# Patient Record
Sex: Female | Born: 1950
Health system: Southern US, Community
[De-identification: ages and names within clinical notes are randomized; demographics above are authoritative.]

## PROBLEM LIST (undated history)

## (undated) DIAGNOSIS — I10 Essential (primary) hypertension: Secondary | ICD-10-CM

## (undated) DIAGNOSIS — E785 Hyperlipidemia, unspecified: Secondary | ICD-10-CM

## (undated) DIAGNOSIS — J45909 Unspecified asthma, uncomplicated: Secondary | ICD-10-CM

## (undated) HISTORY — DX: Unspecified asthma, uncomplicated: J45.909

## (undated) HISTORY — DX: Essential (primary) hypertension: I10

## (undated) HISTORY — DX: Hyperlipidemia, unspecified: E78.5

## (undated) HISTORY — PX: ABDOMINAL HYSTERECTOMY: SHX81

---

## 2003-01-01 ENCOUNTER — Encounter: Payer: Self-pay | Admitting: Emergency Medicine

## 2003-01-01 ENCOUNTER — Emergency Department (HOSPITAL_COMMUNITY): Admission: EM | Admit: 2003-01-01 | Discharge: 2003-01-02 | Payer: Self-pay | Admitting: Emergency Medicine

## 2004-12-10 ENCOUNTER — Emergency Department (HOSPITAL_COMMUNITY): Admission: EM | Admit: 2004-12-10 | Discharge: 2004-12-10 | Payer: Self-pay | Admitting: Emergency Medicine

## 2007-09-11 ENCOUNTER — Emergency Department (HOSPITAL_COMMUNITY): Admission: EM | Admit: 2007-09-11 | Discharge: 2007-09-11 | Payer: Self-pay | Admitting: Emergency Medicine

## 2008-11-19 ENCOUNTER — Ambulatory Visit (HOSPITAL_COMMUNITY): Admission: RE | Admit: 2008-11-19 | Discharge: 2008-11-19 | Payer: Self-pay | Admitting: Orthopaedic Surgery

## 2009-04-26 ENCOUNTER — Ambulatory Visit (HOSPITAL_COMMUNITY): Admission: RE | Admit: 2009-04-26 | Discharge: 2009-04-26 | Payer: Self-pay | Admitting: Orthopaedic Surgery

## 2009-05-14 ENCOUNTER — Encounter: Admission: RE | Admit: 2009-05-14 | Discharge: 2009-07-30 | Payer: Self-pay | Admitting: Orthopaedic Surgery

## 2009-06-16 DIAGNOSIS — I8000 Phlebitis and thrombophlebitis of superficial vessels of unspecified lower extremity: Secondary | ICD-10-CM | POA: Insufficient documentation

## 2009-06-16 DIAGNOSIS — J45909 Unspecified asthma, uncomplicated: Secondary | ICD-10-CM | POA: Insufficient documentation

## 2009-06-16 DIAGNOSIS — R609 Edema, unspecified: Secondary | ICD-10-CM | POA: Insufficient documentation

## 2009-06-16 DIAGNOSIS — G479 Sleep disorder, unspecified: Secondary | ICD-10-CM | POA: Insufficient documentation

## 2009-06-16 DIAGNOSIS — M7750 Other enthesopathy of unspecified foot: Secondary | ICD-10-CM | POA: Insufficient documentation

## 2010-02-13 DIAGNOSIS — B351 Tinea unguium: Secondary | ICD-10-CM | POA: Insufficient documentation

## 2010-02-16 DIAGNOSIS — K5900 Constipation, unspecified: Secondary | ICD-10-CM | POA: Insufficient documentation

## 2011-02-09 LAB — CBC
HCT: 41.7 % (ref 36.0–46.0)
Hemoglobin: 14.3 g/dL (ref 12.0–15.0)
MCHC: 34.3 g/dL (ref 30.0–36.0)
MCV: 89.8 fL (ref 78.0–100.0)
Platelets: 296 10*3/uL (ref 150–400)
RBC: 4.64 MIL/uL (ref 3.87–5.11)
RDW: 14.2 % (ref 11.5–15.5)
WBC: 8.3 10*3/uL (ref 4.0–10.5)

## 2011-02-09 LAB — GLUCOSE, CAPILLARY: Glucose-Capillary: 190 mg/dL — ABNORMAL HIGH (ref 70–99)

## 2011-02-09 LAB — BASIC METABOLIC PANEL
BUN: 13 mg/dL (ref 6–23)
CO2: 29 mEq/L (ref 19–32)
Calcium: 9.6 mg/dL (ref 8.4–10.5)
Chloride: 100 mEq/L (ref 96–112)
Creatinine, Ser: 0.77 mg/dL (ref 0.4–1.2)
GFR calc Af Amer: >60 mL/min (ref >60)
GFR calc non Af Amer: >60 mL/min (ref >60)
Glucose, Bld: 93 mg/dL (ref 70–99)
Potassium: 3.9 mEq/L (ref 3.5–5.1)
Sodium: 139 mEq/L (ref 135–145)

## 2011-03-17 NOTE — Op Note (Signed)
Kelly Shannon, Kelly Shannon           ACCOUNT NO.:  [id number]   MEDICAL RECORD NO.:  [id number]          PATIENT TYPE:  AMB   LOCATION:  SDS                          FACILITY:  [facility]   PHYSICIAN:  Vanita Panda. Magnus Ivan, M.D.DATE OF BIRTH:  03-Aug-1951   DATE OF PROCEDURE:  04/26/2009  DATE OF DISCHARGE:  04/26/2009                               OPERATIVE REPORT   PREOPERATIVE DIAGNOSES:  Right shoulder impingement syndrome and full-  thickness rotator cuff tear.   POSTOPERATIVE DIAGNOSES:  Right shoulder impingement syndrome and full-  thickness rotator cuff tear.   PROCEDURE:  1. Right shoulder arthroscopy with debridement and subacromial      decompression.  2. Right shoulder arthroscopically assisted rotator cuff repair.   FINDINGS:  Full-thickness rotator cuff repair of supraspinatus tendon  and impingement syndrome.   SURGEON:  Vanita Panda. Magnus Ivan, MD   ANESTHESIA:  General.   ANTIBIOTICS:  Ancef 1 g IV.   BLOOD LOSS:  Minimal.   COMPLICATIONS:  None.   INDICATIONS:  Briefly, Ms. 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  She had had problems with right shoulder pain for some  time now.  She then had had a mechanical fall.  I had obtained an MRI  and it did show full-thickness minimally retracted rotator cuff tear.  I  recommended right shoulder arthroscopy with debridement, subacromial  decompression, and rotator cuff repair.  She had to delay this for a  while due to flare-ups of her fibromyalgia, but now presents to have  intervention of the shoulder.  This has been a quite painful shoulder to  her with limited motion as well and I felt that she was potentially  developing frozen shoulder, but this is difficult to tell in somewhat  with chronic pain.  The risks and benefits of surgery explained to her  in full length and she understood and agreed to proceed with  surgery.   DESCRIPTION OF PROCEDURE:  After informed consent was obtained, the  appropriate right shoulder was marked.  Anesthesia was obtained with  regional right shoulder block.  She was then brought to the operating  room and placed supine on the operating table.  General anesthesia was  then obtained.  She was then fashioned to a beach-chair position with  appropriate bending at the waist and knees.  There was appropriate  positioning of the head and neck and padding of the down nonoperative  left arm.  Her right arm was then prepped and draped with DuraPrep and  sterile drapes and placed in an in-line skeletal traction with 45  degrees of forward flexion and neutral rotation.  The 10- pounds of  traction were applied using the fishing pole traction device.  A time-  out was called and she was identified as the correct patient and correct  right shoulder.  I then made a posterolateral incision off the  posterolateral edge of the acromion, and the arthroscopic cannula was  inserted and the glenohumeral joint was entered.  You could right away  see  that there was undersurface tearing of the rotator cuff and it  looked like there was almost a full-thickness tear from the glenohumeral  joint.  There was frayed tissue around the labrum and on the anterior  aspect her shoulder inflamed tissue.  An anterior portal was then made  lateral to the coracoid process and soft tissue ablation was inserted.  I carried out a thorough debridement within the glenohumeral joint.  Next, I entered the subacromial space with the posterior portal and made  a separate lateral portal.  After using the soft tissue ablation wand  cleaning out the bursa, you could see that there was a full-thickness  rotator cuff tear with some 2-mm retraction.  At that standpoint, I  decided to perform an arthroscopic rotator cuff repair.  A second  lateral incision was made for suture management.  Two arthroscopic  cannulas  were inserted.  I then used a high-speed bur to clean off the  footprint of the rotator cuff and to get bleeding bone for good bleeding  base for the repair.  Next, a 5.5-mm Arthrex suture anchor was placed at  the greater tuberosity of the foot at the area of the footprint of the  rotator cuff.  Two strands of FiberWire suture came out from this.  Using the scorpion suture passer, I then passed 2 horizontal mattress  sutures through the front of the cuff and 2 through the posterior aspect  of cuff to have a stable construct.  I then tied these down with sliding  knot format.  I then placed a supplemental push lock in this subdeltoid  area of the proximal humerus to perform a double row technique to  supplement my repair.  I could easily visualize the shoulder through  internal and external rotation and the rotator cuff was lying down taut  to the bone.  I probed this as well and it was again found to be taut.  I then used a high-speed bur to perform a subacromial decompression with  giving adequate space for the shoulder in the rotator cuff to mobilize.  I then removed all instrumentation and closed all portal sites with  interrupted 3-0 nylon suture.  Xeroform followed by well-padded sterile  dressing was applied and the patient's arm was placed in a shoulder  abduction pillow sling.  She was then awakened, extubated, and taken to  the recovery room in stable condition.  All final counts were correct  and there were no complications noted.      Vanita Panda. Magnus Ivan, M.D.  Electronically Signed     CYB/MEDQ  D:  04/26/2009  T:  04/26/2009  Job:  [id number]

## 2011-08-11 DIAGNOSIS — M791 Myalgia, unspecified site: Secondary | ICD-10-CM | POA: Insufficient documentation

## 2011-08-11 DIAGNOSIS — I10 Essential (primary) hypertension: Secondary | ICD-10-CM | POA: Insufficient documentation

## 2011-08-11 DIAGNOSIS — F315 Bipolar disorder, current episode depressed, severe, with psychotic features: Secondary | ICD-10-CM | POA: Insufficient documentation

## 2011-08-11 DIAGNOSIS — E119 Type 2 diabetes mellitus without complications: Secondary | ICD-10-CM | POA: Insufficient documentation

## 2011-08-11 DIAGNOSIS — E7800 Pure hypercholesterolemia, unspecified: Secondary | ICD-10-CM | POA: Insufficient documentation

## 2011-08-27 ENCOUNTER — Emergency Department (HOSPITAL_COMMUNITY): Payer: No Typology Code available for payment source

## 2011-08-27 ENCOUNTER — Emergency Department (HOSPITAL_COMMUNITY)
Admission: EM | Admit: 2011-08-27 | Discharge: 2011-08-27 | Disposition: A | Discharge status: Home / Self Care | Payer: No Typology Code available for payment source | Attending: Emergency Medicine | Admitting: Emergency Medicine

## 2011-08-27 DIAGNOSIS — M546 Pain in thoracic spine: Secondary | ICD-10-CM | POA: Insufficient documentation

## 2011-08-27 DIAGNOSIS — M545 Low back pain, unspecified: Secondary | ICD-10-CM | POA: Insufficient documentation

## 2011-08-27 DIAGNOSIS — S335XXA Sprain of ligaments of lumbar spine, initial encounter: Secondary | ICD-10-CM | POA: Insufficient documentation

## 2011-08-27 DIAGNOSIS — S01501A Unspecified open wound of lip, initial encounter: Secondary | ICD-10-CM | POA: Insufficient documentation

## 2011-08-27 DIAGNOSIS — S139XXA Sprain of joints and ligaments of unspecified parts of neck, initial encounter: Secondary | ICD-10-CM | POA: Insufficient documentation

## 2011-08-27 DIAGNOSIS — E119 Type 2 diabetes mellitus without complications: Secondary | ICD-10-CM | POA: Insufficient documentation

## 2011-08-27 DIAGNOSIS — R109 Unspecified abdominal pain: Secondary | ICD-10-CM | POA: Insufficient documentation

## 2011-08-27 DIAGNOSIS — M25529 Pain in unspecified elbow: Secondary | ICD-10-CM | POA: Insufficient documentation

## 2011-08-27 DIAGNOSIS — I10 Essential (primary) hypertension: Secondary | ICD-10-CM | POA: Insufficient documentation

## 2011-08-27 DIAGNOSIS — R071 Chest pain on breathing: Secondary | ICD-10-CM | POA: Insufficient documentation

## 2011-08-27 DIAGNOSIS — R10819 Abdominal tenderness, unspecified site: Secondary | ICD-10-CM | POA: Insufficient documentation

## 2011-08-27 DIAGNOSIS — M542 Cervicalgia: Secondary | ICD-10-CM | POA: Insufficient documentation

## 2011-08-27 DIAGNOSIS — IMO0001 Reserved for inherently not codable concepts without codable children: Secondary | ICD-10-CM | POA: Insufficient documentation

## 2011-08-27 LAB — CBC
MCH: 28.8 pg (ref 26.0–34.0)
MCHC: 34 g/dL (ref 30.0–36.0)
MCV: 84.8 fL (ref 78.0–100.0)
Platelets: 247 10*3/uL (ref 150–400)
RDW: 14 % (ref 11.5–15.5)
WBC: 7.8 10*3/uL (ref 4.0–10.5)

## 2011-08-27 LAB — POCT I-STAT, CHEM 8
Calcium, Ion: 1.1 mmol/L — ABNORMAL LOW (ref 1.12–1.32)
HCT: 38 % (ref 36.0–46.0)
Hemoglobin: 12.9 g/dL (ref 12.0–15.0)
TCO2: 26 mmol/L (ref 0–100)

## 2011-08-27 LAB — DIFFERENTIAL
Eosinophils Absolute: 0.5 10*3/uL (ref 0.0–0.7)
Eosinophils Relative: 7 % — ABNORMAL HIGH (ref 0–5)
Lymphs Abs: 1.3 10*3/uL (ref 0.7–4.0)
Monocytes Absolute: 0.3 10*3/uL (ref 0.1–1.0)
Monocytes Relative: 4 % (ref 3–12)

## 2011-08-27 MED ORDER — IOHEXOL 300 MG/ML  SOLN: 100.0000 mL | Freq: Once | INTRAMUSCULAR | Status: DC | PRN

## 2011-09-15 DIAGNOSIS — N959 Unspecified menopausal and perimenopausal disorder: Secondary | ICD-10-CM | POA: Insufficient documentation

## 2011-09-22 DIAGNOSIS — F319 Bipolar disorder, unspecified: Secondary | ICD-10-CM | POA: Insufficient documentation

## 2011-09-22 DIAGNOSIS — IMO0001 Reserved for inherently not codable concepts without codable children: Secondary | ICD-10-CM | POA: Insufficient documentation

## 2011-09-22 DIAGNOSIS — N951 Menopausal and female climacteric states: Secondary | ICD-10-CM | POA: Insufficient documentation

## 2011-09-22 DIAGNOSIS — J45909 Unspecified asthma, uncomplicated: Secondary | ICD-10-CM | POA: Insufficient documentation

## 2011-09-22 DIAGNOSIS — J309 Allergic rhinitis, unspecified: Secondary | ICD-10-CM | POA: Insufficient documentation

## 2011-09-22 DIAGNOSIS — H6120 Impacted cerumen, unspecified ear: Secondary | ICD-10-CM | POA: Insufficient documentation

## 2011-12-30 ENCOUNTER — Encounter: Payer: No Typology Code available for payment source | Admitting: Physical Medicine and Rehabilitation

## 2012-02-10 ENCOUNTER — Encounter: Payer: 59 | Attending: Physical Medicine and Rehabilitation | Admitting: Physical Medicine and Rehabilitation

## 2012-02-10 ENCOUNTER — Encounter: Payer: Self-pay | Admitting: Physical Medicine and Rehabilitation

## 2012-02-10 VITALS — BP 140/81 | HR 87 | Resp 14 | Ht 64.0 in | Wt 163.0 lb

## 2012-02-10 DIAGNOSIS — M79609 Pain in unspecified limb: Secondary | ICD-10-CM | POA: Insufficient documentation

## 2012-02-10 DIAGNOSIS — M25569 Pain in unspecified knee: Secondary | ICD-10-CM | POA: Insufficient documentation

## 2012-02-10 DIAGNOSIS — R209 Unspecified disturbances of skin sensation: Secondary | ICD-10-CM | POA: Insufficient documentation

## 2012-02-10 DIAGNOSIS — M542 Cervicalgia: Secondary | ICD-10-CM | POA: Insufficient documentation

## 2012-02-10 DIAGNOSIS — G8929 Other chronic pain: Secondary | ICD-10-CM | POA: Insufficient documentation

## 2012-02-10 DIAGNOSIS — M25579 Pain in unspecified ankle and joints of unspecified foot: Secondary | ICD-10-CM | POA: Insufficient documentation

## 2012-02-10 DIAGNOSIS — M25561 Pain in right knee: Secondary | ICD-10-CM

## 2012-02-10 DIAGNOSIS — M25529 Pain in unspecified elbow: Secondary | ICD-10-CM | POA: Insufficient documentation

## 2012-02-10 MED ORDER — DICLOFENAC SODIUM 1 % TD GEL: 1.0000 "application " | Freq: Four times a day (QID) | TRANSDERMAL | Status: DC
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
## 2012-02-10 NOTE — Patient Instructions (Addendum)
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  I am scheduling you for x-rays of your neck and knees.  I would like to set up physical therapy for your elbows at our next visit  I would like to get you set up for evaluation for carpal tunnel syndrome  I prescribed in some diclofenec gel for your elbows  I will see you back in 3 weeks        Lateral Epicondylitis (Tennis Elbow) with Rehab Lateral epicondylitis involves inflammation and pain around the outer portion of the elbow. The pain is caused by inflammation of the tendons in the forearm that bring back (extend) the wrist. Lateral epicondylittis is also called tennis elbow, because it is very common in tennis players. However, it may occur in any individual who extends the wrist repetitively. If lateral epicondylitis is left untreated, it may become a chronic problem. SYMPTOMS   Pain, tenderness, and inflammation on the outer (lateral) side of the elbow.   Pain or weakness with gripping activities.   Pain that increases with wrist twisting motions (playing tennis, using a screwdriver, opening a door or a jar).   Pain with lifting objects, including a coffee cup.  CAUSES  Lateral epicondylitis is caused by inflammation of the tendons that extend the wrist. Causes of injury may include:  Repetitive stress and strain on the muscles and tendons that extend the wrist.   Sudden change in activity level or intensity.   Incorrect grip in racquet sports.   Incorrect grip size of racquet (often too large).   Incorrect hitting position or technique (usually backhand, leading with the elbow).   Using a racket that is too heavy.  RISK INCREASES WITH:  Sports or occupations that require repetitive and/or strenuous forearm and wrist movements (tennis, squash, racquetball, carpentry).   Poor wrist and forearm strength and flexibility.   Failure to warm up properly before activity.   Resuming  activity before healing, rehabilitation, and conditioning are complete.  PREVENTION   Warm up and stretch properly before activity.   Maintain physical fitness:   Strength, flexibility, and endurance.   Cardiovascular fitness.   Wear and use properly fitted equipment.   Learn and use proper technique and have a coach correct improper technique.   Wear a tennis elbow (counterforce) brace.  PROGNOSIS  The course of this condition depends on the degree of the injury. If treated properly, acute cases (symptoms lasting less than 4 weeks) are often resolved in 2 to 6 weeks. Chronic (longer lasting cases) often resolve in 3 to 6 months, but may require physical therapy. RELATED COMPLICATIONS   Frequently recurring symptoms, resulting in a chronic problem. Properly treating the problem the first time decreases frequency of recurrence.   Chronic inflammation, scarring tendon degeneration, and partial tendon tear, requiring surgery.   Delayed healing or resolution of symptoms.  TREATMENT  Treatment first involves the use of ice and medicine, to reduce pain and inflammation. Strengthening and stretching exercises may help reduce discomfort, if performed regularly. These exercises may be performed at home, if the condition is an acute injury. Chronic cases may require a referral to a physical therapist for evaluation and treatment. Your caregiver may advise a corticosteroid injection, to help reduce inflammation. Rarely, surgery is needed. MEDICATION  If pain medicine is needed, nonsteroidal anti-inflammatory medicines (aspirin and ibuprofen), or other minor pain relievers (acetaminophen), are often advised.   Do not take pain medicine for 7 days before surgery.  Prescription pain relievers may be given, if your caregiver thinks they are needed. Use only as directed and only as much as you need.   Corticosteroid injections may be recommended. These injections should be reserved only for the  most severe cases, because they can only be given a certain number of times.  HEAT AND COLD  Cold treatment (icing) should be applied for 10 to 15 minutes every 2 to 3 hours for inflammation and pain, and immediately after activity that aggravates your symptoms. Use ice packs or an ice massage.   Heat treatment may be used before performing stretching and strengthening activities prescribed by your caregiver, physical therapist, or athletic trainer. Use a heat pack or a warm water soak.  SEEK MEDICAL CARE IF: Symptoms get worse or do not improve in 2 weeks, despite treatment. EXERCISES  RANGE OF MOTION (ROM) AND STRETCHING EXERCISES - Epicondylitis, Lateral (Tennis Elbow) These exercises may help you when beginning to rehabilitate your injury. Your symptoms may go away with or without further involvement from your physician, physical therapist or athletic trainer. While completing these exercises, remember:   Restoring tissue flexibility helps normal motion to return to the joints. This allows healthier, less painful movement and activity.   An effective stretch should be held for at least 30 seconds.   A stretch should never be painful. You should only feel a gentle lengthening or release in the stretched tissue.  RANGE OF MOTION - Wrist Flexion, Active-Assisted  Extend your right / left elbow with your fingers pointing down.*   Gently pull the back of your hand towards you, until you feel a gentle stretch on the top of your forearm.   Hold this position for __________ seconds.  Repeat __________ times. Complete this exercise __________ times per day.  *If directed by your physician, physical therapist or athletic trainer, complete this stretch with your elbow bent, rather than extended. RANGE OF MOTION - Wrist Extension, Active-Assisted  Extend your right / left elbow and turn your palm upwards.*   Gently pull your palm and fingertips back, so your wrist extends and your fingers point  more toward the ground.   You should feel a gentle stretch on the inside of your forearm.   Hold this position for __________ seconds.  Repeat __________ times. Complete this exercise __________ times per day. *If directed by your physician, physical therapist or athletic trainer, complete this stretch with your elbow bent, rather than extended. STRETCH - Wrist Flexion  Place the back of your right / left hand on a tabletop, leaving your elbow slightly bent. Your fingers should point away from your body.   Gently press the back of your hand down onto the table by straightening your elbow. You should feel a stretch on the top of your forearm.   Hold this position for __________ seconds.  Repeat __________ times. Complete this stretch __________ times per day.  STRETCH - Wrist Extension   Place your right / left fingertips on a tabletop, leaving your elbow slightly bent. Your fingers should point backwards.   Gently press your fingers and palm down onto the table by straightening your elbow. You should feel a stretch on the inside of your forearm.   Hold this position for __________ seconds.  Repeat __________ times. Complete this stretch __________ times per day.  STRENGTHENING EXERCISES - Epicondylitis, Lateral (Tennis Elbow) These exercises may help you when beginning to rehabilitate your injury. They may resolve your symptoms with or without further involvement  from your physician, physical therapist or athletic trainer. While completing these exercises, remember:   Muscles can gain both the endurance and the strength needed for everyday activities through controlled exercises.   Complete these exercises as instructed by your physician, physical therapist or athletic trainer. Increase the resistance and repetitions only as guided.   You may experience muscle soreness or fatigue, but the pain or discomfort you are trying to eliminate should never worsen during these exercises. If this  pain does get worse, stop and make sure you are following the directions exactly. If the pain is still present after adjustments, discontinue the exercise until you can discuss the trouble with your caregiver.  STRENGTH - Wrist Flexors  Sit with your right / left forearm palm-up and fully supported on a table or countertop. Your elbow should be resting below the height of your shoulder. Allow your wrist to extend over the edge of the surface.   Loosely holding a __________ weight, or a piece of rubber exercise band or tubing, slowly curl your hand up toward your forearm.   Hold this position for __________ seconds. Slowly lower the wrist back to the starting position in a controlled manner.  Repeat __________ times. Complete this exercise __________ times per day.  STRENGTH - Wrist Extensors  Sit with your right / left forearm palm-down and fully supported on a table or countertop. Your elbow should be resting below the height of your shoulder. Allow your wrist to extend over the edge of the surface.   Loosely holding a __________ weight, or a piece of rubber exercise band or tubing, slowly curl your hand up toward your forearm.   Hold this position for __________ seconds. Slowly lower the wrist back to the starting position in a controlled manner.  Repeat __________ times. Complete this exercise __________ times per day.  STRENGTH - Ulnar Deviators  Stand with a ____________________ weight in your right / left hand, or sit while holding a rubber exercise band or tubing, with your healthy arm supported on a table or countertop.   Move your wrist, so that your pinkie travels toward your forearm and your thumb moves away from your forearm.   Hold this position for __________ seconds and then slowly lower the wrist back to the starting position.  Repeat __________ times. Complete this exercise __________ times per day STRENGTH - Radial Deviators  Stand with a ____________________ weight in  your right / left hand, or sit while holding a rubber exercise band or tubing, with your injured arm supported on a table or countertop.   Raise your hand upward in front of you or pull up on the rubber tubing.   Hold this position for __________ seconds and then slowly lower the wrist back to the starting position.  Repeat __________ times. Complete this exercise __________ times per day. STRENGTH - Forearm Supinators   Sit with your right / left forearm supported on a table, keeping your elbow below shoulder height. Rest your hand over the edge, palm down.   Gently grip a hammer or a soup ladle.   Without moving your elbow, slowly turn your palm and hand upward to a "thumbs-up" position.   Hold this position for __________ seconds. Slowly return to the starting position.  Repeat __________ times. Complete this exercise __________ times per day.  STRENGTH - Forearm Pronators   Sit with your right / left forearm supported on a table, keeping your elbow below shoulder height. Rest your hand over the  edge, palm up.   Gently grip a hammer or a soup ladle.   Without moving your elbow, slowly turn your palm and hand upward to a "thumbs-up" position.   Hold this position for __________ seconds. Slowly return to the starting position.  Repeat __________ times. Complete this exercise __________ times per day.  STRENGTH - Grip  Grasp a tennis ball, a dense sponge, or a large, rolled sock in your hand.   Squeeze as hard as you can, without increasing any pain.   Hold this position for __________ seconds. Release your grip slowly.  Repeat __________ times. Complete this exercise __________ times per day.  STRENGTH - Elbow Extensors, Isometric  Stand or sit upright, on a firm surface. Place your right / left arm so that your palm faces your stomach, and it is at the height of your waist.   Place your opposite hand on the underside of your forearm. Gently push up as your right / left arm  resists. Push as hard as you can with both arms, without causing any pain or movement at your right / left elbow. Hold this stationary position for __________ seconds.  Gradually release the tension in both arms. Allow your muscles to relax completely before repeating. Document Released: 10/19/2005 Document Revised: 10/08/2011 Document Reviewed: 01/31/2009 [facility] Patient Information 2012 [place], [place].

## 2012-02-11 ENCOUNTER — Encounter: Payer: Self-pay | Admitting: Physical Medicine and Rehabilitation

## 2012-02-17 ENCOUNTER — Encounter: Payer: Self-pay | Admitting: Physical Medicine and Rehabilitation

## 2012-03-08 DIAGNOSIS — R011 Cardiac murmur, unspecified: Secondary | ICD-10-CM | POA: Insufficient documentation

## 2012-03-11 ENCOUNTER — Encounter: Payer: 59 | Attending: Physical Medicine and Rehabilitation | Admitting: Physical Medicine and Rehabilitation

## 2012-03-11 DIAGNOSIS — M79609 Pain in unspecified limb: Secondary | ICD-10-CM | POA: Insufficient documentation

## 2012-03-11 DIAGNOSIS — M25529 Pain in unspecified elbow: Secondary | ICD-10-CM | POA: Insufficient documentation

## 2012-03-11 DIAGNOSIS — M25569 Pain in unspecified knee: Secondary | ICD-10-CM | POA: Insufficient documentation

## 2012-03-11 DIAGNOSIS — M542 Cervicalgia: Secondary | ICD-10-CM | POA: Insufficient documentation

## 2012-03-11 DIAGNOSIS — M25579 Pain in unspecified ankle and joints of unspecified foot: Secondary | ICD-10-CM | POA: Insufficient documentation

## 2012-03-11 DIAGNOSIS — R209 Unspecified disturbances of skin sensation: Secondary | ICD-10-CM | POA: Insufficient documentation

## 2012-03-11 DIAGNOSIS — G8929 Other chronic pain: Secondary | ICD-10-CM | POA: Insufficient documentation

## 2013-04-08 DIAGNOSIS — F411 Generalized anxiety disorder: Secondary | ICD-10-CM | POA: Insufficient documentation

## 2013-11-29 DIAGNOSIS — F605 Obsessive-compulsive personality disorder: Secondary | ICD-10-CM | POA: Insufficient documentation

## 2015-01-18 DIAGNOSIS — G4710 Hypersomnia, unspecified: Secondary | ICD-10-CM | POA: Insufficient documentation

## 2015-01-18 DIAGNOSIS — R5383 Other fatigue: Secondary | ICD-10-CM | POA: Insufficient documentation

## 2015-01-18 DIAGNOSIS — D649 Anemia, unspecified: Secondary | ICD-10-CM | POA: Insufficient documentation

## 2015-01-18 DIAGNOSIS — G723 Periodic paralysis: Secondary | ICD-10-CM | POA: Insufficient documentation

## 2015-04-17 DIAGNOSIS — Z23 Encounter for immunization: Secondary | ICD-10-CM | POA: Insufficient documentation

## 2015-09-18 DIAGNOSIS — J320 Chronic maxillary sinusitis: Secondary | ICD-10-CM | POA: Insufficient documentation

## 2015-10-01 DIAGNOSIS — J322 Chronic ethmoidal sinusitis: Secondary | ICD-10-CM | POA: Insufficient documentation

## 2015-10-30 DIAGNOSIS — J0100 Acute maxillary sinusitis, unspecified: Secondary | ICD-10-CM | POA: Insufficient documentation

## 2015-11-05 MED FILL — levoFLOXacin 500 MG TABS: 500 | 20 days supply | Qty: 20 | Fill #0

## 2015-11-18 DIAGNOSIS — M545 Low back pain: Secondary | ICD-10-CM | POA: Diagnosis not present

## 2015-11-18 DIAGNOSIS — M542 Cervicalgia: Secondary | ICD-10-CM | POA: Diagnosis not present

## 2015-11-18 DIAGNOSIS — G894 Chronic pain syndrome: Secondary | ICD-10-CM | POA: Diagnosis not present

## 2015-11-18 DIAGNOSIS — M5416 Radiculopathy, lumbar region: Secondary | ICD-10-CM | POA: Diagnosis not present

## 2015-11-18 DIAGNOSIS — M797 Fibromyalgia: Secondary | ICD-10-CM | POA: Diagnosis not present

## 2015-11-18 DIAGNOSIS — M533 Sacrococcygeal disorders, not elsewhere classified: Secondary | ICD-10-CM | POA: Diagnosis not present

## 2015-11-18 MED FILL — DULoxetine HCL 30 MG CPEP: 30 | 30 days supply | Qty: 30 | Fill #0

## 2015-11-18 MED FILL — BACLOFEN 10 MG TABLET: 10 | 90 days supply | Qty: 360 | Fill #0

## 2015-11-20 DIAGNOSIS — M5416 Radiculopathy, lumbar region: Secondary | ICD-10-CM | POA: Diagnosis not present

## 2015-11-26 DIAGNOSIS — N959 Unspecified menopausal and perimenopausal disorder: Secondary | ICD-10-CM | POA: Diagnosis not present

## 2015-11-26 DIAGNOSIS — E1165 Type 2 diabetes mellitus with hyperglycemia: Secondary | ICD-10-CM | POA: Diagnosis not present

## 2015-11-26 DIAGNOSIS — E1121 Type 2 diabetes mellitus with diabetic nephropathy: Secondary | ICD-10-CM | POA: Diagnosis not present

## 2015-11-26 DIAGNOSIS — I10 Essential (primary) hypertension: Secondary | ICD-10-CM | POA: Diagnosis not present

## 2015-11-26 DIAGNOSIS — E7800 Pure hypercholesterolemia, unspecified: Secondary | ICD-10-CM | POA: Diagnosis not present

## 2015-11-26 DIAGNOSIS — D649 Anemia, unspecified: Secondary | ICD-10-CM | POA: Diagnosis not present

## 2015-11-29 DIAGNOSIS — G4710 Hypersomnia, unspecified: Secondary | ICD-10-CM | POA: Diagnosis not present

## 2015-11-29 DIAGNOSIS — Z1211 Encounter for screening for malignant neoplasm of colon: Secondary | ICD-10-CM | POA: Diagnosis not present

## 2015-11-29 DIAGNOSIS — G8929 Other chronic pain: Secondary | ICD-10-CM | POA: Diagnosis not present

## 2015-11-29 DIAGNOSIS — I10 Essential (primary) hypertension: Secondary | ICD-10-CM | POA: Diagnosis not present

## 2015-11-29 DIAGNOSIS — D72829 Elevated white blood cell count, unspecified: Secondary | ICD-10-CM | POA: Diagnosis not present

## 2015-11-29 DIAGNOSIS — E1100 Type 2 diabetes mellitus with hyperosmolarity without nonketotic hyperglycemic-hyperosmolar coma (NKHHC): Secondary | ICD-10-CM | POA: Diagnosis not present

## 2015-11-29 DIAGNOSIS — Z79899 Other long term (current) drug therapy: Secondary | ICD-10-CM | POA: Insufficient documentation

## 2015-11-29 DIAGNOSIS — N959 Unspecified menopausal and perimenopausal disorder: Secondary | ICD-10-CM | POA: Diagnosis not present

## 2015-11-29 DIAGNOSIS — N289 Disorder of kidney and ureter, unspecified: Secondary | ICD-10-CM | POA: Diagnosis not present

## 2015-11-29 DIAGNOSIS — E7800 Pure hypercholesterolemia, unspecified: Secondary | ICD-10-CM | POA: Diagnosis not present

## 2015-11-29 MED FILL — PIOGLITAZONE HCL 15 MG TAB: 15 | 90 days supply | Qty: 90 | Fill #0

## 2015-12-03 ENCOUNTER — Other Ambulatory Visit: Payer: Self-pay | Admitting: Pediatrics

## 2015-12-03 MED FILL — LINZESS 290 MCG CAPSULE: 290 | 30 days supply | Qty: 30 | Fill #3

## 2015-12-04 MED FILL — FUROSEMIDE 40 MG TABLET: 40 | 30 days supply | Qty: 30 | Fill #0

## 2015-12-06 MED FILL — MONTELUKAST SOD 10 MG TAB: 10 | 30 days supply | Qty: 30 | Fill #0

## 2015-12-09 MED FILL — FLUoxetine HCL 20 MG CAPS: 20 | 90 days supply | Qty: 270 | Fill #1

## 2015-12-15 DIAGNOSIS — M5442 Lumbago with sciatica, left side: Secondary | ICD-10-CM | POA: Diagnosis not present

## 2015-12-19 DIAGNOSIS — M797 Fibromyalgia: Secondary | ICD-10-CM | POA: Diagnosis not present

## 2015-12-19 DIAGNOSIS — G894 Chronic pain syndrome: Secondary | ICD-10-CM | POA: Diagnosis not present

## 2015-12-19 DIAGNOSIS — G43009 Migraine without aura, not intractable, without status migrainosus: Secondary | ICD-10-CM | POA: Diagnosis not present

## 2015-12-19 DIAGNOSIS — M5126 Other intervertebral disc displacement, lumbar region: Secondary | ICD-10-CM | POA: Insufficient documentation

## 2015-12-19 DIAGNOSIS — S32512A Fracture of superior rim of left pubis, initial encounter for closed fracture: Secondary | ICD-10-CM | POA: Diagnosis not present

## 2015-12-19 DIAGNOSIS — M25552 Pain in left hip: Secondary | ICD-10-CM | POA: Diagnosis not present

## 2015-12-19 DIAGNOSIS — E785 Hyperlipidemia, unspecified: Secondary | ICD-10-CM | POA: Insufficient documentation

## 2015-12-19 DIAGNOSIS — M5416 Radiculopathy, lumbar region: Secondary | ICD-10-CM | POA: Diagnosis not present

## 2015-12-19 DIAGNOSIS — M533 Sacrococcygeal disorders, not elsewhere classified: Secondary | ICD-10-CM | POA: Insufficient documentation

## 2015-12-19 DIAGNOSIS — G8929 Other chronic pain: Secondary | ICD-10-CM | POA: Diagnosis not present

## 2015-12-19 DIAGNOSIS — W19XXXA Unspecified fall, initial encounter: Secondary | ICD-10-CM | POA: Diagnosis not present

## 2015-12-19 DIAGNOSIS — M25559 Pain in unspecified hip: Secondary | ICD-10-CM | POA: Insufficient documentation

## 2015-12-19 DIAGNOSIS — M5441 Lumbago with sciatica, right side: Secondary | ICD-10-CM | POA: Diagnosis not present

## 2015-12-19 DIAGNOSIS — M545 Low back pain, unspecified: Secondary | ICD-10-CM | POA: Insufficient documentation

## 2015-12-19 DIAGNOSIS — I10 Essential (primary) hypertension: Secondary | ICD-10-CM | POA: Insufficient documentation

## 2015-12-19 DIAGNOSIS — E119 Type 2 diabetes mellitus without complications: Secondary | ICD-10-CM | POA: Insufficient documentation

## 2015-12-19 DIAGNOSIS — G4700 Insomnia, unspecified: Secondary | ICD-10-CM | POA: Insufficient documentation

## 2015-12-19 DIAGNOSIS — M5442 Lumbago with sciatica, left side: Secondary | ICD-10-CM | POA: Diagnosis not present

## 2015-12-19 DIAGNOSIS — S32592A Other specified fracture of left pubis, initial encounter for closed fracture: Secondary | ICD-10-CM | POA: Diagnosis not present

## 2015-12-19 DIAGNOSIS — M542 Cervicalgia: Secondary | ICD-10-CM | POA: Insufficient documentation

## 2015-12-27 MED FILL — traMADol HCL 50 MG TABS: 50 | 30 days supply | Qty: 90 | Fill #0

## 2015-12-27 MED FILL — DULoxetine HCL 30 MG CPEP: 30 | 30 days supply | Qty: 30 | Fill #1

## 2016-01-07 MED FILL — MONTELUKAST SOD 10 MG TAB: 10 | 30 days supply | Qty: 30 | Fill #1

## 2016-01-07 MED FILL — GABAPENTIN 300 MG CAPSULE: 300 | 90 days supply | Qty: 540 | Fill #0

## 2016-01-08 MED FILL — HYDROCODON-APAP 5-325: 5-325 | 15 days supply | Qty: 45 | Fill #0

## 2016-01-10 MED FILL — tiZANidine HCL 6 MG CAPS: 6 | 90 days supply | Qty: 270 | Fill #0

## 2016-01-13 MED FILL — SIMVASTATIN 80 MG TABLET: 80 | 90 days supply | Qty: 90 | Fill #0

## 2016-01-17 MED FILL — TOPIRAMATE 25 MG TABLET: 25 | 90 days supply | Qty: 360 | Fill #2

## 2016-01-23 MED FILL — clonazePAM 1 MG TABS: 1 | 30 days supply | Qty: 60 | Fill #0

## 2016-01-31 MED FILL — LINZESS 290 MCG CAPSULE: 290 | 30 days supply | Qty: 30 | Fill #4

## 2016-01-31 MED FILL — DULoxetine HCL 30 MG CPEP: 30 | 30 days supply | Qty: 30 | Fill #2

## 2016-02-05 MED FILL — QUETIAPINE FUMARATE 400 MG: 400 | 90 days supply | Qty: 180 | Fill #1

## 2016-02-05 MED FILL — MONTELUKAST SOD 10 MG TAB: 10 | 30 days supply | Qty: 30 | Fill #2

## 2016-02-07 DIAGNOSIS — R635 Abnormal weight gain: Secondary | ICD-10-CM | POA: Diagnosis not present

## 2016-02-07 DIAGNOSIS — N951 Menopausal and female climacteric states: Secondary | ICD-10-CM | POA: Diagnosis not present

## 2016-02-10 DIAGNOSIS — M47816 Spondylosis without myelopathy or radiculopathy, lumbar region: Secondary | ICD-10-CM | POA: Diagnosis not present

## 2016-02-10 DIAGNOSIS — S32602A Unspecified fracture of left ischium, initial encounter for closed fracture: Secondary | ICD-10-CM | POA: Diagnosis not present

## 2016-02-10 DIAGNOSIS — M5442 Lumbago with sciatica, left side: Secondary | ICD-10-CM | POA: Diagnosis not present

## 2016-02-10 DIAGNOSIS — M5440 Lumbago with sciatica, unspecified side: Secondary | ICD-10-CM | POA: Insufficient documentation

## 2016-02-11 DIAGNOSIS — S32602A Unspecified fracture of left ischium, initial encounter for closed fracture: Secondary | ICD-10-CM | POA: Diagnosis not present

## 2016-02-11 DIAGNOSIS — M25552 Pain in left hip: Secondary | ICD-10-CM | POA: Diagnosis not present

## 2016-02-13 DIAGNOSIS — R7309 Other abnormal glucose: Secondary | ICD-10-CM | POA: Diagnosis not present

## 2016-02-13 DIAGNOSIS — E559 Vitamin D deficiency, unspecified: Secondary | ICD-10-CM | POA: Diagnosis not present

## 2016-02-13 DIAGNOSIS — N958 Other specified menopausal and perimenopausal disorders: Secondary | ICD-10-CM | POA: Diagnosis not present

## 2016-02-18 ENCOUNTER — Ambulatory Visit (INDEPENDENT_AMBULATORY_CARE_PROVIDER_SITE_OTHER): Payer: 59 | Admitting: Pediatrics

## 2016-02-18 ENCOUNTER — Encounter: Payer: Self-pay | Admitting: Pediatrics

## 2016-02-18 VITALS — BP 126/70 | HR 86 | Temp 97.9°F | Resp 18 | Ht 64.17 in | Wt 176.4 lb

## 2016-02-18 DIAGNOSIS — J45901 Unspecified asthma with (acute) exacerbation: Secondary | ICD-10-CM | POA: Insufficient documentation

## 2016-02-18 DIAGNOSIS — J069 Acute upper respiratory infection, unspecified: Secondary | ICD-10-CM | POA: Diagnosis not present

## 2016-02-18 DIAGNOSIS — J3089 Other allergic rhinitis: Secondary | ICD-10-CM | POA: Diagnosis not present

## 2016-02-18 DIAGNOSIS — F315 Bipolar disorder, current episode depressed, severe, with psychotic features: Secondary | ICD-10-CM | POA: Diagnosis not present

## 2016-02-18 DIAGNOSIS — J4531 Mild persistent asthma with (acute) exacerbation: Secondary | ICD-10-CM

## 2016-02-18 DIAGNOSIS — B9789 Other viral agents as the cause of diseases classified elsewhere: Secondary | ICD-10-CM | POA: Insufficient documentation

## 2016-02-18 LAB — PULMONARY FUNCTION TEST

## 2016-02-18 MED ORDER — MONTELUKAST SODIUM 10 MG PO TABS: 10.0000 mg | ORAL_TABLET | Freq: Every day | ORAL | Status: DC

## 2016-02-18 MED ORDER — FLUTICASONE PROPIONATE 50 MCG/ACT NA SUSP: NASAL | Status: DC

## 2016-02-18 MED ORDER — ALBUTEROL SULFATE HFA 108 (90 BASE) MCG/ACT IN AERS: 2.0000 | INHALATION_SPRAY | RESPIRATORY_TRACT | Status: DC | PRN

## 2016-02-18 MED FILL — VENTOLIN HFA 90 MCG INHALER: 108 (90 BAS | 17 days supply | Qty: 18 | Fill #0

## 2016-02-18 MED FILL — FLUTICASONE PROP 50 MCG SPR: 50 | 30 days supply | Qty: 16 | Fill #0

## 2016-02-18 NOTE — Progress Notes (Signed)
[address] [place] [address] Dept: [phone]  FOLLOW UP NOTE  Patient ID: Kelly Shannon, female    DOB: [birth year]  Age: 65 y.o. MRN: [id number] Date of Office Visit: 02/18/2016  Assessment Chief Complaint: Wheezing and Cough  HPI Kelly Shannon presents for evaluation of coughing and wheezing for about 2 weeks. She had a cold 2 weeks ago and developed a cough. She had some wheezing 4 days ago and then 2 days ago. She has had some shortness of breath. She avoids peanuts because they have given her swelling of the mouth in the past, and does not want  EpiPen. Her skin testing to peanut was negative Her diabetes is well controlled  Current medications Ventolin 2 puffs every 4 hours if needed, montelukast  10 mg once a day, fluticasone 2 sprays per nostril once a day if needed. Her other medications are outlined in the chart   Drug Allergies:  Allergies  Allergen Reactions  . Lyrica [Pregabalin] Swelling  . Peanut Oil Other (See Comments)    Tongue feel funny  . Penicillins Other (See Comments)    Dental problems  . Sulfa Antibiotics   . Sulfamethoxazole-Trimethoprim Hives    Physical Exam: BP 126/70 mmHg  Pulse 86  Temp(Src) 97.9 F (36.6 C) (Oral)  Resp 18  Ht 5' 4.17" (1.63 m)  Wt 176 lb 6.4 oz (80.015 kg)  BMI 30.12 kg/m2  SpO2 97%   Physical Exam  Constitutional: She is oriented to person, place, and time. She appears well-developed and well-nourished.  HENT:  Eyes normal. Ears normal. Nose mild swelling of his turbinates. Pharynx normal.  Neck: Neck supple.  Cardiovascular:  S1 and S2 normal no murmurs  Pulmonary/Chest:  Clear to percussion and auscultation  Lymphadenopathy:    She has no cervical adenopathy.  Neurological: She is alert and oriented to person, place, and time.  Psychiatric: She has a normal mood and affect. Her behavior is normal. Judgment and thought content normal.  Vitals reviewed.   Diagnostics:  FVC 2.57 L FEV1  2.08 L. Predicted FVC 3.27 L predicted FEV1 2.52 L-this shows a mild reduction in the forced vital capacity  Assessment and Plan: 1. Asthma with acute exacerbation, mild persistent   2. Other allergic rhinitis   3. Viral URI with cough   4.      Diabetes well controlled  Meds ordered this encounter  Medications  . albuterol (VENTOLIN HFA) 108 (90 Base) MCG/ACT inhaler    Sig: Inhale 2 puffs into the lungs every 4 (four) hours as needed for wheezing or shortness of breath.    Dispense:  8 g    Refill:  1  . fluticasone (FLONASE) 50 MCG/ACT nasal spray    Sig: TWO SPRAYS EACH NOSTRIL ONCE A DAY FOR NASAL CONGESTION OR DRAINAGE    Dispense:  16 g    Refill:  5  . montelukast (SINGULAIR) 10 MG tablet    Sig: Take 1 tablet (10 mg total) by mouth at bedtime.    Dispense:  30 tablet    Refill:  5    Patient Instructions  Continue on your current medications Add prednisone 10 mg twice a day for 4 days, 10 mg on the fifth day Call me if you're not doing well on this treatment plan    Return in about 1 year (around 02/17/2017).    Thank you for the opportunity to care for this patient.  Please do not hesitate to contact me with questions.  Tonette BihariJ. A. Clearence Vitug, M.D.  [facility]  [address] [place], [place] [address] [phone]

## 2016-02-18 NOTE — Patient Instructions (Signed)
Continue on your current medications Add prednisone 10 mg twice a day for 4 days, 10 mg on the fifth day Call me if you're not doing well on this treatment plan

## 2016-02-20 MED FILL — clonazePAM 1 MG TABS: 1 | 30 days supply | Qty: 60 | Fill #0

## 2016-02-26 DIAGNOSIS — M6281 Muscle weakness (generalized): Secondary | ICD-10-CM | POA: Diagnosis not present

## 2016-02-26 DIAGNOSIS — M25652 Stiffness of left hip, not elsewhere classified: Secondary | ICD-10-CM | POA: Diagnosis not present

## 2016-02-26 DIAGNOSIS — M25552 Pain in left hip: Secondary | ICD-10-CM | POA: Diagnosis not present

## 2016-02-27 DIAGNOSIS — E538 Deficiency of other specified B group vitamins: Secondary | ICD-10-CM | POA: Diagnosis not present

## 2016-02-27 DIAGNOSIS — R7309 Other abnormal glucose: Secondary | ICD-10-CM | POA: Diagnosis not present

## 2016-02-27 DIAGNOSIS — E669 Obesity, unspecified: Secondary | ICD-10-CM | POA: Diagnosis not present

## 2016-02-27 MED FILL — DULoxetine HCL 30 MG CPEP: 30 | 30 days supply | Qty: 30 | Fill #3

## 2016-03-02 MED FILL — MONTELUKAST SOD 10 MG TAB: 10 | 30 days supply | Qty: 30 | Fill #3

## 2016-03-09 DIAGNOSIS — R7309 Other abnormal glucose: Secondary | ICD-10-CM | POA: Diagnosis not present

## 2016-03-09 DIAGNOSIS — E538 Deficiency of other specified B group vitamins: Secondary | ICD-10-CM | POA: Diagnosis not present

## 2016-03-09 DIAGNOSIS — E559 Vitamin D deficiency, unspecified: Secondary | ICD-10-CM | POA: Diagnosis not present

## 2016-03-09 DIAGNOSIS — E669 Obesity, unspecified: Secondary | ICD-10-CM | POA: Diagnosis not present

## 2016-03-09 MED FILL — FLUoxetine HCL 20 MG CAPS: 20 | 90 days supply | Qty: 270 | Fill #0

## 2016-03-09 MED FILL — PIOGLITAZONE HCL 15 MG TAB: 15 | 90 days supply | Qty: 90 | Fill #1

## 2016-03-11 MED FILL — metFORMIN HCL 850 MG TABS: 850 | 90 days supply | Qty: 180 | Fill #1

## 2016-03-11 MED FILL — traMADol HCL 50 MG TABS: 50 | 30 days supply | Qty: 90 | Fill #1

## 2016-03-23 MED FILL — LINZESS 290 MCG CAPSULE: 290 | 30 days supply | Qty: 30 | Fill #5

## 2016-03-23 MED FILL — clonazePAM 1 MG TABS: 1 | 30 days supply | Qty: 60 | Fill #1

## 2016-03-31 MED FILL — GABAPENTIN 300 MG CAPSULE: 300 | 90 days supply | Qty: 540 | Fill #1

## 2016-03-31 MED FILL — MONTELUKAST SOD 10 MG TAB: 10 | 30 days supply | Qty: 30 | Fill #4

## 2016-03-31 MED FILL — DULoxetine HCL 30 MG CPEP: 30 | 30 days supply | Qty: 30 | Fill #4

## 2016-04-09 DIAGNOSIS — H524 Presbyopia: Secondary | ICD-10-CM | POA: Diagnosis not present

## 2016-04-13 MED FILL — tiZANidine HCL 6 MG CAPS: 6 | 90 days supply | Qty: 270 | Fill #1

## 2016-04-20 MED FILL — clonazePAM 1 MG TABS: 1 | 30 days supply | Qty: 60 | Fill #2

## 2016-04-20 MED FILL — TOPIRAMATE 25 MG TABLET: 25 | 90 days supply | Qty: 360 | Fill #3

## 2016-04-20 MED FILL — SIMVASTATIN 80 MG TABLET: 80 | 90 days supply | Qty: 90 | Fill #1

## 2016-04-21 ENCOUNTER — Encounter: Payer: Self-pay | Admitting: Pediatrics

## 2016-04-21 ENCOUNTER — Ambulatory Visit (INDEPENDENT_AMBULATORY_CARE_PROVIDER_SITE_OTHER): Payer: 59 | Admitting: Pediatrics

## 2016-04-21 VITALS — BP 106/62 | HR 82 | Temp 98.0°F | Resp 16

## 2016-04-21 DIAGNOSIS — J3089 Other allergic rhinitis: Secondary | ICD-10-CM | POA: Diagnosis not present

## 2016-04-21 DIAGNOSIS — J4530 Mild persistent asthma, uncomplicated: Secondary | ICD-10-CM

## 2016-04-21 NOTE — Progress Notes (Signed)
  [address] [place] [address] Dept: [phone]  FOLLOW UP NOTE  Patient ID: Kelly BastaChristine Smick, female    DOB: 11/28/1950  Age: 65 y.o. MRN: [id number] Date of Office Visit: 04/21/2016  Assessment Chief Complaint: Follow-up  HPI Kelly Shannon presents for follow-up of asthma and allergic rhinitis. She has recently noted that she becomes very short of breath if she walks. She is not having coughing and wheezing. Her asthma is well controlled. Her nasal congestion is well  controlled.  Current medications loratadine 10 mg once a day, montelukast 10 mg once a day, fluticasone 1 spray per nostril twice a day if needed , Ventolin 2 puffs every 4 hours if needed. Her other medications are outlined in the chart.   Drug Allergies:  Allergies  Allergen Reactions  . Lyrica [Pregabalin] Swelling  . Peanut Oil Other (See Comments)    Tongue feel funny  . Penicillins Other (See Comments)    Dental problems  . Sulfa Antibiotics   . Sulfamethoxazole-Trimethoprim Hives    Physical Exam: BP 106/62 mmHg  Pulse 82  Temp(Src) 98 F (36.7 C) (Oral)  Resp 16   Physical Exam  Constitutional: She is oriented to person, place, and time. She appears well-developed and well-nourished.  HENT:  Eyes normal. Ears normal. Nose normal. Pharynx normal.  Neck: Neck supple.  Cardiovascular:  S1 and S2 normal no murmurs  Pulmonary/Chest:  Clear to percussion and auscultation  Lymphadenopathy:    She has no cervical adenopathy.  Neurological: She is alert and oriented to person, place, and time.  Psychiatric: She has a normal mood and affect. Her behavior is normal. Judgment and thought content normal.  Vitals reviewed.   Diagnostics:  FVC 2.54 L FEV1 2.25 L. Predicted FVC 3.7 L predicted FEV1 2.52 L-this shows minimal reduction in the forced vital capacity but no airway obstruction  Assessment and Plan: 1. Mild persistent asthma, uncomplicated   2. Other allergic rhinitis   3.      Diabetes well controlled      Patient Instructions  Claritin 10 mg once a day for runny nose Montelukast  10 mg once a day for coughing or wheezing Ventolin 2 puffs every 4 hours if needed for wheezing or coughing spells Fluticasone 1 spray per nostril twice a day if needed for stuffy nose  Your  shortness of breath when you walk ,which makes you  stop walking, would be very unusual in someone with normal breathing function and no signs of airway obstruction. I would like for you to see your family doctor to evaluate your cardiac function    Return in about 6 weeks (around 06/02/2016).    Thank you for the opportunity to care for this patient.  Please do not hesitate to contact me with questions.  Tonette BihariJ. A. Azrielle Springsteen, M.D.  [facility] [address] [place], [place] [address] [phone]

## 2016-04-21 NOTE — Patient Instructions (Signed)
Claritin 10 mg once a day for runny nose Montelukast  10 mg once a day for coughing or wheezing Ventolin 2 puffs every 4 hours if needed for wheezing or coughing spells Fluticasone 1 spray per nostril twice a day if needed for stuffy nose  Your  shortness of breath when you walk ,which makes you  stop walking, would be very unusual in someone with normal breathing function and no signs of airway obstruction. I would like for you to see your family doctor to evaluate your cardiac function

## 2016-04-22 MED FILL — LINZESS 290 MCG CAPSULE: 290 | 30 days supply | Qty: 30 | Fill #0

## 2016-04-27 MED FILL — QUETIAPINE FUMARATE 400 MG: 400 | 90 days supply | Qty: 180 | Fill #0

## 2016-04-29 MED FILL — DULoxetine HCL 30 MG CPEP: 30 | 30 days supply | Qty: 30 | Fill #2

## 2016-04-29 MED FILL — MONTELUKAST SOD 10 MG TAB: 10 | 30 days supply | Qty: 30 | Fill #5

## 2016-05-06 MED FILL — FUROSEMIDE 40 MG TABLET: 40 | 30 days supply | Qty: 30 | Fill #1

## 2016-05-06 MED FILL — traMADol HCL 50 MG TABS: 50 | 30 days supply | Qty: 90 | Fill #2

## 2016-05-20 MED FILL — diazePAM 5 MG TABS: 5 | 10 days supply | Qty: 10 | Fill #0

## 2016-05-21 MED FILL — clonazePAM 1 MG TABS: 1 | 30 days supply | Qty: 60 | Fill #0

## 2016-05-22 MED FILL — HYDROCODON-APAP 5-325: 5-325 | 2 days supply | Qty: 15 | Fill #0

## 2016-05-26 MED FILL — VENTOLIN HFA 90 MCG INHALER: 108 (90 BAS | 25 days supply | Qty: 18 | Fill #1

## 2016-06-01 MED FILL — MONTELUKAST SOD 10 MG TAB: 10 | 30 days supply | Qty: 30 | Fill #6

## 2016-06-02 MED FILL — DULoxetine HCL 30 MG CPEP: 30 | 30 days supply | Qty: 30 | Fill #0

## 2016-06-15 MED FILL — LINZESS 290 MCG CAPSULE: 290 | 30 days supply | Qty: 30 | Fill #1

## 2016-06-15 MED FILL — FLUoxetine HCL 20 MG CAPS: 20 | 90 days supply | Qty: 270 | Fill #1

## 2016-06-15 MED FILL — FUROSEMIDE 40 MG TABLET: 40 | 90 days supply | Qty: 90 | Fill #2

## 2016-06-23 DIAGNOSIS — F315 Bipolar disorder, current episode depressed, severe, with psychotic features: Secondary | ICD-10-CM | POA: Diagnosis not present

## 2016-06-23 MED FILL — clonazePAM 1 MG TABS: 1 | 30 days supply | Qty: 60 | Fill #0

## 2016-06-26 DIAGNOSIS — M5416 Radiculopathy, lumbar region: Secondary | ICD-10-CM | POA: Diagnosis not present

## 2016-06-26 DIAGNOSIS — G43009 Migraine without aura, not intractable, without status migrainosus: Secondary | ICD-10-CM | POA: Diagnosis not present

## 2016-06-26 DIAGNOSIS — M533 Sacrococcygeal disorders, not elsewhere classified: Secondary | ICD-10-CM | POA: Diagnosis not present

## 2016-06-26 DIAGNOSIS — M542 Cervicalgia: Secondary | ICD-10-CM | POA: Diagnosis not present

## 2016-06-26 DIAGNOSIS — G894 Chronic pain syndrome: Secondary | ICD-10-CM | POA: Diagnosis not present

## 2016-06-26 DIAGNOSIS — M797 Fibromyalgia: Secondary | ICD-10-CM | POA: Diagnosis not present

## 2016-06-26 DIAGNOSIS — M5442 Lumbago with sciatica, left side: Secondary | ICD-10-CM | POA: Diagnosis not present

## 2016-06-26 DIAGNOSIS — M5126 Other intervertebral disc displacement, lumbar region: Secondary | ICD-10-CM | POA: Diagnosis not present

## 2016-06-26 DIAGNOSIS — M5441 Lumbago with sciatica, right side: Secondary | ICD-10-CM | POA: Diagnosis not present

## 2016-06-26 MED FILL — DULoxetine HCL 60 MG CPEP: 60 | 30 days supply | Qty: 30 | Fill #0

## 2016-06-26 MED FILL — GABAPENTIN 400 MG CAPSULE: 400 | 30 days supply | Qty: 180 | Fill #0

## 2016-06-26 MED FILL — tiZANidine HCL 4 MG TABS: 4 | 30 days supply | Qty: 120 | Fill #0

## 2016-06-26 MED FILL — TOPIRAMATE 100 MG TABLET: 100 | 30 days supply | Qty: 60 | Fill #0

## 2016-06-30 ENCOUNTER — Encounter: Payer: Self-pay | Admitting: Family Medicine

## 2016-06-30 ENCOUNTER — Ambulatory Visit (INDEPENDENT_AMBULATORY_CARE_PROVIDER_SITE_OTHER): Payer: 59 | Admitting: Family Medicine

## 2016-06-30 VITALS — BP 132/71 | HR 93 | Temp 98.4°F | Ht 64.0 in | Wt 185.0 lb

## 2016-06-30 DIAGNOSIS — M797 Fibromyalgia: Secondary | ICD-10-CM

## 2016-06-30 DIAGNOSIS — E785 Hyperlipidemia, unspecified: Secondary | ICD-10-CM | POA: Diagnosis not present

## 2016-06-30 DIAGNOSIS — E119 Type 2 diabetes mellitus without complications: Secondary | ICD-10-CM

## 2016-06-30 DIAGNOSIS — R5382 Chronic fatigue, unspecified: Secondary | ICD-10-CM

## 2016-06-30 DIAGNOSIS — Z794 Long term (current) use of insulin: Secondary | ICD-10-CM | POA: Diagnosis not present

## 2016-06-30 DIAGNOSIS — K5900 Constipation, unspecified: Secondary | ICD-10-CM

## 2016-06-30 LAB — COMPLETE METABOLIC PANEL WITH GFR
ALBUMIN: 4.1 g/dL (ref 3.6–5.1)
ALK PHOS: 169 U/L — AB (ref 33–130)
ALT: 21 U/L (ref 6–29)
AST: 27 U/L (ref 10–35)
BILIRUBIN TOTAL: 0.2 mg/dL (ref 0.2–1.2)
BUN: 20 mg/dL (ref 7–25)
CALCIUM: 9.2 mg/dL (ref 8.6–10.4)
CO2: 28 mmol/L (ref 20–31)
CREATININE: 1.39 mg/dL — AB (ref 0.50–0.99)
Chloride: 102 mmol/L (ref 98–110)
GFR, Est African American: 47 mL/min — ABNORMAL LOW (ref >=60)
GFR, Est Non African American: 40 mL/min — ABNORMAL LOW (ref >=60)
GLUCOSE: 189 mg/dL — AB (ref 65–99)
POTASSIUM: 3.4 mmol/L — AB (ref 3.5–5.3)
Sodium: 140 mmol/L (ref 135–146)
Total Protein: 7 g/dL (ref 6.1–8.1)

## 2016-06-30 LAB — CBC WITH DIFFERENTIAL/PLATELET
BASOS PCT: 0 %
Basophils Absolute: 0 cells/uL (ref 0–200)
Eosinophils Absolute: 168 cells/uL (ref 15–500)
Eosinophils Relative: 3 %
HCT: 38.5 % (ref 35.0–45.0)
Hemoglobin: 12.6 g/dL (ref 11.7–15.5)
LYMPHS PCT: 23 %
Lymphs Abs: 1288 cells/uL (ref 850–3900)
MCH: 28.5 pg (ref 27.0–33.0)
MCHC: 32.7 g/dL (ref 32.0–36.0)
MCV: 87.1 fL (ref 80.0–100.0)
MONO ABS: 392 {cells}/uL (ref 200–950)
MONOS PCT: 7 %
MPV: 9.1 fL (ref 7.5–12.5)
Neutro Abs: 3752 cells/uL (ref 1500–7800)
Neutrophils Relative %: 67 %
PLATELETS: 285 10*3/uL (ref 140–400)
RBC: 4.42 MIL/uL (ref 3.80–5.10)
RDW: 16.2 % — AB (ref 11.0–15.0)
WBC: 5.6 10*3/uL (ref 3.8–10.8)

## 2016-06-30 LAB — LIPID PANEL
CHOL/HDL RATIO: 2.4 ratio (ref <=5.0)
Cholesterol: 176 mg/dL (ref 125–200)
HDL: 74 mg/dL (ref >=46)
LDL Cholesterol: 57 mg/dL (ref <130)
Triglycerides: 224 mg/dL — ABNORMAL HIGH (ref <150)
VLDL: 45 mg/dL — AB (ref <30)

## 2016-06-30 LAB — POCT GLYCOSYLATED HEMOGLOBIN (HGB A1C): HEMOGLOBIN A1C: 6.3

## 2016-06-30 LAB — TSH: TSH: 1.08 m[IU]/L

## 2016-06-30 MED ORDER — ATORVASTATIN CALCIUM 40 MG PO TABS: 40.0000 mg | ORAL_TABLET | Freq: Every day | ORAL | 3 refills | Status: DC

## 2016-06-30 MED ORDER — POLYETHYLENE GLYCOL 3350 17 GM/SCOOP PO POWD: 17.0000 g | Freq: Every day | ORAL | 2 refills | Status: DC

## 2016-06-30 MED FILL — POLYETHYLENE GLYCOL 3350: 30 days supply | Qty: 510 | Fill #0

## 2016-06-30 MED FILL — ATORVASTATIN 40 MG TABLET: 40 | 90 days supply | Qty: 90 | Fill #0

## 2016-06-30 NOTE — Assessment & Plan Note (Addendum)
  Chronic problem, patient reports poor sleep and snoring  -will check CBC to rule out anemia -will check TSH to rule out hypothyroidism -Patient unwilling to have sleep study at this time but will consider it in future, will continue to offer at future visits -encouraged exercise at least 3 times a week for 30 minutes each -counseled on sleep hygiene

## 2016-06-30 NOTE — Assessment & Plan Note (Signed)
  Currently taking high dose simvastatin which may be contributing to muscle pains, no current cholesterol levels on file  -check lipid panel today -will switch to 40mg  atorvastatin daily -follow up on results of blood test

## 2016-06-30 NOTE — Assessment & Plan Note (Signed)
  Currently not on medication, no recent A1C on file  -will check A1C today -consider adding medication depending on results

## 2016-06-30 NOTE — Assessment & Plan Note (Signed)
  Reports bowel movements about every 3 days, has taken Linzess in past with no improvement  -prescription given for miralax -encouraged increasing water, vegetables in diet -encouraged exercise daily

## 2016-06-30 NOTE — Patient Instructions (Signed)
  I will call you with lab results  Please take Miralax 1 cap daily, titrate as needed/tolerated  I recommend exercising at least 3 times a week, thirty minutes per session. Walking is great, swimming and biking are also good choices.

## 2016-06-30 NOTE — Progress Notes (Signed)
Subjective:    Patient ID: Kelly Shannon, female    DOB: [birth year], 65 y.o.   MRN: [id number]   Kelly Shannon is here to establish care. She is a 65 year old female with an extensive past medical history and medication list. She is followed by pain management for fibromyalgia chronic pain and also by psychiatry for bipolar disorder. Last saw her previous PCP 6 months ago. Current complaints are discussed below  Fibromyalgia Followed by pain management, saw doctor last week. Pain not well controlled. Taking gabapentin and tizanidine. Stopped exercising due to pain. Ibuprofen and tylenol are not helpful. Pain is all over soreness. She denies joint pain. Is on SSRI and SNRI per psych, sees them every 3 months. Has had therapy in the past but did not think it helped. Does not want to try this again. No current exercise.  DM Has been dx with diabetes in the past, used to take Metformin. Was told her sugars were normal previously and has not taken medicine for diabetes in well over a year. Denies polyuria, polydipsia. Never has had complications from diabetes including neuropathy, nephropathy, retinopathy. Never used insulin.  High cholesterol Takes simvastatin 80 mg daily, has not had cholesterol checked in past year or more  Fatigue Reports very poor sleep, reports getting only two hours of sleep a night. Will get in bed at 9:30 and fall asleep within 15 minutes but wakes up every hour or more frequently. Does snore, has never had sleep study and is hesitant to have one now. Does not read or watch TV in bed. Only drinks coffee two cups in the morning. No other caffeine beverages. Reports bad nightmares that wake her up a lot. Does not take naps during the day. Feels tired all the time. She takes 2mg  of Klonopin at night before bed without help. Has been taking this for 15 years.  Constipation Has been a chronic problem for her, reports bowel movements maybe every 3 days. Has tried linzess  without relief. Drinks a lot of water. Tries to eat a balanced diet. Does not use other medications or fiber supplements.    PMH- high cholesterol, hx of diabetes, bipolar, fibromyalgia, asthma meds- see med rec Allergies- lyrica, sulfa drugs, penicillins, peanut oil Surgical hx- hysterectomy, bladder sling FH- both parents dec from stroke, father unknown hx, mother had heart disease SH- retired lives at home with husband, one child in [place], denies smoking alcohol drug use    Objective:  BP 132/71   Pulse 93   Temp 98.4 F (36.9 C) (Oral)   Ht 5\' 4"  (1.626 m)   Wt 185 lb (83.9 kg)   BMI 31.76 kg/m  Vitals and nursing note reviewed  General: well nourished, in NAD HEENT: normocephalic, atraumatic, moist mucous membranes with good dentition, no erythema or exudate in posterior oropharynx Neck: supple, non-tender, without lymphadenopathy Cardiac: RRR, clear S1 and S2, no murmurs, rubs, or gallops Respiratory: clear to auscultation bilaterally, no increased work of breathing Abdomen: obese abdomen, soft, nontender, nondistended, no masses or organomegaly. Bowel sounds present in all 4 quadrants Extremities: no edema or cyanosis. Warm, well perfused. Normal range of motion. Skin: warm and dry, no rashes noted Neuro: alert and oriented, no focal deficits   Assessment & Plan:    Type 2 diabetes mellitus (HCC)  Currently not on medication, no recent A1C on file  -will check A1C today -consider adding medication depending on results   HLD (hyperlipidemia)  Currently taking high dose  simvastatin which may be contributing to muscle pains, no current cholesterol levels on file  -check lipid panel today -will switch to 40mg  atorvastatin daily -follow up on results of blood test  Fatigue  Chronic problem, patient reports poor sleep and snoring  -will check CBC to rule out anemia -will check TSH to rule out hypothyroidism -Patient unwilling to have sleep study at this  time but will consider it in future, will continue to offer at future visits -encouraged exercise at least 3 times a week for 30 minutes each -counseled on sleep hygiene   Fibromyalgia  Followed by psych and pain management but symptoms are not well controlled  -patient advised to speak with pain management doctor to discuss pain control -encouraged exercise  -patient already taking ssri and snri per psych -changed statin today, hopefully will give some benefit -will follow at future visits  CN (constipation)  Reports bowel movements about every 3 days, has taken Linzess in past with no improvement  -prescription given for miralax -encouraged increasing water, vegetables in diet -encouraged exercise daily    Dolores PattyAngela Angle Dirusso, DO Family Medicine Resident PGY-1

## 2016-06-30 NOTE — Assessment & Plan Note (Signed)
  Followed by psych and pain management but symptoms are not well controlled  -patient advised to speak with pain management doctor to discuss pain control -encouraged exercise  -patient already taking ssri and snri per psych -changed statin today, hopefully will give some benefit -will follow at future visits

## 2016-07-01 ENCOUNTER — Telehealth: Payer: Self-pay | Admitting: Family Medicine

## 2016-07-01 ENCOUNTER — Telehealth: Payer: Self-pay | Admitting: *Deleted

## 2016-07-01 NOTE — Telephone Encounter (Signed)
-----   Message from Tillman SersAngela C Riccio, DO sent at 07/01/2016  2:34 PM EDT -----  Labs normal.  Please ask patient to follow up in 3-4 weeks.

## 2016-07-01 NOTE — Telephone Encounter (Signed)
Spoke with patient and results were given and appointment made for 07-22-16. Jazmin Hartsell,CMA

## 2016-07-01 NOTE — Telephone Encounter (Signed)
At yesterdays visit, pt thought she was receiving a new prescription for choloestrol medicine. When she went pick it up  at the pharmacy, there was none there. Please advise

## 2016-07-01 NOTE — Telephone Encounter (Signed)
Spoke with patient and she states that she did receive a call from the pharmacy regarding someone's medication was ready for pick up but she wasn't sure if it was hers or her husband's.  I asked her to call and check on this and if it wasn't her medication to let me know so I can call and check on it. Jerolene Kupfer,CMA

## 2016-07-02 ENCOUNTER — Telehealth: Payer: Self-pay | Admitting: *Deleted

## 2016-07-02 ENCOUNTER — Telehealth: Payer: Self-pay | Admitting: Family Medicine

## 2016-07-02 NOTE — Telephone Encounter (Signed)
Lm for pt to return my call to discuss.

## 2016-07-02 NOTE — Telephone Encounter (Signed)
Pt informed of dr's message about new cholesterol medicine. Deseree Bruna PotterBlount, CMA

## 2016-07-02 NOTE — Telephone Encounter (Signed)
Wants to know when to take her new cholesterol medicine.

## 2016-07-07 MED FILL — MONTELUKAST SOD 10 MG TAB: 10 | 30 days supply | Qty: 30 | Fill #7

## 2016-07-20 ENCOUNTER — Telehealth: Payer: Self-pay | Admitting: Family Medicine

## 2016-07-20 NOTE — Telephone Encounter (Signed)
Pt husbands says pt wants to be referred to pain mgt clinic [facility]. Please advise

## 2016-07-20 NOTE — Telephone Encounter (Signed)
Will forward to MD. Keanna Tugwell,CMA  

## 2016-07-22 ENCOUNTER — Ambulatory Visit (INDEPENDENT_AMBULATORY_CARE_PROVIDER_SITE_OTHER): Payer: 59 | Admitting: Family Medicine

## 2016-07-22 ENCOUNTER — Encounter: Payer: Self-pay | Admitting: Family Medicine

## 2016-07-22 VITALS — BP 147/78 | HR 85 | Temp 98.1°F | Wt 184.0 lb

## 2016-07-22 DIAGNOSIS — G8929 Other chronic pain: Secondary | ICD-10-CM

## 2016-07-22 NOTE — Progress Notes (Signed)
    Subjective:    Patient ID: Kelly Shannon, female    DOB: 07/25/1951, 65 y.o.   MRN: [id number]   CC: wants referral to new pain doctor  HPI: Has been managed by [facility] in past but does not feel her pain is well controlled. Feels like her fibromyalgia is the biggest cause of all over pain and fatigue. Also sees psychiatry regularly and follows up with them in one month. Has tried to incorporate more exercise into her daily routine but the gym she goes to does not have a pool or place to walk. She cannot use elliptical or bike due to joint pains. Tried to refund membership but they would not allow her to do so without a doctor's note.   Constipation has improved with use of Miralax. Did pick up new prescription for statin and has been taking this but it has not improved her pain at all.   Otherwise is doing well without complaints. Denies weight loss, chest pain, shortness of breath, calf pain, decrease in appetite.  Smoking status reviewed- non smoker  Review of Systems- see HPI   Objective:  BP (!) 147/78   Pulse 85   Temp 98.1 F (36.7 C) (Oral)   Wt 184 lb (83.5 kg)   SpO2 93%   BMI 31.58 kg/m  Vitals and nursing note reviewed  General: well nourished, in no acute distress Cardiac: RRR, clear S1 and S2, no murmurs, rubs, or gallops Respiratory: clear to auscultation bilaterally, no increased work of breathing Abdomen: soft, nontender, nondistended, no masses or organomegaly. Bowel sounds present Extremities: no edema or cyanosis. Warm, well perfused. +2 dorsalis pedis pulses bilaterally  Skin: warm and dry, no rashes noted Neuro: alert and oriented, no focal deficits   Assessment & Plan:    Chronic pain  Patient requesting new pain management doctor. Also unhappy with gym membership that she cannot revoke without doctor's note  -note given to patient explaining chronic pain dx -[facility] -encouraged exercise to help with fibromyalgia -tolerating new statin well but change has not improved muscle pains at all -follow up in 6 months or sooner if needed    Return in about 6 months (around 01/19/2017).   Dolores PattyAngela Sevin Langenbach, DO Family Medicine Resident PGY-1

## 2016-07-22 NOTE — Patient Instructions (Addendum)
   You should hear from referral department in the next few weeks.  Follow up in 6 months or sooner if needed.

## 2016-07-22 NOTE — Assessment & Plan Note (Signed)
  Patient requesting new pain management doctor. Also unhappy with gym membership that she cannot revoke without doctor's note  -note given to patient explaining chronic pain dx -[facility] made [facility] -encouraged exercise to help with fibromyalgia -tolerating new statin well but change has not improved muscle pains at all -follow up in 6 months or sooner if needed

## 2016-07-24 MED FILL — DULoxetine HCL 60 MG CPEP: 60 | 30 days supply | Qty: 30 | Fill #1

## 2016-07-24 MED FILL — QUETIAPINE FUMARATE 400 MG: 400 | 90 days supply | Qty: 180 | Fill #1

## 2016-07-24 MED FILL — clonazePAM 1 MG TABS: 1 | 30 days supply | Qty: 60 | Fill #1

## 2016-07-29 MED FILL — traMADol HCL 50 MG TABS: 50 | 30 days supply | Qty: 90 | Fill #0

## 2016-08-03 MED FILL — GABAPENTIN 400 MG CAPSULE: 400 | 30 days supply | Qty: 180 | Fill #1

## 2016-08-03 MED FILL — tiZANidine HCL 4 MG TABS: 4 | 30 days supply | Qty: 120 | Fill #1

## 2016-08-06 ENCOUNTER — Telehealth: Payer: Self-pay | Admitting: Family Medicine

## 2016-08-06 NOTE — Telephone Encounter (Signed)
Informed patient that per most recent referral note it can take up to 6 weeks before a decision is made on whether or not patient will be accepted into their practice.  She did voice understanding on this. Jazmin Hartsell,CMA

## 2016-08-06 NOTE — Telephone Encounter (Signed)
Patient asks doctor about pain management referral because she hasn't heard anything since her last appointment on September 20.  Please, follow up.

## 2016-08-10 MED FILL — TOPIRAMATE 100 MG TABLET: 100 | 30 days supply | Qty: 60 | Fill #1

## 2016-08-10 MED FILL — MONTELUKAST SOD 10 MG TAB: 10 | 30 days supply | Qty: 30 | Fill #8

## 2016-08-10 MED FILL — LINZESS 290 MCG CAPSULE: 290 | 30 days supply | Qty: 30 | Fill #2

## 2016-08-21 MED FILL — clonazePAM 1 MG TABS: 1 | 30 days supply | Qty: 60 | Fill #2

## 2016-08-26 MED FILL — DULoxetine HCL 60 MG CPEP: 60 | 30 days supply | Qty: 30 | Fill #2

## 2016-08-31 MED FILL — PROMETHAZINE 25 MG TABLET: 25 | 15 days supply | Qty: 30 | Fill #0

## 2016-09-01 MED FILL — POLYETHYLENE GLYCOL 3350: 30 days supply | Qty: 510 | Fill #1

## 2016-09-07 MED FILL — MONTELUKAST SOD 10 MG TAB: 10 | 30 days supply | Qty: 30 | Fill #9

## 2016-09-07 MED FILL — GABAPENTIN 400 MG CAPSULE: 400 | 30 days supply | Qty: 180 | Fill #2

## 2016-09-14 MED FILL — TOPIRAMATE 100 MG TABLET: 100 | 30 days supply | Qty: 60 | Fill #2

## 2016-09-14 MED FILL — FLUTICASONE PROP 50 MCG SPR: 50 | 30 days supply | Qty: 16 | Fill #1

## 2016-09-14 NOTE — Telephone Encounter (Signed)
[facility] still has not received MRs for old pain MD. Called there office, was told that the request was scanned in but she was unsure if they were actually sent. She will sent message to MR coordinator. Will await call.

## 2016-09-18 MED FILL — tiZANidine HCL 4 MG TABS: 4 | 30 days supply | Qty: 120 | Fill #2

## 2016-09-22 MED FILL — ATORVASTATIN 40 MG TABLET: 40 | 90 days supply | Qty: 90 | Fill #1

## 2016-09-22 MED FILL — DULoxetine HCL 60 MG CPEP: 60 | 30 days supply | Qty: 30 | Fill #3

## 2016-09-22 MED FILL — clonazePAM 1 MG TABS: 1 | 30 days supply | Qty: 60 | Fill #0

## 2016-09-30 DIAGNOSIS — F315 Bipolar disorder, current episode depressed, severe, with psychotic features: Secondary | ICD-10-CM | POA: Diagnosis not present

## 2016-10-08 MED FILL — MONTELUKAST SOD 10 MG TAB: 10 | 30 days supply | Qty: 30 | Fill #10

## 2016-10-13 MED FILL — LINZESS 290 MCG CAPSULE: 290 | 30 days supply | Qty: 30 | Fill #3

## 2016-10-15 MED FILL — QUETIAPINE FUMARATE 400 MG: 400 | 90 days supply | Qty: 180 | Fill #0

## 2016-10-21 MED FILL — GABAPENTIN 400 MG CAPSULE: 400 | 30 days supply | Qty: 180 | Fill #3

## 2016-10-21 MED FILL — clonazePAM 1 MG TABS: 1 | 30 days supply | Qty: 60 | Fill #0

## 2016-10-21 MED FILL — TOPIRAMATE 100 MG TABLET: 100 | 30 days supply | Qty: 60 | Fill #3

## 2016-10-22 MED FILL — DULoxetine HCL 60 MG CPEP: 60 | 30 days supply | Qty: 30 | Fill #4

## 2016-10-23 MED FILL — traMADol HCL 50 MG TABS: 50 | 30 days supply | Qty: 90 | Fill #1

## 2016-11-04 DIAGNOSIS — G8929 Other chronic pain: Secondary | ICD-10-CM | POA: Diagnosis not present

## 2016-11-04 DIAGNOSIS — M5416 Radiculopathy, lumbar region: Secondary | ICD-10-CM | POA: Diagnosis not present

## 2016-11-04 DIAGNOSIS — G894 Chronic pain syndrome: Secondary | ICD-10-CM | POA: Diagnosis not present

## 2016-11-04 DIAGNOSIS — M25552 Pain in left hip: Secondary | ICD-10-CM | POA: Diagnosis not present

## 2016-11-04 DIAGNOSIS — M5442 Lumbago with sciatica, left side: Secondary | ICD-10-CM | POA: Diagnosis not present

## 2016-11-04 DIAGNOSIS — G43009 Migraine without aura, not intractable, without status migrainosus: Secondary | ICD-10-CM | POA: Diagnosis not present

## 2016-11-04 DIAGNOSIS — M5441 Lumbago with sciatica, right side: Secondary | ICD-10-CM | POA: Diagnosis not present

## 2016-11-04 DIAGNOSIS — M797 Fibromyalgia: Secondary | ICD-10-CM | POA: Diagnosis not present

## 2016-11-04 DIAGNOSIS — M5126 Other intervertebral disc displacement, lumbar region: Secondary | ICD-10-CM | POA: Diagnosis not present

## 2016-11-04 MED FILL — DULoxetine HCL 60 MG CPEP: 60 | 90 days supply | Qty: 180 | Fill #0

## 2016-11-04 MED FILL — tiZANidine HCL 4 MG TABS: 4 | 90 days supply | Qty: 360 | Fill #0

## 2016-11-09 MED FILL — MONTELUKAST SOD 10 MG TAB: 10 | 30 days supply | Qty: 30 | Fill #11

## 2016-11-18 MED FILL — clonazePAM 1 MG TABS: 1 | 30 days supply | Qty: 60 | Fill #1

## 2016-11-30 MED FILL — PROMETHAZINE 25 MG TABLET: 25 | 15 days supply | Qty: 30 | Fill #1

## 2016-11-30 MED FILL — GABAPENTIN 400 MG CAPSULE: 400 | 30 days supply | Qty: 180 | Fill #4

## 2016-11-30 MED FILL — TOPIRAMATE 100 MG TABLET: 100 | 30 days supply | Qty: 60 | Fill #4

## 2016-12-01 MED FILL — FLUTICASONE PROP 50 MCG SPR: 50 | 30 days supply | Qty: 16 | Fill #2

## 2016-12-08 ENCOUNTER — Other Ambulatory Visit: Payer: Self-pay | Admitting: Family Medicine

## 2016-12-08 MED ORDER — MONTELUKAST SODIUM 10 MG PO TABS: 10.0000 mg | ORAL_TABLET | Freq: Every day | ORAL | 5 refills | Status: DC

## 2016-12-08 MED FILL — MONTELUKAST SOD 10 MG TAB: 10 | 30 days supply | Qty: 30 | Fill #0

## 2016-12-08 MED FILL — traMADol HCL 50 MG TABS: 50 | 30 days supply | Qty: 90 | Fill #2

## 2016-12-08 NOTE — Telephone Encounter (Signed)
Pt is needs a refill on montelukast. [facility] ep

## 2016-12-23 MED FILL — ATORVASTATIN 40 MG TABLET: 40 | 90 days supply | Qty: 90 | Fill #2

## 2016-12-23 MED FILL — clonazePAM 1 MG TABS: 1 | 30 days supply | Qty: 60 | Fill #2

## 2016-12-31 MED FILL — QUETIAPINE FUMARATE 400 MG: 400 | 90 days supply | Qty: 180 | Fill #1

## 2017-01-07 MED FILL — TOPIRAMATE 25 MG TABLET: 25 | 90 days supply | Qty: 360 | Fill #0

## 2017-01-13 MED FILL — MONTELUKAST SOD 10 MG TAB: 10 | 30 days supply | Qty: 30 | Fill #1

## 2017-01-20 MED FILL — GABAPENTIN 400 MG CAPSULE: 400 | 30 days supply | Qty: 180 | Fill #5

## 2017-01-21 MED FILL — clonazePAM 1 MG TABS: 1 | 30 days supply | Qty: 60 | Fill #1

## 2017-01-25 MED FILL — traMADol HCL 50 MG TABS: 50 | 30 days supply | Qty: 90 | Fill #0

## 2017-02-01 MED FILL — tiZANidine HCL 4 MG TABS: 4 | 90 days supply | Qty: 360 | Fill #1

## 2017-02-11 MED FILL — MONTELUKAST SOD 10 MG TAB: 10 | 30 days supply | Qty: 30 | Fill #2

## 2017-02-18 MED FILL — clonazePAM 1 MG TABS: 1 | 30 days supply | Qty: 60 | Fill #2

## 2017-02-25 DIAGNOSIS — F411 Generalized anxiety disorder: Secondary | ICD-10-CM | POA: Diagnosis not present

## 2017-02-25 DIAGNOSIS — F315 Bipolar disorder, current episode depressed, severe, with psychotic features: Secondary | ICD-10-CM | POA: Diagnosis not present

## 2017-02-25 MED FILL — QUETIAPINE 300 MG TABLET: 300 | 90 days supply | Qty: 90 | Fill #0

## 2017-02-25 MED FILL — DULoxetine HCL 60 MG CPEP: 60 | 90 days supply | Qty: 180 | Fill #0

## 2017-02-25 MED FILL — GABAPENTIN 100 MG CAPSULE: 100 | 30 days supply | Qty: 60 | Fill #0

## 2017-03-02 MED FILL — PROMETHAZINE 25 MG TABLET: 25 | 15 days supply | Qty: 30 | Fill #0

## 2017-03-18 MED FILL — traMADol HCL 50 MG TABS: 50 | 30 days supply | Qty: 90 | Fill #1

## 2017-03-18 MED FILL — MONTELUKAST SOD 10 MG TAB: 10 | 30 days supply | Qty: 30 | Fill #3

## 2017-03-19 MED FILL — clonazePAM 1 MG TABS: 1 | 30 days supply | Qty: 60 | Fill #0

## 2017-03-19 MED FILL — LINZESS 290 MCG CAPSULE: 290 | 30 days supply | Qty: 30 | Fill #4

## 2017-03-19 MED FILL — ATORVASTATIN 40 MG TABLET: 40 | 90 days supply | Qty: 90 | Fill #3

## 2017-04-05 MED FILL — FUROSEMIDE 40 MG TABLET: 40 | 30 days supply | Qty: 30 | Fill #0

## 2017-04-05 MED FILL — GABAPENTIN 100 MG CAPSULE: 100 | 30 days supply | Qty: 60 | Fill #1

## 2017-04-05 MED FILL — POLYETHYLENE GLYCOL 3350 PO: 90 days supply | Qty: 1581 | Fill #0

## 2017-04-14 DIAGNOSIS — F315 Bipolar disorder, current episode depressed, severe, with psychotic features: Secondary | ICD-10-CM | POA: Diagnosis not present

## 2017-04-14 DIAGNOSIS — F411 Generalized anxiety disorder: Secondary | ICD-10-CM | POA: Diagnosis not present

## 2017-04-16 MED FILL — traMADol HCL 50 MG TABS: 50 | 30 days supply | Qty: 90 | Fill #2

## 2017-04-16 MED FILL — clonazePAM 1 MG TABS: 1 | 30 days supply | Qty: 60 | Fill #1

## 2017-04-22 MED FILL — MONTELUKAST SOD 10 MG TAB: 10 | 30 days supply | Qty: 30 | Fill #4

## 2017-05-04 DIAGNOSIS — F411 Generalized anxiety disorder: Secondary | ICD-10-CM | POA: Diagnosis not present

## 2017-05-04 DIAGNOSIS — F315 Bipolar disorder, current episode depressed, severe, with psychotic features: Secondary | ICD-10-CM | POA: Diagnosis not present

## 2017-05-04 MED FILL — FLUoxetine HCL 10 MG CAPS: 10 | 90 days supply | Qty: 180 | Fill #0

## 2017-05-04 MED FILL — DULoxetine HCL 30 MG CPEP: 30 | 7 days supply | Qty: 7 | Fill #0

## 2017-05-11 MED FILL — tiZANidine HCL 4 MG TABS: 4 | 30 days supply | Qty: 120 | Fill #3

## 2017-05-11 MED FILL — GABAPENTIN 400 MG CAPSULE: 400 | 90 days supply | Qty: 540 | Fill #0

## 2017-05-11 MED FILL — TOPIRAMATE 25 MG TABLET: 25 | 90 days supply | Qty: 360 | Fill #1

## 2017-05-14 MED FILL — clonazePAM 1 MG TABS: 1 | 30 days supply | Qty: 60 | Fill #2

## 2017-05-17 MED FILL — QUETIAPINE FUMARATE 300 MG: 300 | 90 days supply | Qty: 90 | Fill #0

## 2017-05-24 DIAGNOSIS — G8929 Other chronic pain: Secondary | ICD-10-CM | POA: Diagnosis not present

## 2017-05-24 DIAGNOSIS — G43009 Migraine without aura, not intractable, without status migrainosus: Secondary | ICD-10-CM | POA: Diagnosis not present

## 2017-05-24 DIAGNOSIS — M533 Sacrococcygeal disorders, not elsewhere classified: Secondary | ICD-10-CM | POA: Diagnosis not present

## 2017-05-24 DIAGNOSIS — M5441 Lumbago with sciatica, right side: Secondary | ICD-10-CM | POA: Diagnosis not present

## 2017-05-24 DIAGNOSIS — M797 Fibromyalgia: Secondary | ICD-10-CM | POA: Diagnosis not present

## 2017-05-24 DIAGNOSIS — H524 Presbyopia: Secondary | ICD-10-CM | POA: Diagnosis not present

## 2017-05-24 DIAGNOSIS — M5442 Lumbago with sciatica, left side: Secondary | ICD-10-CM | POA: Diagnosis not present

## 2017-05-24 MED FILL — TOPIRAMATE 100 MG TABLET: 100 | 90 days supply | Qty: 180 | Fill #0

## 2017-05-31 MED FILL — MONTELUKAST SOD 10 MG TAB: 10 | 30 days supply | Qty: 30 | Fill #5

## 2017-06-08 MED FILL — tiZANidine HCL 4 MG TABS: 4 | 30 days supply | Qty: 120 | Fill #0

## 2017-06-11 MED FILL — clonazePAM 1 MG TABS: 1 | 30 days supply | Qty: 60 | Fill #0

## 2017-06-16 DIAGNOSIS — F411 Generalized anxiety disorder: Secondary | ICD-10-CM | POA: Diagnosis not present

## 2017-06-16 DIAGNOSIS — F315 Bipolar disorder, current episode depressed, severe, with psychotic features: Secondary | ICD-10-CM | POA: Diagnosis not present

## 2017-06-25 ENCOUNTER — Other Ambulatory Visit: Payer: Self-pay | Admitting: *Deleted

## 2017-06-25 MED ORDER — ATORVASTATIN CALCIUM 40 MG PO TABS: 40.0000 mg | ORAL_TABLET | Freq: Every day | ORAL | 3 refills | Status: DC

## 2017-06-28 MED FILL — ATORVASTATIN 40 MG TABLET: 40 | 90 days supply | Qty: 90 | Fill #0

## 2017-06-30 ENCOUNTER — Other Ambulatory Visit: Payer: Self-pay | Admitting: Family Medicine

## 2017-07-01 MED FILL — MONTELUKAST SOD 10 MG TAB: 10 | 30 days supply | Qty: 30 | Fill #0

## 2017-07-12 MED FILL — clonazePAM 1 MG TABS: 1 | 30 days supply | Qty: 60 | Fill #1

## 2017-07-12 MED FILL — DULoxetine HCL 60 MG CPEP: 60 | 90 days supply | Qty: 180 | Fill #1

## 2017-07-12 MED FILL — FUROSEMIDE 40 MG TABLET: 40 | 90 days supply | Qty: 90 | Fill #1

## 2017-07-19 MED FILL — traMADol HCL 50 MG TABS: 50 | 15 days supply | Qty: 30 | Fill #0

## 2017-07-19 MED FILL — tiZANidine HCL 4 MG TABS: 4 | 30 days supply | Qty: 120 | Fill #1

## 2017-07-25 DIAGNOSIS — Z23 Encounter for immunization: Secondary | ICD-10-CM | POA: Diagnosis not present

## 2017-07-25 DIAGNOSIS — H10023 Other mucopurulent conjunctivitis, bilateral: Secondary | ICD-10-CM | POA: Diagnosis not present

## 2017-08-05 MED FILL — MONTELUKAST SOD 10 MG TAB: 10 | 30 days supply | Qty: 30 | Fill #1

## 2017-08-05 MED FILL — traMADol HCL 50 MG TABS: 50 | 15 days supply | Qty: 15 | Fill #0

## 2017-08-10 MED FILL — clonazePAM 1 MG TABS: 1 | 30 days supply | Qty: 60 | Fill #2

## 2017-08-13 MED FILL — tiZANidine HCL 4 MG TABS: 4 | 30 days supply | Qty: 120 | Fill #0

## 2017-08-16 MED FILL — TOPIRAMATE 100 MG TABLET: 100 | 90 days supply | Qty: 180 | Fill #1

## 2017-08-16 MED FILL — QUETIAPINE FUMARATE 300 MG: 300 | 30 days supply | Qty: 30 | Fill #0

## 2017-08-23 MED FILL — FLUoxetine HCL 10 MG CAPS: 10 | 90 days supply | Qty: 180 | Fill #1

## 2017-08-30 MED FILL — GABAPENTIN 400 MG CAPSULE: 400 | 90 days supply | Qty: 540 | Fill #1

## 2017-09-03 MED FILL — MONTELUKAST SOD 10 MG TAB: 10 | 30 days supply | Qty: 30 | Fill #2

## 2017-09-09 MED FILL — tiZANidine HCL 4 MG TABS: 4 | 30 days supply | Qty: 120 | Fill #1

## 2017-09-14 DIAGNOSIS — F419 Anxiety disorder, unspecified: Secondary | ICD-10-CM | POA: Diagnosis not present

## 2017-09-14 DIAGNOSIS — F319 Bipolar disorder, unspecified: Secondary | ICD-10-CM | POA: Diagnosis not present

## 2017-09-14 MED FILL — QUETIAPINE FUMARATE 300 MG: 300 | 90 days supply | Qty: 90 | Fill #0

## 2017-09-14 MED FILL — clonazePAM 1 MG TABS: 1 | 30 days supply | Qty: 60 | Fill #0

## 2017-09-14 MED FILL — FLUoxetine HCL 20 MG CAPS: 20 | 90 days supply | Qty: 90 | Fill #0

## 2017-09-27 MED FILL — CLINDAMYCIN HCL 150 MG CAPS: 150 | 1 days supply | Qty: 4 | Fill #0

## 2017-09-27 MED FILL — ATORVASTATIN 40 MG TABLET: 40 | 90 days supply | Qty: 90 | Fill #1

## 2017-10-04 MED FILL — MONTELUKAST SOD 10 MG TAB: 10 | 30 days supply | Qty: 30 | Fill #3

## 2017-10-12 MED FILL — clonazePAM 1 MG TABS: 1 | 30 days supply | Qty: 60 | Fill #1

## 2017-10-14 ENCOUNTER — Telehealth: Payer: Self-pay | Admitting: Family Medicine

## 2017-10-14 NOTE — Telephone Encounter (Signed)
Pt called and said she has a really bad cough and now her throat is sore from coughing so much. She is also really hoarse from it. She wants to know if there is something she can take or if there is something that can be called in for her. Please advise

## 2017-10-14 NOTE — Telephone Encounter (Signed)
Spoke with patient and advised her to try warm tea with honey and lemon to soothe her cough and her throat.  Also informed her that she can try OTC cough medication like delsym or robitussin for relief.  Patient voiced understanding and will call us tomorrow if she feels like she needs to be seen before the weekend. Aleni Andrus,CMA

## 2017-10-18 ENCOUNTER — Ambulatory Visit (INDEPENDENT_AMBULATORY_CARE_PROVIDER_SITE_OTHER): Payer: 59 | Admitting: Student

## 2017-10-18 ENCOUNTER — Other Ambulatory Visit: Payer: Self-pay

## 2017-10-18 ENCOUNTER — Encounter: Payer: Self-pay | Admitting: Student

## 2017-10-18 VITALS — BP 128/72 | HR 99 | Temp 98.1°F | Ht 64.0 in | Wt 162.4 lb

## 2017-10-18 DIAGNOSIS — J309 Allergic rhinitis, unspecified: Secondary | ICD-10-CM | POA: Diagnosis not present

## 2017-10-18 DIAGNOSIS — J209 Acute bronchitis, unspecified: Secondary | ICD-10-CM | POA: Diagnosis not present

## 2017-10-18 MED ORDER — AZITHROMYCIN 250 MG PO TABS: ORAL_TABLET | ORAL | 0 refills | Status: DC

## 2017-10-18 MED ORDER — FLUTICASONE PROPIONATE 50 MCG/ACT NA SUSP: NASAL | 5 refills | Status: DC

## 2017-10-18 MED ORDER — AZITHROMYCIN 250 MG PO TABS: ORAL_TABLET | ORAL | 0 refills | Status: AC

## 2017-10-18 MED FILL — FLUTICASONE PROP 50 MCG SPR: 50 | 30 days supply | Qty: 16 | Fill #0

## 2017-10-18 MED FILL — AZITHROMYCIN 250 MG TABLET: 250 | 5 days supply | Qty: 6 | Fill #0

## 2017-10-18 NOTE — Patient Instructions (Addendum)
It was great seeing you today! We have addressed the following issues today  Cough and congestion: this is likely acute bronchitis which is usually due to viral infection. Given the length of your symptoms it is reasonable to try antibiotic for possible bacterial infection.  I also recommend continuing your Mucinex and albuterol.  Use your Flonase as we discussed in the office.  Try a tablespoonful of honey before bedtime.  Besides, rest and adequate hydration are very important.  Please come and see Kelly Shannon if you have worsening of your symptoms or other symptoms concerning to you.  Annual physical: please schedule a follow-up visit with your primary care doctor for your annual physical.   If we did any lab work today, and the results require attention, either me or my nurse will get in touch with you. If everything is normal, you will get a letter in mail and a message via . If you don't hear from Kelly Shannon in two weeks, please give Kelly Shannon a call. Otherwise, we look forward to seeing you again at your next visit. If you have any questions or concerns before then, please call the clinic at [phone].  Please bring all your medications to every doctors visit  Sign up for My Chart to have easy access to your labs results, and communication with your Primary care physician.    Please check-out at the front desk before leaving the clinic.    Take Care,   Dr. Alanda SlimGonfa   Acute Bronchitis, Adult Acute bronchitis is when air tubes (bronchi) in the lungs suddenly get swollen. The condition can make it hard to breathe. It can also cause these symptoms:  A cough.  Coughing up clear, yellow, or green mucus.  Wheezing.  Chest congestion.  Shortness of breath.  A fever.  Body aches.  Chills.  A sore throat.  Follow these instructions at home: Medicines  Take over-the-counter and prescription medicines only as told by your doctor.  If you were prescribed an antibiotic medicine, take it as told by  your doctor. Do not stop taking the antibiotic even if you start to feel better. General instructions  Rest.  Drink enough fluids to keep your pee (urine) clear or pale yellow.  Avoid smoking and secondhand smoke. If you smoke and you need help quitting, ask your doctor. Quitting will help your lungs heal faster.  Use an inhaler, cool mist vaporizer, or humidifier as told by your doctor.  Keep all follow-up visits as told by your doctor. This is important. How is this prevented? To lower your risk of getting this condition again:  Wash your hands often with soap and water. If you cannot use soap and water, use hand sanitizer.  Avoid contact with people who have cold symptoms.  Try not to touch your hands to your mouth, nose, or eyes.  Make sure to get the flu shot every year.  Contact a doctor if:  Your symptoms do not get better in 2 weeks. Get help right away if:  You cough up blood.  You have chest pain.  You have very bad shortness of breath.  You become dehydrated.  You faint (pass out) or keep feeling like you are going to pass out.  You keep throwing up (vomiting).  You have a very bad headache.  Your fever or chills gets worse. This information is not intended to replace advice given to you by your health care provider. Make sure you discuss any questions you have with your  health care provider. Document Released: 04/06/2008 Document Revised: 05/27/2016 Document Reviewed: 04/08/2016 Elsevier Interactive Patient Education  2017 [employer]

## 2017-10-18 NOTE — Progress Notes (Signed)
Subjective:    Kelly Shannon is a 66 y.o. old female here for cough and congestion.  HPI Cough and congestion: for two weeks. Started with runny nose. Cough is productive with greenish phlegm.  Denies hemoptysis.  Reports intermittent epistaxis. Denies fever. Reports difficulty breathing when she lies down on her back.  Denies swelling in her legs. Reports wheezing. Reports chest pain with cough. Denies nausea or vomiting.  Reports itching in her nose and postnasal dripping.  She denies heartburn or acid reflux.  She had her flu shot this year. Tried Muccinex, tea with lemon and Theraflu without improvement Smoked for two years. Quit in early 80's.' Has history of asthma. She uses singuliar and albuterol PMH/Problem List: has Chronic neck pain; Chronic elbow pain; Bilateral chronic knee pain; Chronic hand pain; Chronic ankle pain; Acute back pain with sciatica; Acute antritis; Allergic rhinitis; Absolute anemia; Anxiety state; Bipolar affective disorder (HCC); Bipolar affective disorder, depressed, severe, with psychotic behavior (HCC); Other intervertebral disc displacement, lumbar region; Chronic ethmoidal sinusitis; Antritis chronic; Chronic pain associated with significant psychosocial dysfunction; Chronic pain; Type 2 diabetes mellitus (HCC); Atypical migraine; CN (constipation); Diabetes mellitus (HCC); Accumulation of fluid in tissues; Encounter for screening for malignant neoplasm of colon; Fatigue; Fibromyalgia; Cardiac murmur; Hot flash, menopausal; Hypercholesterolemia; HLD (hyperlipidemia); Cannot sleep; LBP (low back pain); Lumbar radiculopathy; Menopausal and perimenopausal disorder; Muscle ache; Cervical pain; Anankastic personality disorder (HCC); Arthralgia of hip; Polypharmacy; RAD (reactive airway disease); Impaired renal function; Arthralgia, sacroiliac; Disturbance in sleep behavior; Adynamia; Asthma with acute exacerbation; and Mild persistent asthma on their problem list.   has a  past medical history of Asthma, Diabetes mellitus, Hyperlipidemia, and Hypertension.  FH:  Family History  Problem Relation Age of Onset  . Diabetes Mother   . Heart disease Mother   . Heart disease Father     SH Social History   Tobacco Use  . Smoking status: Never Smoker  . Smokeless tobacco: Never Used  Substance Use Topics  . Alcohol use: No  . Drug use: No    Review of Systems Review of systems negative except for pertinent positives and negatives in history of present illness above.     Objective:     Vitals:   10/18/17 1125  BP: 128/72  Pulse: 99  Temp: 98.1 F (36.7 C)  TempSrc: Oral  SpO2: 97%  Weight: 162 lb 6.4 oz (73.7 kg)  Height: 5\' 4"  (1.626 m)   Body mass index is 27.88 kg/m.  Physical Exam  GEN: appears well, no apparent distress. Head: normocephalic and atraumatic  Eyes: conjunctiva without injection, sclera anicteric Ears: external ear, ear canal and TM normal Nares: no rhinorrhea or congestion.  Some erythema of right inferior turbinates Oropharynx: mmm without erythema or exudation HEM: negative for cervical or periauricular lymphadenopathies CVS: RRR, nl s1 & s2, no murmurs, no edema RESP: no IWOB, good air movement bilaterally, CTAB GI: BS present & normal, soft, NTND SKIN: no apparent skin lesion NEURO: alert and oiented appropriately, no gross deficits  PSYCH: euthymic mood with congruent affect    Assessment and Plan:  1. Acute bronchitis, unspecified organism: History suggestive for acute bronchitis.  Started with viral URI.  There is also some component of allergic rhinitis with postnasal dripping contributing to her cough.  Given productive cough with greenish phlegm and history of asthma, will treat with Z-Pak for 5 days. Will hold off steroid.  She has no increased work of breathing.  Lung exam within normal limits.  She  is satting at 97% on room air.  Refilled her Flonase and discussed about appropriate use as well.  I also  recommended a tablespoonful of honey before bedtime and adequate hydration.  Discussed return precautions as well.  2. Allergic rhinitis, unspecified seasonality, unspecified trigger - fluticasone (FLONASE) 50 MCG/ACT nasal spray; TWO SPRAYS EACH NOSTRIL ONCE A DAY FOR NASAL CONGESTION OR DRAINAGE  Dispense: 16 g; Refill: 5  Return if symptoms worsen or fail to improve. Recommended follow-up with PCP for annual physical.  Almon Herculesaye T Gonfa, MD 10/18/17 Pager: [phone]

## 2017-10-28 DIAGNOSIS — M797 Fibromyalgia: Secondary | ICD-10-CM | POA: Diagnosis not present

## 2017-10-28 DIAGNOSIS — G43009 Migraine without aura, not intractable, without status migrainosus: Secondary | ICD-10-CM | POA: Diagnosis not present

## 2017-10-28 DIAGNOSIS — M5441 Lumbago with sciatica, right side: Secondary | ICD-10-CM | POA: Diagnosis not present

## 2017-10-28 DIAGNOSIS — M5442 Lumbago with sciatica, left side: Secondary | ICD-10-CM | POA: Diagnosis not present

## 2017-10-28 DIAGNOSIS — M542 Cervicalgia: Secondary | ICD-10-CM | POA: Diagnosis not present

## 2017-10-28 DIAGNOSIS — M533 Sacrococcygeal disorders, not elsewhere classified: Secondary | ICD-10-CM | POA: Diagnosis not present

## 2017-10-28 DIAGNOSIS — M5126 Other intervertebral disc displacement, lumbar region: Secondary | ICD-10-CM | POA: Diagnosis not present

## 2017-10-28 MED FILL — DULoxetine HCL 60 MG CPEP: 60 | 30 days supply | Qty: 60 | Fill #0

## 2017-10-28 MED FILL — tiZANidine HCL 4 MG TABS: 4 | 30 days supply | Qty: 120 | Fill #0

## 2017-10-28 MED FILL — TOPIRAMATE 100 MG TABLET: 100 | 90 days supply | Qty: 180 | Fill #0

## 2017-10-28 MED FILL — GABAPENTIN 100 MG CAP: 100 | 30 days supply | Qty: 180 | Fill #0

## 2017-11-04 MED FILL — MONTELUKAST SOD 10 MG TAB: 10 | 30 days supply | Qty: 30 | Fill #4

## 2017-11-08 MED FILL — PROMETHAZINE 25 MG TABLET: 25 | 15 days supply | Qty: 30 | Fill #1

## 2017-11-12 DIAGNOSIS — M5441 Lumbago with sciatica, right side: Secondary | ICD-10-CM | POA: Diagnosis not present

## 2017-11-12 DIAGNOSIS — G43009 Migraine without aura, not intractable, without status migrainosus: Secondary | ICD-10-CM | POA: Diagnosis not present

## 2017-11-12 DIAGNOSIS — M542 Cervicalgia: Secondary | ICD-10-CM | POA: Diagnosis not present

## 2017-11-12 DIAGNOSIS — M5126 Other intervertebral disc displacement, lumbar region: Secondary | ICD-10-CM | POA: Diagnosis not present

## 2017-11-12 DIAGNOSIS — M5416 Radiculopathy, lumbar region: Secondary | ICD-10-CM | POA: Diagnosis not present

## 2017-11-12 DIAGNOSIS — M5442 Lumbago with sciatica, left side: Secondary | ICD-10-CM | POA: Diagnosis not present

## 2017-11-12 DIAGNOSIS — M797 Fibromyalgia: Secondary | ICD-10-CM | POA: Diagnosis not present

## 2017-11-12 MED FILL — BACLOFEN 10 MG TABLET: 10 | 30 days supply | Qty: 90 | Fill #0

## 2017-11-12 MED FILL — GABAPENTIN 300 MG CAPSULE: 300 | 30 days supply | Qty: 120 | Fill #0

## 2017-11-15 MED FILL — clonazePAM 1 MG TABS: 1 | 30 days supply | Qty: 60 | Fill #2

## 2017-11-20 DIAGNOSIS — H524 Presbyopia: Secondary | ICD-10-CM | POA: Diagnosis not present

## 2017-11-23 MED FILL — tiZANidine HCL 4 MG TABS: 4 | 30 days supply | Qty: 120 | Fill #0

## 2017-11-29 MED FILL — DULoxetine HCL 60 MG CPEP: 60 | 30 days supply | Qty: 60 | Fill #1

## 2017-11-29 MED FILL — QUETIAPINE FUMARATE 300 MG: 300 | 90 days supply | Qty: 90 | Fill #1

## 2017-12-06 MED FILL — MONTELUKAST SOD 10 MG TAB: 10 | 30 days supply | Qty: 30 | Fill #5

## 2017-12-10 DIAGNOSIS — F419 Anxiety disorder, unspecified: Secondary | ICD-10-CM | POA: Diagnosis not present

## 2017-12-10 DIAGNOSIS — F319 Bipolar disorder, unspecified: Secondary | ICD-10-CM | POA: Diagnosis not present

## 2017-12-10 MED FILL — FLUoxetine HCL 20 MG CAPS: 20 | 90 days supply | Qty: 90 | Fill #0

## 2017-12-15 MED FILL — clonazePAM 1 MG TABS: 1 | 30 days supply | Qty: 60 | Fill #0

## 2017-12-16 MED FILL — GABAPENTIN 300 MG CAPSULE: 300 | 30 days supply | Qty: 120 | Fill #1

## 2017-12-20 MED FILL — tiZANidine HCL 4 MG TABS: 4 | 30 days supply | Qty: 120 | Fill #1

## 2017-12-29 MED FILL — ATORVASTATIN 40 MG TABLET: 40 | 90 days supply | Qty: 90 | Fill #2

## 2017-12-30 MED FILL — DULoxetine HCL 60 MG CPEP: 60 | 30 days supply | Qty: 60 | Fill #2

## 2018-01-03 ENCOUNTER — Other Ambulatory Visit: Payer: Self-pay | Admitting: Family Medicine

## 2018-01-03 MED FILL — MONTELUKAST SOD 10 MG TAB: 10 | 30 days supply | Qty: 30 | Fill #0

## 2018-01-10 MED FILL — BACLOFEN 10 MG TABLET: 10 | 30 days supply | Qty: 60 | Fill #0

## 2018-01-13 MED FILL — clonazePAM 1 MG TABS: 1 | 30 days supply | Qty: 60 | Fill #1

## 2018-01-24 MED FILL — GABAPENTIN 300 MG CAPSULE: 300 | 30 days supply | Qty: 120 | Fill #2

## 2018-01-31 MED FILL — DULoxetine HCL 60 MG CPEP: 60 | 30 days supply | Qty: 60 | Fill #3

## 2018-01-31 MED FILL — TOPIRAMATE 100 MG TABLET: 100 | 90 days supply | Qty: 180 | Fill #1

## 2018-02-03 MED FILL — MONTELUKAST SOD 10 MG TAB: 10 | 30 days supply | Qty: 30 | Fill #1

## 2018-02-07 MED FILL — BACLOFEN 10 MG TABLET: 10 | 30 days supply | Qty: 60 | Fill #1

## 2018-02-11 ENCOUNTER — Ambulatory Visit: Payer: 59 | Admitting: Family Medicine

## 2018-02-14 MED FILL — clonazePAM 1 MG TABS: 1 | 30 days supply | Qty: 60 | Fill #2

## 2018-02-15 ENCOUNTER — Other Ambulatory Visit: Payer: Self-pay | Admitting: *Deleted

## 2018-02-16 MED ORDER — LINACLOTIDE 290 MCG PO CAPS: 290.0000 ug | ORAL_CAPSULE | Freq: Every day | ORAL | 2 refills | Status: DC

## 2018-02-16 MED FILL — LINZESS 290 MCG CAPSULE: 290 | 30 days supply | Qty: 30 | Fill #0

## 2018-02-21 MED FILL — QUETIAPINE FUMARATE 300 MG: 300 | 30 days supply | Qty: 90 | Fill #0

## 2018-02-24 DIAGNOSIS — H16223 Keratoconjunctivitis sicca, not specified as Sjogren's, bilateral: Secondary | ICD-10-CM | POA: Diagnosis not present

## 2018-02-24 DIAGNOSIS — H25813 Combined forms of age-related cataract, bilateral: Secondary | ICD-10-CM | POA: Diagnosis not present

## 2018-02-24 MED FILL — RESTASIS 0.05% EYE EMULSION: 0.05 | 90 days supply | Qty: 180 | Fill #0

## 2018-02-25 ENCOUNTER — Ambulatory Visit (INDEPENDENT_AMBULATORY_CARE_PROVIDER_SITE_OTHER): Payer: 59 | Admitting: Family Medicine

## 2018-02-25 ENCOUNTER — Encounter: Payer: Self-pay | Admitting: Family Medicine

## 2018-02-25 ENCOUNTER — Other Ambulatory Visit: Payer: Self-pay

## 2018-02-25 VITALS — BP 135/72 | HR 91 | Temp 98.5°F | Ht 63.5 in | Wt 162.0 lb

## 2018-02-25 DIAGNOSIS — E1165 Type 2 diabetes mellitus with hyperglycemia: Secondary | ICD-10-CM

## 2018-02-25 DIAGNOSIS — Z1211 Encounter for screening for malignant neoplasm of colon: Secondary | ICD-10-CM | POA: Diagnosis not present

## 2018-02-25 DIAGNOSIS — Z1239 Encounter for other screening for malignant neoplasm of breast: Secondary | ICD-10-CM

## 2018-02-25 DIAGNOSIS — Z1231 Encounter for screening mammogram for malignant neoplasm of breast: Secondary | ICD-10-CM | POA: Diagnosis not present

## 2018-02-25 DIAGNOSIS — I10 Essential (primary) hypertension: Secondary | ICD-10-CM | POA: Diagnosis not present

## 2018-02-25 DIAGNOSIS — E782 Mixed hyperlipidemia: Secondary | ICD-10-CM | POA: Diagnosis not present

## 2018-02-25 DIAGNOSIS — Z1159 Encounter for screening for other viral diseases: Secondary | ICD-10-CM

## 2018-02-25 LAB — POCT GLYCOSYLATED HEMOGLOBIN (HGB A1C): Hemoglobin A1C: >15

## 2018-02-25 MED ORDER — METFORMIN HCL 1000 MG PO TABS: 1000.0000 mg | ORAL_TABLET | Freq: Two times a day (BID) | ORAL | 2 refills | Status: DC

## 2018-02-25 MED ORDER — CLINDAMYCIN PHOS-BENZOYL PEROX 1.2-5 % EX GEL
1.0000 | Freq: Two times a day (BID) | CUTANEOUS | 0 refills | Status: DC
Start: 2018-02-25 — End: 2020-02-20

## 2018-02-25 MED ORDER — ONETOUCH ULTRA 2 W/DEVICE KIT: 1.0000 | PACK | Freq: Once | 0 refills | Status: AC

## 2018-02-25 MED FILL — metFORMIN HCL 1000 MG TABS: 1000 | 30 days supply | Qty: 60 | Fill #0

## 2018-02-25 MED FILL — FREESTYLE LITE TEST STRIP: 90 days supply | Qty: 100 | Fill #0

## 2018-02-25 MED FILL — CLIND PH-BENZOYL PEROX 1.2-: 1.2-5 | 30 days supply | Qty: 45 | Fill #0

## 2018-02-25 MED FILL — FREESTYLE LANCETS: 90 days supply | Qty: 100 | Fill #0

## 2018-02-25 MED FILL — FREESTYLE LITE METER: 30 days supply | Qty: 1 | Fill #0

## 2018-02-25 NOTE — Progress Notes (Signed)
    Subjective:    Patient ID: Kelly Shannon, female    DOB: 11/11/1950, 67 y.o.   MRN: [id number]   CC: physical exam  Health maintenance Due for colonoscopy, mammogram HIV, hep C Tetanus shot Eye and foot exam  Went to eye doctor yesterday- she told them to send us a report  Overall she is feeling well and denies any complaints or concerns. She is surprised that A1C is high. She denies eating a lot of sweets. Reports she favors carb heavy foods like pastas, rice, potatoes, breads. She denies any symptoms of increased thirst, hunger, or urination. She is very motivated to make diet changes and restart medications. She used to take metformin and tolerated this medication well. She denies remembering if she was ever on insulin in the past.  She reports trouble with acne and would like medication for this.   Smoking status reviewed- non-smoker  Review of Systems- see HPI   Objective:  BP 135/72   Pulse 91   Temp 98.5 F (36.9 C) (Oral)   Ht 5' 3.5" (1.613 m)   Wt 162 lb (73.5 kg)   SpO2 95%   BMI 28.25 kg/m  Vitals and nursing note reviewed  General: well nourished, in no acute distress HEENT: normocephalic, MMM Neck: supple, non-tender, without lymphadenopathy Cardiac: RRR, clear S1 and S2, no murmurs, rubs, or gallops Respiratory: clear to auscultation bilaterally, no increased work of breathing Abdomen: soft, nontender, nondistended, no masses or organomegaly. Bowel sounds present Extremities: no edema or cyanosis. +2 DP bilaterally.  Skin: warm and dry, no rashes noted Neuro: alert and oriented, no focal deficits  Assessment & Plan:    1. Type 2 diabetes mellitus with hyperglycemia, without long-term current use of insulin (HCC) A1C elevated to >15. She is asymptomatic. She has tolerated metformin well in the past. Will start with 1000 mg every morning for 1 week then increase to 1000 mg BID. Meter sent in to pharmacy. Asked patient to monitor sugars at least  every morning. Will likely need another agent and will see how sugars are after diet modification and metformin before adding this.  - HgB A1c  2. Screening for breast cancer Due for mammogram, order placed - MM Digital Screening; Future  3. Special screening for malignant neoplasms, colon Due for colonoscopy, referral placed - Ambulatory referral to Gastroenterology  4. Mixed hyperlipidemia She is on lipitor 40 mg daily, will check lipid panel today - Lipid panel  5. Essential hypertension BP at goal well <140/90, controlled without medications. Check labs today. Closely monitor BP at future visits. Would like to start ACE, will discuss with patient at next visit. - CBC - Basic metabolic panel  6. Screening for viral disease Due for HIV and hep C screening - HIV antibody - Hepatitis C antibody   Return in about 1 month (around 03/25/2018).   Dolores PattyAngela Mishael Krysiak, DO Family Medicine Resident PGY-2

## 2018-02-25 NOTE — Patient Instructions (Addendum)
   Diet Recommendations for Diabetes   Starchy (carb) foods: Bread, rice, pasta, potatoes, corn, cereal, grits, crackers, bagels, muffins, all baked goods.  (Fruits, milk, and yogurt also have carbohydrate, but most of these foods will not spike your blood sugar as the starchy foods will.)  A few fruits do cause high blood sugars; use small portions of bananas (limit to 1/2 at a time), grapes, watermelon, oranges, and most tropical fruits.    Protein foods: Meat, fish, poultry, eggs, dairy foods, and beans such as pinto and kidney beans (beans also provide carbohydrate).   1. Eat at least 3 meals and 1-2 snacks per day. Never go more than 4-5 hours while awake without eating. Eat breakfast within the first hour of getting up.   2. Limit starchy foods to TWO per meal and ONE per snack. ONE portion of a starchy  food is equal to the following:   - ONE slice of bread (or its equivalent, such as half of a hamburger bun).   - 1/2 cup of a "scoopable" starchy food such as potatoes or rice.   - 15 grams of carbohydrate as shown on food label.  3. Include at every meal: a protein food, a carb food, and vegetables and/or fruit.   - Obtain twice the volume of veg's as protein or carbohydrate foods for both lunch and dinner.   - Fresh or frozen veg's are best.   - Keep frozen veg's on hand for a quick vegetable serving.      Check blood sugars in the morning. Our goal would be fasting sugars <130. If they're higher than this after 1 month of the metformin call me and we'll add another medication.  We will inform you of your lab results from today either by phone or letter.  If you have questions or concerns please do not hesitate to call at [phone].  Dolores PattyAngela Riccio, DO PGY-2, [facility] 02/25/2018 1:59 PM

## 2018-02-26 ENCOUNTER — Telehealth: Payer: Self-pay | Admitting: Family Medicine

## 2018-02-26 LAB — CBC
HEMATOCRIT: 40.3 % (ref 34.0–46.6)
HEMOGLOBIN: 13.2 g/dL (ref 11.1–15.9)
MCH: 29.1 pg (ref 26.6–33.0)
MCHC: 32.8 g/dL (ref 31.5–35.7)
MCV: 89 fL (ref 79–97)
Platelets: 235 10*3/uL (ref 150–379)
RBC: 4.54 x10E6/uL (ref 3.77–5.28)
RDW: 14 % (ref 12.3–15.4)
WBC: 5.4 10*3/uL (ref 3.4–10.8)

## 2018-02-26 LAB — LIPID PANEL
CHOLESTEROL TOTAL: 145 mg/dL (ref 100–199)
Chol/HDL Ratio: 3.3 ratio (ref 0.0–4.4)
HDL: 44 mg/dL (ref >39)
LDL Calculated: 40 mg/dL (ref 0–99)
TRIGLYCERIDES: 304 mg/dL — AB (ref 0–149)
VLDL CHOLESTEROL CAL: 61 mg/dL — AB (ref 5–40)

## 2018-02-26 LAB — BASIC METABOLIC PANEL
BUN/Creatinine Ratio: 15 (ref 12–28)
BUN: 13 mg/dL (ref 8–27)
CO2: 21 mmol/L (ref 20–29)
Calcium: 9 mg/dL (ref 8.7–10.3)
Chloride: 100 mmol/L (ref 96–106)
Creatinine, Ser: 0.88 mg/dL (ref 0.57–1.00)
GFR calc Af Amer: 80 mL/min/{1.73_m2} (ref >59)
GFR calc non Af Amer: 70 mL/min/{1.73_m2} (ref >59)
Glucose: 502 mg/dL (ref 65–99)
Potassium: 4.3 mmol/L (ref 3.5–5.2)
SODIUM: 136 mmol/L (ref 134–144)

## 2018-02-26 LAB — HEPATITIS C ANTIBODY

## 2018-02-26 LAB — HIV ANTIBODY (ROUTINE TESTING W REFLEX): HIV Screen 4th Generation wRfx: NONREACTIVE

## 2018-02-26 NOTE — Telephone Encounter (Addendum)
After Hours Emergency Line  Received page from [facility] for critical value from labs 4/26:  BMP with glucose 502.  Appears patient was seen in clinic yesterday by PCP for physical exam and bloodwork ordered.  Note not yet complete. Per chart review, she is a Type 2 diabetic with A1c15% (4/26) from previous 6.3% in 2017.  I called patient who states she felt well at visit and is currently asymptomatic.  Denies abdominal pain, nausea, vomiting.  No headache, increased thirst, fatigue or increased urination.  I discussed with her that elevated blood sugars can often cause these symptoms and would strongly advise coming to ED for evaluation if she were to feel ill over the weekend.  She agrees to do so vs follow-up in clinic early next week to discuss mediations.    Return precautions to ED discussed.   She was appreciative of the call and expressed good understanding.    Will route to PCP.   Freddrick March MD [place], PGY-2

## 2018-02-26 NOTE — Telephone Encounter (Signed)
Opened in error

## 2018-03-01 NOTE — Telephone Encounter (Signed)
Pt states she is returning call to Dr. Wonda Olds Call back [phone] Shawna Orleans, RN

## 2018-03-02 MED FILL — DULoxetine HCL 60 MG CPEP: 60 | 30 days supply | Qty: 60 | Fill #4

## 2018-03-02 MED FILL — GABAPENTIN 300 MG CAPSULE: 300 | 30 days supply | Qty: 120 | Fill #0

## 2018-03-02 NOTE — Telephone Encounter (Signed)
Returned call to Ms. Kelly Shannon. She had peach pancakes prior to blood draw on Friday. She has been checking sugars. Recent is 195. Taking Metformin twice a day. Tolerating this well. We will plan on continuing metformin 1000 mg twice a day and continue checking sugars. Asked her to call in if these are persistently >130, then we will add another medication. She is agreeable. Discussed working on diet changes again.   Dolores Patty, DO PGY-2, [facility] 03/02/2018 2:05 PM

## 2018-03-03 MED FILL — BACLOFEN 10 MG TABLET: 10 | 30 days supply | Qty: 60 | Fill #0

## 2018-03-04 ENCOUNTER — Encounter: Payer: Self-pay | Admitting: Family Medicine

## 2018-03-07 ENCOUNTER — Telehealth: Payer: Self-pay | Admitting: *Deleted

## 2018-03-07 MED ORDER — LISINOPRIL 5 MG PO TABS: 5.0000 mg | ORAL_TABLET | Freq: Every day | ORAL | 3 refills | Status: DC

## 2018-03-07 MED ORDER — EMPAGLIFLOZIN 10 MG PO TABS: 10.0000 mg | ORAL_TABLET | Freq: Every day | ORAL | 2 refills | Status: DC

## 2018-03-07 MED FILL — JARDIANCE 10 MG TABLET: 10 | 30 days supply | Qty: 30 | Fill #0

## 2018-03-07 MED FILL — MONTELUKAST SOD 10 MG TAB: 10 | 30 days supply | Qty: 30 | Fill #2

## 2018-03-07 MED FILL — LISINOPRIL 5 MG TABLET: 5 | 90 days supply | Qty: 90 | Fill #0

## 2018-03-07 NOTE — Telephone Encounter (Signed)
  Returned call to Ms. Luisa Hart, discussed starting additional med for DM. Sent in Rx for jardiance 10 mg daily. Also started 5 mg lisinopril for cardiorenal protection. Patient reporting headaches. Sugars consistently in mid-200's over weekend. Discussed coming in to be seen in clinic if headaches worsen or if she develops other concerning symptoms like numbness/tingling or weakness to proceed to ED. Asked her to follow up in clinic in 1 month to discuss sugars and watch BP. She is in agreement with plan.  Dolores Patty, DO PGY-2, [facility] 03/07/2018 4:34 PM

## 2018-03-07 NOTE — Telephone Encounter (Signed)
Message left on clinic nurse voice mail - Patient states she was told to call back if blood sugar did not go down.  Returned call to patient.  States FBS was 279 today.  Only takes metformin.  Will route note to Dr. Wonda Olds for advice and call patient back. Kelly Shannon, BSN, RN-BC

## 2018-03-09 DIAGNOSIS — M797 Fibromyalgia: Secondary | ICD-10-CM | POA: Diagnosis not present

## 2018-03-09 DIAGNOSIS — M5441 Lumbago with sciatica, right side: Secondary | ICD-10-CM | POA: Diagnosis not present

## 2018-03-09 DIAGNOSIS — G43009 Migraine without aura, not intractable, without status migrainosus: Secondary | ICD-10-CM | POA: Diagnosis not present

## 2018-03-09 DIAGNOSIS — M5126 Other intervertebral disc displacement, lumbar region: Secondary | ICD-10-CM | POA: Diagnosis not present

## 2018-03-09 DIAGNOSIS — M5442 Lumbago with sciatica, left side: Secondary | ICD-10-CM | POA: Diagnosis not present

## 2018-03-09 MED FILL — tiZANidine HCL 4 MG TABS: 4 | 30 days supply | Qty: 30 | Fill #0

## 2018-03-18 DIAGNOSIS — F319 Bipolar disorder, unspecified: Secondary | ICD-10-CM | POA: Diagnosis not present

## 2018-03-18 DIAGNOSIS — F419 Anxiety disorder, unspecified: Secondary | ICD-10-CM | POA: Diagnosis not present

## 2018-03-18 MED FILL — clonazePAM 1 MG TABS: 1 | 30 days supply | Qty: 60 | Fill #0

## 2018-03-21 MED FILL — metFORMIN HCL 1000 MG TABS: 1000 | 30 days supply | Qty: 60 | Fill #1

## 2018-03-22 ENCOUNTER — Other Ambulatory Visit: Payer: Self-pay | Admitting: Family Medicine

## 2018-03-22 MED ORDER — ALBUTEROL SULFATE HFA 108 (90 BASE) MCG/ACT IN AERS: 2.0000 | INHALATION_SPRAY | RESPIRATORY_TRACT | 1 refills | Status: DC | PRN

## 2018-03-22 MED FILL — VENTOLIN HFA 90 MCG INHALER: 108 (90 BAS | 17 days supply | Qty: 18 | Fill #0

## 2018-03-22 NOTE — Telephone Encounter (Signed)
Needs Ventolin inhaler refilled to [facility] pharmacy.

## 2018-03-30 MED FILL — ATORVASTATIN 40 MG TABLET: 40 | 90 days supply | Qty: 90 | Fill #3

## 2018-03-31 MED FILL — BACLOFEN 10 MG TABLET: 10 | 30 days supply | Qty: 60 | Fill #0

## 2018-03-31 MED FILL — DULoxetine HCL 60 MG CPEP: 60 | 30 days supply | Qty: 60 | Fill #5

## 2018-04-04 MED FILL — GABAPENTIN 300 MG CAPSULE: 300 | 30 days supply | Qty: 120 | Fill #0

## 2018-04-07 MED FILL — JARDIANCE 10 MG TABLET: 10 | 30 days supply | Qty: 30 | Fill #1

## 2018-04-07 MED FILL — tiZANidine HCL 4 MG TABS: 4 | 30 days supply | Qty: 30 | Fill #1

## 2018-04-07 MED FILL — MONTELUKAST SOD 10 MG TAB: 10 | 30 days supply | Qty: 30 | Fill #3

## 2018-04-14 MED FILL — TOPIRAMATE 100 MG TABLET: 100 | 90 days supply | Qty: 270 | Fill #0

## 2018-04-18 ENCOUNTER — Ambulatory Visit (INDEPENDENT_AMBULATORY_CARE_PROVIDER_SITE_OTHER): Payer: 59 | Admitting: Family Medicine

## 2018-04-18 ENCOUNTER — Encounter: Payer: Self-pay | Admitting: Family Medicine

## 2018-04-18 ENCOUNTER — Other Ambulatory Visit: Payer: Self-pay

## 2018-04-18 VITALS — BP 126/64 | HR 67 | Temp 97.5°F | Ht 64.0 in | Wt 159.0 lb

## 2018-04-18 DIAGNOSIS — R05 Cough: Secondary | ICD-10-CM

## 2018-04-18 DIAGNOSIS — E119 Type 2 diabetes mellitus without complications: Secondary | ICD-10-CM | POA: Diagnosis not present

## 2018-04-18 DIAGNOSIS — R059 Cough, unspecified: Secondary | ICD-10-CM

## 2018-04-18 MED ORDER — LOSARTAN POTASSIUM 25 MG PO TABS: 25.0000 mg | ORAL_TABLET | Freq: Every day | ORAL | 0 refills | Status: DC

## 2018-04-18 MED ORDER — METFORMIN HCL 1000 MG PO TABS: 1000.0000 mg | ORAL_TABLET | Freq: Two times a day (BID) | ORAL | 2 refills | Status: DC

## 2018-04-18 MED FILL — clonazePAM 1 MG TABS: 1 | 30 days supply | Qty: 60 | Fill #1

## 2018-04-18 MED FILL — LOSARTAN POTASSIUM 25 MG TA: 25 | 30 days supply | Qty: 30 | Fill #0

## 2018-04-18 MED FILL — metFORMIN HCL 1000 MG TABS: 1000 | 30 days supply | Qty: 60 | Fill #2

## 2018-04-18 NOTE — Progress Notes (Signed)
    Subjective:    Patient ID: Kelly Shannon, female    DOB: 01/09/1951, 67 y.o.   MRN: [id number]   CC: follow up DM  HPI:  Taking metformin 1000 BID and jardiance 10 mg daily. Tolerating these well without any side effects. Sugars 150-200 per review of patient's meter. Denies increased thirst, hunger, urination  On lisinopril and statin. Started having an intense dry hacking cough about 2 weeks ago. Cannot get it to go away. Denies seasonal allergies. Denies sore throat, congestion, fever, chills. She is having difficulty sleeping at night due to cough.   Smoking status reviewed- non-smoker  Review of Systems- see HPI   Objective:  BP 126/64   Pulse 67   Temp (!) 97.5 F (36.4 C) (Oral)   Ht 5\' 4"  (1.626 m)   Wt 159 lb (72.1 kg)   SpO2 97%   BMI 27.29 kg/m  Vitals and nursing note reviewed  General: well nourished, in no acute distress HEENT: normocephalic, MMM Cardiac: RRR, clear S1 and S2, no murmurs, rubs, or gallops Respiratory: clear to auscultation bilaterally, no increased work of breathing Extremities: no edema or cyanosis Skin: warm and dry, no rashes noted Neuro: alert and oriented, no focal deficits  Assessment & Plan:   1. Type 2 diabetes mellitus without complication, without long-term current use of insulin (HCC) Continue current regimen, will follow up 1 month to repeat A1C. Anticipate this will be much improved based on CBGs from patient's meter. Will make further adjustments if necessary at that visit.   2. Cough Likely due to recent addition of lisinopril. Will switch to low dose losartan. Follow up 1 month. If no improvement asked patient to call in and let me know. Patient verbalized understanding and agreement with plan.    Return in about 1 month (around 05/16/2018).   Dolores PattyAngela Alder Murri, DO Family Medicine Resident PGY-2

## 2018-04-18 NOTE — Patient Instructions (Signed)
   Great seeing you today! Keep up the good work with your sugars.  Let's STOP lisinopril. I sent in a new medication called losartan to your pharmacy. This will protect kidneys from diabetes.   Please let me know if cough persists over the next 2 weeks.  I'll see you back in 1 month.  If you have questions or concerns please do not hesitate to call at [phone].  Dolores PattyAngela Yoko Mcgahee, DO PGY-2, [facility] 04/18/2018 9:00 AM

## 2018-05-02 MED FILL — GABAPENTIN 300 MG CAPSULE: 300 | 30 days supply | Qty: 120 | Fill #0

## 2018-05-02 MED FILL — DULoxetine HCL 60 MG CPEP: 60 | 30 days supply | Qty: 60 | Fill #0

## 2018-05-02 MED FILL — BACLOFEN 10 MG TABLET: 10 | 30 days supply | Qty: 60 | Fill #0

## 2018-05-02 MED FILL — JARDIANCE 10 MG TABLET: 10 | 30 days supply | Qty: 30 | Fill #2

## 2018-05-06 MED FILL — tiZANidine HCL 4 MG TABS: 4 | 30 days supply | Qty: 30 | Fill #0

## 2018-05-06 MED FILL — QUETIAPINE FUMARATE 300 MG: 300 | 30 days supply | Qty: 90 | Fill #1

## 2018-05-09 MED FILL — MONTELUKAST SOD 10 MG TAB: 10 | 30 days supply | Qty: 30 | Fill #4

## 2018-05-16 ENCOUNTER — Ambulatory Visit: Payer: 59 | Admitting: Family Medicine

## 2018-05-16 ENCOUNTER — Other Ambulatory Visit: Payer: Self-pay | Admitting: Family Medicine

## 2018-05-16 MED FILL — metFORMIN HCL 1000 MG TABS: 1000 | 90 days supply | Qty: 180 | Fill #0

## 2018-05-16 MED FILL — clonazePAM 1 MG TABS: 1 | 30 days supply | Qty: 60 | Fill #2

## 2018-05-17 MED FILL — LOSARTAN POTASSIUM 25 MG TA: 25 | 90 days supply | Qty: 90 | Fill #0

## 2018-05-24 MED FILL — BACLOFEN 10 MG TABLET: 10 | 30 days supply | Qty: 90 | Fill #0

## 2018-05-30 ENCOUNTER — Other Ambulatory Visit: Payer: Self-pay

## 2018-05-31 MED ORDER — GLUCOSE BLOOD VI STRP: ORAL_STRIP | 12 refills | Status: DC

## 2018-06-01 ENCOUNTER — Other Ambulatory Visit: Payer: Self-pay | Admitting: Family Medicine

## 2018-06-01 MED FILL — tiZANidine HCL 4 MG TABS: 4 | 30 days supply | Qty: 30 | Fill #1

## 2018-06-01 MED FILL — JARDIANCE 10 MG TABLET: 10 | 30 days supply | Qty: 30 | Fill #0

## 2018-06-01 MED FILL — FREESTYLE LITE TEST STRIP: 90 days supply | Qty: 100 | Fill #0

## 2018-06-01 MED FILL — DULoxetine HCL 60 MG CPEP: 60 | 30 days supply | Qty: 60 | Fill #1

## 2018-06-01 MED FILL — GABAPENTIN 300 MG CAPSULE: 300 | 30 days supply | Qty: 120 | Fill #1

## 2018-06-01 MED FILL — LINZESS 290 MCG CAPSULE: 290 | 30 days supply | Qty: 30 | Fill #1

## 2018-06-08 DIAGNOSIS — H10029 Other mucopurulent conjunctivitis, unspecified eye: Secondary | ICD-10-CM | POA: Diagnosis not present

## 2018-06-08 MED FILL — MONTELUKAST SOD 10 MG TAB: 10 | 30 days supply | Qty: 30 | Fill #5

## 2018-06-15 DIAGNOSIS — F319 Bipolar disorder, unspecified: Secondary | ICD-10-CM | POA: Diagnosis not present

## 2018-06-15 DIAGNOSIS — F411 Generalized anxiety disorder: Secondary | ICD-10-CM | POA: Diagnosis not present

## 2018-06-17 MED FILL — clonazePAM 1 MG TABS: 1 | 30 days supply | Qty: 60 | Fill #0 | Status: TO

## 2018-06-30 ENCOUNTER — Other Ambulatory Visit: Payer: Self-pay | Admitting: Family Medicine

## 2018-06-30 MED FILL — JARDIANCE 10 MG TABLET: 10 | 30 days supply | Qty: 30 | Fill #1

## 2018-06-30 MED FILL — ATORVASTATIN 40 MG TABLET: 40 | 90 days supply | Qty: 90 | Fill #0

## 2018-07-01 MED FILL — DULoxetine HCL 60 MG CPEP: 60 | 30 days supply | Qty: 60 | Fill #0

## 2018-07-07 MED FILL — TOPIRAMATE 100 MG TABLET: 100 | 90 days supply | Qty: 270 | Fill #1

## 2018-07-07 MED FILL — GABAPENTIN 300 MG CAPSULE: 300 | 30 days supply | Qty: 120 | Fill #0

## 2018-07-07 MED FILL — tiZANidine HCL 4 MG TABS: 4 | 30 days supply | Qty: 30 | Fill #0

## 2018-07-07 MED FILL — BACLOFEN 10 MG TABLET: 10 | 30 days supply | Qty: 90 | Fill #1

## 2018-07-11 ENCOUNTER — Other Ambulatory Visit: Payer: Self-pay | Admitting: Family Medicine

## 2018-07-11 MED FILL — MONTELUKAST SOD 10 MG TAB: 10 | 30 days supply | Qty: 30 | Fill #0

## 2018-07-18 DIAGNOSIS — H02834 Dermatochalasis of left upper eyelid: Secondary | ICD-10-CM | POA: Diagnosis not present

## 2018-07-18 DIAGNOSIS — H53453 Other localized visual field defect, bilateral: Secondary | ICD-10-CM | POA: Diagnosis not present

## 2018-07-18 DIAGNOSIS — H02831 Dermatochalasis of right upper eyelid: Secondary | ICD-10-CM | POA: Diagnosis not present

## 2018-07-18 MED FILL — LINZESS 290 MCG CAPSULE: 290 | 30 days supply | Qty: 30 | Fill #2

## 2018-07-18 MED FILL — clonazePAM 1 MG TABS: 1 | 30 days supply | Qty: 60 | Fill #0

## 2018-07-19 DIAGNOSIS — M5442 Lumbago with sciatica, left side: Secondary | ICD-10-CM | POA: Diagnosis not present

## 2018-07-19 DIAGNOSIS — M5441 Lumbago with sciatica, right side: Secondary | ICD-10-CM | POA: Diagnosis not present

## 2018-07-19 DIAGNOSIS — M797 Fibromyalgia: Secondary | ICD-10-CM | POA: Diagnosis not present

## 2018-07-19 DIAGNOSIS — G43009 Migraine without aura, not intractable, without status migrainosus: Secondary | ICD-10-CM | POA: Diagnosis not present

## 2018-07-19 DIAGNOSIS — M5416 Radiculopathy, lumbar region: Secondary | ICD-10-CM | POA: Diagnosis not present

## 2018-07-19 MED FILL — RIZATRIPTAN BENZOATE 10 MG: 10 | 30 days supply | Qty: 8 | Fill #0

## 2018-07-25 MED FILL — QUETIAPINE FUMARATE 300 MG: 300 | 90 days supply | Qty: 90 | Fill #0

## 2018-07-25 MED FILL — LOTEPREDNOL ETABONATE 0.5 %: 0.5 | 13 days supply | Qty: 5 | Fill #0

## 2018-08-01 MED FILL — JARDIANCE 10 MG TABLET: 10 | 30 days supply | Qty: 30 | Fill #2

## 2018-08-01 MED FILL — DULoxetine HCL 60 MG CPEP: 60 | 30 days supply | Qty: 60 | Fill #0

## 2018-08-01 MED FILL — PROMETHAZINE 25 MG TABLET: 25 | 15 days supply | Qty: 30 | Fill #0

## 2018-08-10 MED FILL — MONTELUKAST SOD 10 MG TAB: 10 | 30 days supply | Qty: 30 | Fill #1

## 2018-08-10 MED FILL — RESTASIS 0.05% EYE EMULSION: 0.05 | 30 days supply | Qty: 60 | Fill #0

## 2018-08-16 MED FILL — GABAPENTIN 300 MG CAPSULE: 300 | 30 days supply | Qty: 120 | Fill #0

## 2018-08-16 MED FILL — clonazePAM 1 MG TABS: 1 | 30 days supply | Qty: 60 | Fill #1

## 2018-08-18 ENCOUNTER — Other Ambulatory Visit: Payer: Self-pay | Admitting: Family Medicine

## 2018-08-18 MED FILL — metFORMIN HCL 1000 MG TABS: 1000 | 90 days supply | Qty: 180 | Fill #1

## 2018-08-18 MED FILL — LOSARTAN POTASSIUM 25 MG TA: 25 | 90 days supply | Qty: 90 | Fill #1

## 2018-08-19 MED FILL — LINZESS 290 MCG CAPSULE: 290 | 30 days supply | Qty: 30 | Fill #0

## 2018-08-22 MED FILL — BACLOFEN 10 MG TABLET: 10 | 30 days supply | Qty: 90 | Fill #0

## 2018-08-31 ENCOUNTER — Other Ambulatory Visit: Payer: Self-pay | Admitting: Family Medicine

## 2018-08-31 MED FILL — tiZANidine HCL 4 MG TABS: 4 | 30 days supply | Qty: 30 | Fill #0

## 2018-08-31 MED FILL — DULoxetine HCL 60 MG CPEP: 60 | 30 days supply | Qty: 60 | Fill #1

## 2018-08-31 MED FILL — JARDIANCE 10 MG TABLET: 10 | 30 days supply | Qty: 30 | Fill #0

## 2018-08-31 NOTE — Telephone Encounter (Signed)
I am covering Dr. Purnell Shoemaker inbox. Looks like this patient was due to return in 1 month from last visit for a1c (July/Aug), please have her schedule DM follow up.

## 2018-09-01 NOTE — Telephone Encounter (Signed)
Spoke with patient and she said that she will call us back once she looks at her calendar.  Arius Harnois,CMA

## 2018-09-08 DIAGNOSIS — H04123 Dry eye syndrome of bilateral lacrimal glands: Secondary | ICD-10-CM | POA: Diagnosis not present

## 2018-09-08 DIAGNOSIS — F319 Bipolar disorder, unspecified: Secondary | ICD-10-CM | POA: Diagnosis not present

## 2018-09-08 DIAGNOSIS — F411 Generalized anxiety disorder: Secondary | ICD-10-CM | POA: Diagnosis not present

## 2018-09-08 MED FILL — QUETIAPINE FUMARATE 400 MG: 400 | 90 days supply | Qty: 90 | Fill #0

## 2018-09-08 MED FILL — MONTELUKAST SOD 10 MG TAB: 10 | 30 days supply | Qty: 30 | Fill #2

## 2018-09-08 MED FILL — FLUoxetine HCL 20 MG CAPS: 20 | 90 days supply | Qty: 90 | Fill #0

## 2018-09-09 DIAGNOSIS — H5340 Unspecified visual field defects: Secondary | ICD-10-CM | POA: Diagnosis not present

## 2018-09-13 MED FILL — clonazePAM 1 MG TABS: 1 | 30 days supply | Qty: 60 | Fill #0

## 2018-09-23 ENCOUNTER — Ambulatory Visit (INDEPENDENT_AMBULATORY_CARE_PROVIDER_SITE_OTHER): Payer: 59 | Admitting: Family Medicine

## 2018-09-23 VITALS — BP 110/70 | HR 71 | Temp 98.0°F | Wt 144.6 lb

## 2018-09-23 DIAGNOSIS — K5900 Constipation, unspecified: Secondary | ICD-10-CM | POA: Diagnosis not present

## 2018-09-23 DIAGNOSIS — Z1211 Encounter for screening for malignant neoplasm of colon: Secondary | ICD-10-CM

## 2018-09-23 DIAGNOSIS — E119 Type 2 diabetes mellitus without complications: Secondary | ICD-10-CM

## 2018-09-23 DIAGNOSIS — F319 Bipolar disorder, unspecified: Secondary | ICD-10-CM

## 2018-09-23 DIAGNOSIS — Z23 Encounter for immunization: Secondary | ICD-10-CM

## 2018-09-23 DIAGNOSIS — E1165 Type 2 diabetes mellitus with hyperglycemia: Secondary | ICD-10-CM

## 2018-09-23 DIAGNOSIS — F315 Bipolar disorder, current episode depressed, severe, with psychotic features: Secondary | ICD-10-CM | POA: Diagnosis not present

## 2018-09-23 LAB — POCT GLYCOSYLATED HEMOGLOBIN (HGB A1C): HbA1c, POC (controlled diabetic range): 5.8 % (ref 0.0–7.0)

## 2018-09-23 MED ORDER — METFORMIN HCL 1000 MG PO TABS: 1000.0000 mg | ORAL_TABLET | Freq: Every day | ORAL | 2 refills | Status: DC

## 2018-09-23 MED ORDER — LINACLOTIDE 290 MCG PO CAPS: ORAL_CAPSULE | ORAL | 2 refills | Status: DC

## 2018-09-23 MED FILL — BACLOFEN 10 MG TABLET: 10 | 30 days supply | Qty: 90 | Fill #1

## 2018-09-23 MED FILL — LINZESS 290 MCG CAPSULE: 290 | 30 days supply | Qty: 30 | Fill #0

## 2018-09-23 MED FILL — GABAPENTIN 300 MG CAPSULE: 300 | 30 days supply | Qty: 120 | Fill #0

## 2018-09-23 NOTE — Assessment & Plan Note (Signed)
  A1C much improved to 5.8 today with medicaitons, diet changes, and weight loss. Praised efforts. Will decrease metformin to 1000 mg daily and continue jardiance 10 mg daily. Follow up 3 months to repeat a1c. Patient verbalized understanding and agreement with plan.

## 2018-09-23 NOTE — Patient Instructions (Addendum)
  Great to see you today!  Let's decrease your metformin to once a day.  Continue the jardiance  I ordered the cologuard test and it will be delivered to your home. I refilled linzess for you.   We'll see you back in 3 months to recheck A1C  If you have questions or concerns please do not hesitate to call at [phone].  Dolores PattyAngela Elainah Rhyne, DO PGY-3, [facility] 09/23/2018 2:32 PM

## 2018-09-23 NOTE — Assessment & Plan Note (Signed)
  Patient declined colonoscopy but wants to get cologuard done. Order placed.

## 2018-09-23 NOTE — Assessment & Plan Note (Signed)
  Stable on chronic meds, follows with psychiatry every 4 months.

## 2018-09-23 NOTE — Assessment & Plan Note (Signed)
-  Refilled linzess 

## 2018-09-23 NOTE — Progress Notes (Signed)
    Subjective:    Patient ID: Kelly Shannon, female    DOB: 03/13/1951, 67 y.o.   MRN: [id number]   CC: follow up DM  HPI: she is on metformin 1000 mg BID and jardiance 10 mg daily.  Tolerating medications well. Has not had any recent episodes of hypoglycemia. This morning CBG was 96 and she felt well. Highest she has seen was 150. She has worked hard to eat better and lose weight. She denies increased thirst, hunger, urination  Bipolar disorder and psych medications are followed by her psychiatrist. She sees them every 4 months.   She does not want a colonoscopy but is open to doing cologuard She is due for flu and pneumonia shot today  Smoking status reviewed- non-smoker  Review of Systems- see HPI   Objective:  BP 110/70   Pulse 71   Temp 98 F (36.7 C) (Oral)   Wt 144 lb 9.6 oz (65.6 kg)   SpO2 96%   BMI 24.82 kg/m  Vitals and nursing note reviewed  General: well nourished, in no acute distress HEENT: normocephalic, MMM Cardiac: RRR, clear S1 and S2, no murmurs, rubs, or gallops Respiratory: clear to auscultation bilaterally, no increased work of breathing Neuro: alert and oriented, no focal deficits   Assessment & Plan:    Bipolar affective disorder, depressed, severe, with psychotic behavior (HCC)  Stable on chronic meds, follows with psychiatry every 4 months.  Type 2 diabetes mellitus (HCC)  A1C much improved to 5.8 today with medicaitons, diet changes, and weight loss. Praised efforts. Will decrease metformin to 1000 mg daily and continue jardiance 10 mg daily. Follow up 3 months to repeat a1c. Patient verbalized understanding and agreement with plan.   Special screening for malignant neoplasms, colon  Patient declined colonoscopy but wants to get cologuard done. Order placed.     Return in about 3 months (around 12/24/2018).   Dolores PattyAngela Maikel Neisler, DO Family Medicine Resident PGY-3

## 2018-09-26 ENCOUNTER — Other Ambulatory Visit: Payer: Self-pay | Admitting: Family Medicine

## 2018-09-26 MED FILL — ATORVASTATIN 40 MG TABLET: 40 | 90 days supply | Qty: 90 | Fill #1

## 2018-09-27 MED FILL — JARDIANCE 10 MG TABLET: 10 | 30 days supply | Qty: 30 | Fill #0

## 2018-10-03 MED FILL — tiZANidine HCL 4 MG TABS: 4 | 30 days supply | Qty: 30 | Fill #0

## 2018-10-03 MED FILL — DULoxetine HCL 60 MG CPEP: 60 | 30 days supply | Qty: 60 | Fill #2

## 2018-10-11 ENCOUNTER — Telehealth: Payer: Self-pay

## 2018-10-11 NOTE — Telephone Encounter (Signed)
Informed pt we will see her tomorrow at 155pm on ATC.

## 2018-10-11 NOTE — Telephone Encounter (Signed)
She should be seen. Thank you!

## 2018-10-11 NOTE — Telephone Encounter (Signed)
Pt called nurse line stating she has been running a low grade fever, sore throat and watery eyes. Pt wants to know if something can be called in for her since she was just seen by pcp. I informed patient she would more than likely need an apt. I went ahead and scheduled her for ATC for tomorrow just in case. If able to call her something in, please let her know and cancel tomorrows apt.

## 2018-10-12 ENCOUNTER — Ambulatory Visit: Payer: 59

## 2018-10-14 MED FILL — MONTELUKAST SOD 10 MG TAB: 10 | 30 days supply | Qty: 30 | Fill #3

## 2018-10-17 MED FILL — clonazePAM 1 MG TABS: 1 | 30 days supply | Qty: 60 | Fill #0

## 2018-10-21 DIAGNOSIS — F3131 Bipolar disorder, current episode depressed, mild: Secondary | ICD-10-CM | POA: Diagnosis not present

## 2018-10-21 DIAGNOSIS — F411 Generalized anxiety disorder: Secondary | ICD-10-CM | POA: Diagnosis not present

## 2018-10-21 DIAGNOSIS — G4700 Insomnia, unspecified: Secondary | ICD-10-CM | POA: Diagnosis not present

## 2018-10-24 MED FILL — GABAPENTIN 300 MG CAPSULE: 300 | 30 days supply | Qty: 120 | Fill #1

## 2018-10-28 ENCOUNTER — Other Ambulatory Visit: Payer: Self-pay | Admitting: Family Medicine

## 2018-10-28 MED FILL — JARDIANCE 10 MG TABLET: 10 | 30 days supply | Qty: 30 | Fill #0

## 2018-10-31 MED FILL — metFORMIN HCL 1000 MG TABS: 1000 | 90 days supply | Qty: 180 | Fill #2

## 2018-10-31 MED FILL — predniSONE 10 MG TABS: 10 | 6 days supply | Qty: 21 | Fill #0

## 2018-10-31 MED FILL — LINZESS 290 MCG CAPSULE: 290 | 30 days supply | Qty: 30 | Fill #1

## 2018-11-03 MED FILL — BACLOFEN 10 MG TABLET: 10 | 30 days supply | Qty: 60 | Fill #1

## 2018-11-03 MED FILL — QUETIAPINE FUMARATE 300 MG: 300 | 90 days supply | Qty: 90 | Fill #1

## 2018-11-03 MED FILL — tiZANidine HCL 4 MG TABS: 4 | 20 days supply | Qty: 60 | Fill #0

## 2018-11-03 MED FILL — DULoxetine HCL 60 MG CPEP: 60 | 30 days supply | Qty: 60 | Fill #3

## 2018-11-11 MED FILL — MONTELUKAST SOD 10 MG TAB: 10 | 30 days supply | Qty: 30 | Fill #4

## 2018-11-11 MED FILL — PROMETHAZINE 25 MG TABLET: 25 | 15 days supply | Qty: 30 | Fill #1

## 2018-11-16 MED FILL — clonazePAM 1 MG TABS: 1 | 30 days supply | Qty: 60 | Fill #1

## 2018-11-18 MED FILL — LOSARTAN POTASSIUM 25 MG TA: 25 | 90 days supply | Qty: 90 | Fill #2

## 2018-11-28 ENCOUNTER — Other Ambulatory Visit: Payer: Self-pay | Admitting: Family Medicine

## 2018-11-28 MED FILL — GABAPENTIN 300 MG CAPSULE: 300 | 30 days supply | Qty: 120 | Fill #0

## 2018-11-29 NOTE — Telephone Encounter (Signed)
Husband stopped by the office and said that patient is out of this medication.  Jazmin Hartsell,CMA

## 2018-11-30 DIAGNOSIS — H02831 Dermatochalasis of right upper eyelid: Secondary | ICD-10-CM | POA: Diagnosis not present

## 2018-11-30 DIAGNOSIS — H02834 Dermatochalasis of left upper eyelid: Secondary | ICD-10-CM | POA: Diagnosis not present

## 2018-11-30 MED FILL — JARDIANCE 10 MG TABLET: 10 | 30 days supply | Qty: 30 | Fill #0

## 2018-12-02 DIAGNOSIS — F3110 Bipolar disorder, current episode manic without psychotic features, unspecified: Secondary | ICD-10-CM | POA: Diagnosis not present

## 2018-12-02 DIAGNOSIS — G4700 Insomnia, unspecified: Secondary | ICD-10-CM | POA: Diagnosis not present

## 2018-12-02 DIAGNOSIS — F411 Generalized anxiety disorder: Secondary | ICD-10-CM | POA: Diagnosis not present

## 2018-12-05 MED FILL — DULoxetine HCL 60 MG CPEP: 60 | 30 days supply | Qty: 60 | Fill #4

## 2018-12-05 MED FILL — BACLOFEN 10 MG TABLET: 10 | 30 days supply | Qty: 60 | Fill #0

## 2018-12-05 MED FILL — LINZESS 290 MCG CAPSULE: 290 | 30 days supply | Qty: 30 | Fill #2

## 2018-12-07 MED FILL — HYDROCODON-APAP 5-325: 5-325 | 7 days supply | Qty: 28 | Fill #0

## 2018-12-07 MED FILL — PROMETHAZINE 25 MG TABLET: 25 | 3 days supply | Qty: 10 | Fill #0

## 2018-12-07 MED FILL — FLUOROMETHOLONE 0.1% DROPS: 0.1 | 6 days supply | Qty: 5 | Fill #0

## 2018-12-07 MED FILL — LORazepam 1 MG TABS: 1 | 1 days supply | Qty: 1 | Fill #0

## 2018-12-13 MED FILL — QUETIAPINE FUMARATE 400 MG: 400 | 90 days supply | Qty: 90 | Fill #0

## 2018-12-13 MED FILL — MONTELUKAST SOD 10 MG TAB: 10 | 30 days supply | Qty: 30 | Fill #5

## 2018-12-14 DIAGNOSIS — H02834 Dermatochalasis of left upper eyelid: Secondary | ICD-10-CM | POA: Diagnosis not present

## 2018-12-14 DIAGNOSIS — H02831 Dermatochalasis of right upper eyelid: Secondary | ICD-10-CM | POA: Diagnosis not present

## 2018-12-14 MED FILL — clonazePAM 1 MG TABS: 1 | 30 days supply | Qty: 60 | Fill #2

## 2018-12-26 ENCOUNTER — Other Ambulatory Visit: Payer: Self-pay | Admitting: Family Medicine

## 2018-12-26 MED FILL — JARDIANCE 10 MG TABLET: 10 | 30 days supply | Qty: 30 | Fill #0

## 2018-12-26 MED FILL — RIZATRIPTAN BENZOATE 10 MG: 10 | 30 days supply | Qty: 8 | Fill #1

## 2018-12-26 MED FILL — ATORVASTATIN 40 MG TABLET: 40 | 90 days supply | Qty: 90 | Fill #2

## 2018-12-26 MED FILL — tiZANidine HCL 4 MG TABS: 4 | 20 days supply | Qty: 60 | Fill #1

## 2018-12-26 NOTE — Telephone Encounter (Signed)
Will refill jardiance (covering for Dr. Wonda Olds), but patient overdue for appt with Dr. Wonda Olds for DM check. Please call and have patient seen as soon as they can for diabetes follow up.

## 2018-12-27 ENCOUNTER — Other Ambulatory Visit: Payer: Self-pay | Admitting: Family Medicine

## 2018-12-27 NOTE — Telephone Encounter (Signed)
Please call pharmacy and clarify if they meant to ask for an additional refill. I am covering Dr. Wonda Olds and I filled this yesterday.

## 2018-12-29 MED FILL — TOPIRAMATE 100 MG TABLET: 100 | 90 days supply | Qty: 270 | Fill #0

## 2019-01-03 MED FILL — DULoxetine HCL 60 MG CPEP: 60 | 30 days supply | Qty: 60 | Fill #5

## 2019-01-03 MED FILL — BACLOFEN 10 MG TABS: 10 | 30 days supply | Qty: 60 | Fill #0

## 2019-01-04 MED FILL — GABAPENTIN 300 MG CAPSULE: 300 | 30 days supply | Qty: 120 | Fill #0

## 2019-01-10 DIAGNOSIS — G8929 Other chronic pain: Secondary | ICD-10-CM | POA: Diagnosis not present

## 2019-01-10 DIAGNOSIS — G43009 Migraine without aura, not intractable, without status migrainosus: Secondary | ICD-10-CM | POA: Diagnosis not present

## 2019-01-10 DIAGNOSIS — M5441 Lumbago with sciatica, right side: Secondary | ICD-10-CM | POA: Diagnosis not present

## 2019-01-10 DIAGNOSIS — M5442 Lumbago with sciatica, left side: Secondary | ICD-10-CM | POA: Diagnosis not present

## 2019-01-10 DIAGNOSIS — M5416 Radiculopathy, lumbar region: Secondary | ICD-10-CM | POA: Diagnosis not present

## 2019-01-11 ENCOUNTER — Other Ambulatory Visit: Payer: Self-pay | Admitting: Family Medicine

## 2019-01-11 MED FILL — EMGALITY 120 MG/ML SOAJ: 120 | 30 days supply | Qty: 2 | Fill #0 | Status: TO

## 2019-01-11 MED FILL — clonazePAM 1 MG TABS: 1 | 30 days supply | Qty: 60 | Fill #0 | Status: TO

## 2019-01-11 MED FILL — MONTELUKAST SOD 10 MG TAB: 10 | 30 days supply | Qty: 30 | Fill #0 | Status: TO

## 2019-01-16 MED FILL — LINZESS 290 MCG CAPSULE: 290 | 30 days supply | Qty: 30 | Fill #1 | Status: TO

## 2019-01-16 MED FILL — tiZANidine HCL 4 MG TABS: 4 | 20 days supply | Qty: 60 | Fill #0

## 2019-01-20 MED FILL — predniSONE 5 MG TABS: 5 | 6 days supply | Qty: 21 | Fill #0

## 2019-01-26 ENCOUNTER — Other Ambulatory Visit: Payer: Self-pay | Admitting: Family Medicine

## 2019-02-06 MED FILL — tiZANidine HCL 4 MG TABS: 4 | 20 days supply | Qty: 60 | Fill #0

## 2019-02-06 MED FILL — JARDIANCE 10 MG TABLET: 10 | 30 days supply | Qty: 30 | Fill #0

## 2019-02-06 MED FILL — DULOXETINE HCL 60 MG CPEP: 60 | 30 days supply | Qty: 60 | Fill #0

## 2019-02-06 MED FILL — BACLOFEN 10 MG TABS: 10 | 30 days supply | Qty: 60 | Fill #0

## 2019-02-09 MED FILL — clonazePAM 1 MG TABS: 1 | 30 days supply | Qty: 60 | Fill #0

## 2019-02-10 MED FILL — MONTELUKAST SOD 10 MG TAB: 10 | 90 days supply | Qty: 90 | Fill #0

## 2019-02-10 MED FILL — GABAPENTIN 300 MG CAPSULE: 300 | 30 days supply | Qty: 120 | Fill #0

## 2019-02-10 MED FILL — EMGALITY 120 MG/ML SOAJ: 120 | 30 days supply | Qty: 2 | Fill #0

## 2019-02-20 MED FILL — LINZESS 290 MCG CAPSULE: 290 | 30 days supply | Qty: 30 | Fill #0

## 2019-02-23 DIAGNOSIS — F411 Generalized anxiety disorder: Secondary | ICD-10-CM | POA: Diagnosis not present

## 2019-02-23 DIAGNOSIS — F3110 Bipolar disorder, current episode manic without psychotic features, unspecified: Secondary | ICD-10-CM | POA: Diagnosis not present

## 2019-02-23 MED FILL — QUETIAPINE FUMARATE 400 MG: 400 | 90 days supply | Qty: 90 | Fill #0

## 2019-02-23 MED FILL — FLUoxetine HCL 20 MG CAPS: 20 | 90 days supply | Qty: 90 | Fill #0

## 2019-03-09 ENCOUNTER — Other Ambulatory Visit: Payer: Self-pay | Admitting: Family Medicine

## 2019-03-09 MED FILL — BACLOFEN 10 MG TABS: 10 | 30 days supply | Qty: 60 | Fill #0

## 2019-03-09 MED FILL — JARDIANCE 10 MG TABLET: 10 | 30 days supply | Qty: 30 | Fill #0

## 2019-03-09 MED FILL — tiZANidine HCL 4 MG TABS: 4 | 20 days supply | Qty: 60 | Fill #0

## 2019-03-09 NOTE — Telephone Encounter (Signed)
Scheduled

## 2019-03-09 NOTE — Telephone Encounter (Signed)
Please have her come in for visit to follow up on DM- will fill this but would like to see how her A1C is doing she may not need it anymore.

## 2019-03-13 MED FILL — GABAPENTIN 300 MG CAPSULE: 300 | 30 days supply | Qty: 120 | Fill #0

## 2019-03-13 MED FILL — clonazePAM 1 MG TABS: 1 | 30 days supply | Qty: 60 | Fill #1

## 2019-03-17 ENCOUNTER — Ambulatory Visit: Payer: 59 | Admitting: Family Medicine

## 2019-03-20 ENCOUNTER — Ambulatory Visit: Payer: 59 | Admitting: Family Medicine

## 2019-03-24 MED FILL — DULOXETINE HCL 60 MG CPEP: 60 | 30 days supply | Qty: 60 | Fill #1

## 2019-03-24 MED FILL — EMGALITY 120 MG/ML SOAJ: 120 | 30 days supply | Qty: 1 | Fill #1

## 2019-04-10 DIAGNOSIS — F411 Generalized anxiety disorder: Secondary | ICD-10-CM | POA: Diagnosis not present

## 2019-04-10 DIAGNOSIS — F3110 Bipolar disorder, current episode manic without psychotic features, unspecified: Secondary | ICD-10-CM | POA: Diagnosis not present

## 2019-04-10 MED FILL — clonazePAM 1 MG TABS: 1 | 30 days supply | Qty: 60 | Fill #0

## 2019-04-10 MED FILL — ESZOPICLONE 2 MG TAB: 2 | 30 days supply | Qty: 30 | Fill #0

## 2019-04-13 MED FILL — tiZANidine HCL 4 MG TABS: 4 | 20 days supply | Qty: 60 | Fill #0

## 2019-04-13 MED FILL — BACLOFEN 10 MG TABS: 10 | 30 days supply | Qty: 60 | Fill #0

## 2019-04-17 DIAGNOSIS — M5441 Lumbago with sciatica, right side: Secondary | ICD-10-CM | POA: Diagnosis not present

## 2019-04-17 DIAGNOSIS — M5442 Lumbago with sciatica, left side: Secondary | ICD-10-CM | POA: Diagnosis not present

## 2019-04-17 DIAGNOSIS — M533 Sacrococcygeal disorders, not elsewhere classified: Secondary | ICD-10-CM | POA: Diagnosis not present

## 2019-04-17 DIAGNOSIS — M542 Cervicalgia: Secondary | ICD-10-CM | POA: Diagnosis not present

## 2019-04-17 DIAGNOSIS — G43009 Migraine without aura, not intractable, without status migrainosus: Secondary | ICD-10-CM | POA: Diagnosis not present

## 2019-04-17 DIAGNOSIS — M797 Fibromyalgia: Secondary | ICD-10-CM | POA: Diagnosis not present

## 2019-04-17 MED FILL — RIZATRIPTAN BENZOATE 10 MG: 10 | 30 days supply | Qty: 8 | Fill #0

## 2019-04-17 MED FILL — EMGALITY 120 MG/ML SOAJ: 120 | 30 days supply | Qty: 1 | Fill #0

## 2019-04-17 MED FILL — TOPIRAMATE 100 MG TABLET: 100 | 90 days supply | Qty: 270 | Fill #0

## 2019-04-17 MED FILL — GABAPENTIN 300 MG CAPSULE: 300 | 30 days supply | Qty: 120 | Fill #0

## 2019-04-17 MED FILL — DULoxetine HCL 60 MG CPEP: 60 | 30 days supply | Qty: 60 | Fill #0

## 2019-04-19 ENCOUNTER — Ambulatory Visit: Payer: 59 | Admitting: Family Medicine

## 2019-04-19 MED FILL — PROMETHAZINE 25 MG TABLET: 25 | 3 days supply | Qty: 10 | Fill #1

## 2019-04-21 ENCOUNTER — Ambulatory Visit (INDEPENDENT_AMBULATORY_CARE_PROVIDER_SITE_OTHER): Payer: 59 | Admitting: Family Medicine

## 2019-04-21 ENCOUNTER — Encounter: Payer: Self-pay | Admitting: Family Medicine

## 2019-04-21 ENCOUNTER — Other Ambulatory Visit: Payer: Self-pay

## 2019-04-21 VITALS — BP 110/58 | HR 81 | Ht 64.0 in | Wt 160.2 lb

## 2019-04-21 DIAGNOSIS — Z1211 Encounter for screening for malignant neoplasm of colon: Secondary | ICD-10-CM | POA: Diagnosis not present

## 2019-04-21 DIAGNOSIS — E119 Type 2 diabetes mellitus without complications: Secondary | ICD-10-CM

## 2019-04-21 DIAGNOSIS — E785 Hyperlipidemia, unspecified: Secondary | ICD-10-CM

## 2019-04-21 LAB — POCT GLYCOSYLATED HEMOGLOBIN (HGB A1C): HbA1c, POC (controlled diabetic range): 6.7 % (ref 0.0–7.0)

## 2019-04-21 MED ORDER — JARDIANCE 10 MG PO TABS: 10.0000 mg | ORAL_TABLET | Freq: Every day | ORAL | 1 refills | Status: DC

## 2019-04-21 MED ORDER — ATORVASTATIN CALCIUM 40 MG PO TABS: 40.0000 mg | ORAL_TABLET | Freq: Every day | ORAL | 3 refills | Status: DC

## 2019-04-21 MED ORDER — METFORMIN HCL 1000 MG PO TABS: 1000.0000 mg | ORAL_TABLET | Freq: Every day | ORAL | 3 refills | Status: DC

## 2019-04-21 MED FILL — JARDIANCE 10 MG TABLET: 10 | 90 days supply | Qty: 90 | Fill #0

## 2019-04-21 MED FILL — ATORVASTATIN 40 MG TABLET: 40 | 90 days supply | Qty: 90 | Fill #0

## 2019-04-21 NOTE — Assessment & Plan Note (Signed)
Ordered cologuard.

## 2019-04-21 NOTE — Progress Notes (Signed)
    Subjective:    Patient ID: Kelly Shannon, female    DOB: 02/19/1951, 68 y.o.   MRN: [id number]  CC: diabetes follow up   HPI: patient reports she is "going crazy" in quarantine but otherwise doing well. She is taking metformin 1000 mg daily and 10 mg jardiance daily. She denies increased thirst, hunger, or urination. No symptoms of hypoglycemia. CBGs 99-129 at home.   She is due for several HM items. She refuses colonoscopy. She would consider cologuard. She wants to hold off on mammogram and dexa scan.   Smoking status reviewed- non-smoker   Review of Systems- see HPI   Objective:  BP (!) 110/58   Pulse 81   Ht 5\' 4"  (1.626 m)   Wt 160 lb 4 oz (72.7 kg)   SpO2 98%   BMI 27.51 kg/m  Vitals and nursing note reviewed  General: well nourished, in no acute distress HEENT: normocephalic, MMM Cardiac: RRR, clear S1 and S2, no murmurs, rubs, or gallops Respiratory: clear to auscultation bilaterally, no increased work of breathing Extremities: no edema or cyanosis.  Neuro: alert and oriented, no focal deficits Psych: flat affect  Assessment & Plan:    Special screening for malignant neoplasms, colon  Ordered cologuard  Type 2 diabetes mellitus (HCC)  Chronic, well controlled. Continue current regimen. Follow up 6 months for a1c  HLD (hyperlipidemia) Refilled statin    Return in about 6 months (around 10/21/2019) for DM.   Lucila Maine, DO Family Medicine Resident PGY-3

## 2019-04-21 NOTE — Patient Instructions (Addendum)
   Good to see you today!  You're due for an eye exam- please get this done at your convenience  Due for mammogram and dexa scan as well. You can get this done at the breast center without an order from your doctor.  I ordered cologuard for you to screen for colon cancer.  If you have questions or concerns please do not hesitate to call at [phone].  Lucila Maine, DO PGY-3, [facility] 04/21/2019 9:33 AM

## 2019-04-21 NOTE — Assessment & Plan Note (Signed)
Refilled statin

## 2019-04-21 NOTE — Assessment & Plan Note (Signed)
  Chronic, well controlled. Continue current regimen. Follow up 6 months for a1c

## 2019-04-26 NOTE — Progress Notes (Signed)
Confirmed w/ lab director that Cologuard has been ordered for this patient and should be mailed to her within the week.

## 2019-04-28 ENCOUNTER — Other Ambulatory Visit: Payer: Self-pay | Admitting: Family Medicine

## 2019-04-28 MED FILL — metFORMIN HCL 1000 MG TABS: 1000 | 90 days supply | Qty: 180 | Fill #0

## 2019-05-05 MED FILL — NURTEC 75 MG TBDP: 75 | 30 days supply | Qty: 8 | Fill #0

## 2019-05-08 MED FILL — ESZOPICLONE 2 MG TAB: 2 | 30 days supply | Qty: 30 | Fill #1

## 2019-05-08 MED FILL — tiZANidine HCL 4 MG TABS: 4 | 20 days supply | Qty: 60 | Fill #0

## 2019-05-09 ENCOUNTER — Other Ambulatory Visit: Payer: Self-pay | Admitting: *Deleted

## 2019-05-09 MED ORDER — ALBUTEROL SULFATE HFA 108 (90 BASE) MCG/ACT IN AERS: 2.0000 | INHALATION_SPRAY | RESPIRATORY_TRACT | 1 refills | Status: DC | PRN

## 2019-05-09 NOTE — Telephone Encounter (Signed)
Husband called and said that patient needs a refill of her albuterol.  Will forward to MD. Johnney Ou

## 2019-05-10 MED FILL — ALBUTEROL SULFATE HFA 108 (: 108 (90 BAS | 17 days supply | Qty: 18 | Fill #0

## 2019-05-10 MED FILL — clonazePAM 1 MG TABS: 1 | 30 days supply | Qty: 60 | Fill #0

## 2019-05-11 MED FILL — RIZATRIPTAN BENZOATE 10 MG: 10 | 30 days supply | Qty: 8 | Fill #1

## 2019-05-11 MED FILL — MONTELUKAST SOD 10 MG TAB: 10 | 30 days supply | Qty: 30 | Fill #0

## 2019-05-12 MED FILL — BACLOFEN 10 MG TABS: 10 | 30 days supply | Qty: 60 | Fill #0

## 2019-05-15 MED FILL — EMGALITY 120 MG/ML SOAJ: 120 | 30 days supply | Qty: 1 | Fill #1

## 2019-05-22 DIAGNOSIS — F429 Obsessive-compulsive disorder, unspecified: Secondary | ICD-10-CM | POA: Diagnosis not present

## 2019-05-22 DIAGNOSIS — F411 Generalized anxiety disorder: Secondary | ICD-10-CM | POA: Diagnosis not present

## 2019-05-22 DIAGNOSIS — F3161 Bipolar disorder, current episode mixed, mild: Secondary | ICD-10-CM | POA: Diagnosis not present

## 2019-05-22 MED FILL — QUETIAPINE FUMARATE 400 MG: 400 | 90 days supply | Qty: 90 | Fill #0

## 2019-05-23 MED FILL — GABAPENTIN 300 MG CAPSULE: 300 | 30 days supply | Qty: 120 | Fill #1

## 2019-05-25 MED FILL — tiZANidine HCL 4 MG TABS: 4 | 20 days supply | Qty: 60 | Fill #0

## 2019-05-30 MED FILL — DULoxetine HCL 60 MG CPEP: 60 | 30 days supply | Qty: 60 | Fill #1

## 2019-06-01 ENCOUNTER — Other Ambulatory Visit: Payer: Self-pay

## 2019-06-01 ENCOUNTER — Telehealth (INDEPENDENT_AMBULATORY_CARE_PROVIDER_SITE_OTHER): Payer: 59 | Admitting: Family Medicine

## 2019-06-01 DIAGNOSIS — K5900 Constipation, unspecified: Secondary | ICD-10-CM | POA: Diagnosis not present

## 2019-06-01 MED ORDER — LINACLOTIDE 72 MCG PO CAPS: 72.0000 ug | ORAL_CAPSULE | Freq: Every day | ORAL | 2 refills | Status: DC

## 2019-06-01 MED FILL — LINZESS 72 MCG CAPSULE: 72 | 30 days supply | Qty: 30 | Fill #0

## 2019-06-01 NOTE — Progress Notes (Signed)
150lb bp- n/a t- no fever  [place], [place] - [address] gave consent to telephone visit. Salvatore Marvel, CMA

## 2019-06-01 NOTE — Progress Notes (Signed)
[facility] Telemedicine Visit  Patient consented to have virtual visit. Method of visit: Telephone  Encounter participants: Patient: Kelly Shannon - located at home Provider: Matilde Haymaker - located at [facility] Others (if applicable): none  Chief Complaint: Wants to discuss Linzess  HPI: Constipation Ms. Tipping has been taking Linzess 290 mcg daily for constipation.  Lately, she has been having trouble with loose bowel movements.  She reports that she takes her Linzess in the morning and typically by noon she has a prolonged loose bowel movement.  She reports that she stays on the commode for about 30 minutes uncomfortable watery bowel movement.  She reports no blood in her feces, she denies stomach pain and fevers.    She wanted to ensure that it would be safe to decrease her Linzess.  She is previously taking MiraLAX for constipation found to be ineffective.  She reports that she take MiraLAX once in the morning without significant resolution of her constipation.  She reports that she never did attempt to up titrate her MiraLAX.  She has never taken any additional medication for constipation.   ROS: per HPI  Pertinent PMHx: Constipation  Exam:  Respiratory: Breathing comfortably.  No issues completing long sentences.  No obvious wheezing.  Assessment/Plan:  Constipation This appears to be a side effect of excess amount of Linzess for chronic constipation.  Low concern for infection or GI pathology at this time.  Discussed decreasing Linzess versus discontinuing Linzess in favor of MiraLAX and senna.  We will simply decrease Linzess at this time. -Decrease Linzess to 72 mcg daily, patient can take 2 capsules if 1 is ineffective -Consider up titration of MiraLAX before increasing Linzess again to 290 mcg.  She is not currently taking any MiraLAX.    Time spent during visit with patient: 15 minutes

## 2019-06-01 NOTE — Assessment & Plan Note (Signed)
This appears to be a side effect of excess amount of Linzess for chronic constipation.  Low concern for infection or GI pathology at this time.  Discussed decreasing Linzess versus discontinuing Linzess in favor of MiraLAX and senna.  We will simply decrease Linzess at this time. -Decrease Linzess to 72 mcg daily, patient can take 2 capsules if 1 is ineffective -Consider up titration of MiraLAX before increasing Linzess again to 290 mcg.  She is not currently taking any MiraLAX.

## 2019-06-07 MED FILL — ESZOPICLONE 2 MG TAB: 2 | 30 days supply | Qty: 30 | Fill #0

## 2019-06-08 MED FILL — BACLOFEN 10 MG TABS: 10 | 30 days supply | Qty: 60 | Fill #1

## 2019-06-08 MED FILL — clonazePAM 1 MG TABS: 1 | 30 days supply | Qty: 60 | Fill #1

## 2019-06-12 ENCOUNTER — Other Ambulatory Visit: Payer: Self-pay | Admitting: *Deleted

## 2019-06-14 MED ORDER — MONTELUKAST SODIUM 10 MG PO TABS: 10.0000 mg | ORAL_TABLET | Freq: Every day | ORAL | 5 refills | Status: DC

## 2019-06-14 MED FILL — MONTELUKAST SOD 10 MG TAB: 10 | 30 days supply | Qty: 30 | Fill #0

## 2019-06-19 MED FILL — EMGALITY 120 MG/ML SOAJ: 120 | 30 days supply | Qty: 1 | Fill #2

## 2019-06-19 MED FILL — NURTEC 75 MG TBDP: 75 | 30 days supply | Qty: 8 | Fill #1

## 2019-06-26 MED FILL — GABAPENTIN 300 MG CAPSULE: 300 | 30 days supply | Qty: 120 | Fill #2

## 2019-06-26 MED FILL — tiZANidine HCL 4 MG TABS: 4 | 20 days supply | Qty: 60 | Fill #0

## 2019-06-27 MED FILL — LINZESS 72 MCG CAPSULE: 72 | 30 days supply | Qty: 30 | Fill #1

## 2019-06-29 MED FILL — DULoxetine HCL 60 MG CPEP: 60 | 30 days supply | Qty: 60 | Fill #2

## 2019-07-05 MED FILL — TOPIRAMATE 100 MG TABLET: 100 | 90 days supply | Qty: 270 | Fill #1

## 2019-07-06 MED FILL — clonazePAM 1 MG TABS: 1 | 30 days supply | Qty: 60 | Fill #2

## 2019-07-06 MED FILL — ESZOPICLONE 2 MG TAB: 2 | 30 days supply | Qty: 30 | Fill #1

## 2019-07-06 MED FILL — BACLOFEN 10 MG TABS: 10 | 30 days supply | Qty: 60 | Fill #2

## 2019-07-12 MED FILL — metFORMIN HCL 1000 MG TABS: 1000 | 90 days supply | Qty: 180 | Fill #1

## 2019-07-12 MED FILL — MONTELUKAST SOD 10 MG TAB: 10 | 30 days supply | Qty: 30 | Fill #1

## 2019-07-21 MED FILL — EMGALITY 120 MG/ML SOAJ: 120 | 28 days supply | Qty: 1 | Fill #0

## 2019-07-24 MED FILL — ATORVASTATIN 40 MG TABLET: 40 | 90 days supply | Qty: 90 | Fill #1

## 2019-07-24 MED FILL — LINZESS 72 MCG CAPSULE: 72 | 30 days supply | Qty: 30 | Fill #2

## 2019-07-26 MED FILL — tiZANidine HCL 4 MG TABS: 4 | 20 days supply | Qty: 60 | Fill #0

## 2019-07-27 DIAGNOSIS — M5441 Lumbago with sciatica, right side: Secondary | ICD-10-CM | POA: Diagnosis not present

## 2019-07-27 DIAGNOSIS — M797 Fibromyalgia: Secondary | ICD-10-CM | POA: Diagnosis not present

## 2019-07-27 DIAGNOSIS — M5442 Lumbago with sciatica, left side: Secondary | ICD-10-CM | POA: Diagnosis not present

## 2019-07-27 DIAGNOSIS — G43009 Migraine without aura, not intractable, without status migrainosus: Secondary | ICD-10-CM | POA: Diagnosis not present

## 2019-07-27 DIAGNOSIS — M542 Cervicalgia: Secondary | ICD-10-CM | POA: Diagnosis not present

## 2019-07-27 MED FILL — DULoxetine HCL 60 MG CPEP: 60 | 30 days supply | Qty: 60 | Fill #0

## 2019-07-27 MED FILL — predniSONE 5 MG TABS: 5 | 6 days supply | Qty: 21 | Fill #0

## 2019-07-27 MED FILL — GABAPENTIN 400 MG CAPSULE: 400 | 30 days supply | Qty: 120 | Fill #0

## 2019-07-27 MED FILL — NURTEC 75 MG TBDP: 75 | 30 days supply | Qty: 8 | Fill #0

## 2019-08-03 MED FILL — ESZOPICLONE 2 MG TAB: 2 | 30 days supply | Qty: 30 | Fill #2

## 2019-08-09 MED FILL — BACLOFEN 10 MG TABS: 10 | 30 days supply | Qty: 60 | Fill #0

## 2019-08-11 MED FILL — clonazePAM 1 MG TABS: 1 | 5 days supply | Qty: 10 | Fill #0

## 2019-08-14 MED FILL — MONTELUKAST SOD 10 MG TAB: 10 | 30 days supply | Qty: 30 | Fill #2

## 2019-08-14 MED FILL — JARDIANCE 10 MG TABLET: 10 | 90 days supply | Qty: 90 | Fill #1

## 2019-08-16 DIAGNOSIS — G4700 Insomnia, unspecified: Secondary | ICD-10-CM | POA: Diagnosis not present

## 2019-08-16 DIAGNOSIS — F319 Bipolar disorder, unspecified: Secondary | ICD-10-CM | POA: Diagnosis not present

## 2019-08-16 DIAGNOSIS — F411 Generalized anxiety disorder: Secondary | ICD-10-CM | POA: Diagnosis not present

## 2019-08-16 MED FILL — clonazePAM 1 MG TABS: 1 | 30 days supply | Qty: 60 | Fill #0

## 2019-08-16 MED FILL — QUETIAPINE FUMARATE 400 MG: 400 | 90 days supply | Qty: 90 | Fill #1

## 2019-08-16 MED FILL — EMGALITY 120 MG/ML SOAJ: 120 | 28 days supply | Qty: 1 | Fill #1

## 2019-08-16 MED FILL — FLUoxetine HCL 20 MG CAPS: 20 | 90 days supply | Qty: 90 | Fill #0

## 2019-08-22 ENCOUNTER — Other Ambulatory Visit: Payer: Self-pay

## 2019-08-22 ENCOUNTER — Telehealth (INDEPENDENT_AMBULATORY_CARE_PROVIDER_SITE_OTHER): Payer: 59 | Admitting: Family Medicine

## 2019-08-22 DIAGNOSIS — B9789 Other viral agents as the cause of diseases classified elsewhere: Secondary | ICD-10-CM | POA: Diagnosis not present

## 2019-08-22 DIAGNOSIS — J329 Chronic sinusitis, unspecified: Secondary | ICD-10-CM | POA: Diagnosis not present

## 2019-08-22 NOTE — Progress Notes (Signed)
[facility] Telemedicine Visit  Patient consented to have virtual visit. Method of visit: Telephone  Encounter participants: Patient: Kelly Shannon - located at home Provider: Matilde Haymaker - located at [facility] Others (if applicable): none  Chief Complaint: Sinus  HPI: Sinusitis Kelly Shannon reports that she has been having viral URI symptoms for the past 2 to 3 days.  She reports significant sinus congestion and green nasal mucus in the past several days.  She notes that she had a elevated temperature up to 99.9 and has been experiencing increased sinus pressure over her cheeks.  She also reports a sore throat and occasional mild shortness of breath while lying down.  She has had sinus infections in the past and this feels similar to previous presentations.  She denies any known exposure to Covid positive patients.  Denies chest pain  ROS: per HPI  Pertinent PMHx: Seasonal allergies  Exam:  Respiratory: Breathing comfortably on room air.  Able to complete long sentences without effort.  Assessment/Plan:  Viral sinusitis Based on duration and symptoms, this is most likely a viral sinusitis.  She was informed that, at this time, it is most appropriate to manage her symptoms with Tylenol, fluids and keeping her comfortable.  If her symptoms persist longer than 10 to 14 days, would be appropriate to consider antibiotics however, at this time antibiotics would not be helpful.  She was advised to be assessed if she had worsened shortness of breath, new symptoms or worsening symptoms.  She acknowledged understanding.  Time spent during visit with patient: 15 minutes

## 2019-08-28 ENCOUNTER — Other Ambulatory Visit: Payer: Self-pay | Admitting: Family Medicine

## 2019-08-28 DIAGNOSIS — K5900 Constipation, unspecified: Secondary | ICD-10-CM

## 2019-08-28 MED FILL — GABAPENTIN 400 MG CAPSULE: 400 | 30 days supply | Qty: 120 | Fill #1

## 2019-08-29 MED FILL — LINZESS 72 MCG CAPSULE: 72 | 30 days supply | Qty: 30 | Fill #0

## 2019-09-04 MED FILL — tiZANidine HCL 4 MG TABS: 4 | 20 days supply | Qty: 60 | Fill #0

## 2019-09-07 DIAGNOSIS — H52223 Regular astigmatism, bilateral: Secondary | ICD-10-CM | POA: Diagnosis not present

## 2019-09-07 DIAGNOSIS — H5202 Hypermetropia, left eye: Secondary | ICD-10-CM | POA: Diagnosis not present

## 2019-09-07 DIAGNOSIS — H524 Presbyopia: Secondary | ICD-10-CM | POA: Diagnosis not present

## 2019-09-07 DIAGNOSIS — H31011 Macula scars of posterior pole (postinflammatory) (post-traumatic), right eye: Secondary | ICD-10-CM | POA: Diagnosis not present

## 2019-09-07 MED FILL — BACLOFEN 10 MG TABS: 10 | 30 days supply | Qty: 60 | Fill #1

## 2019-09-07 MED FILL — FLUAD QUADRIVALENT 0.5 ML P: 0.5 | 1 days supply | Qty: 1 | Fill #0

## 2019-09-13 MED FILL — MONTELUKAST SOD 10 MG TAB: 10 | 30 days supply | Qty: 30 | Fill #3

## 2019-09-13 MED FILL — ESZOPICLONE 2 MG TAB: 2 | 30 days supply | Qty: 30 | Fill #0

## 2019-09-15 DIAGNOSIS — H18413 Arcus senilis, bilateral: Secondary | ICD-10-CM | POA: Diagnosis not present

## 2019-09-15 DIAGNOSIS — H25043 Posterior subcapsular polar age-related cataract, bilateral: Secondary | ICD-10-CM | POA: Diagnosis not present

## 2019-09-15 DIAGNOSIS — H2512 Age-related nuclear cataract, left eye: Secondary | ICD-10-CM | POA: Diagnosis not present

## 2019-09-15 DIAGNOSIS — H25013 Cortical age-related cataract, bilateral: Secondary | ICD-10-CM | POA: Diagnosis not present

## 2019-09-15 DIAGNOSIS — H2513 Age-related nuclear cataract, bilateral: Secondary | ICD-10-CM | POA: Diagnosis not present

## 2019-09-18 MED FILL — EMGALITY 120 MG/ML SOAJ: 120 | 28 days supply | Qty: 1 | Fill #2

## 2019-09-18 MED FILL — clonazePAM 1 MG TABS: 1 | 30 days supply | Qty: 60 | Fill #0

## 2019-09-22 MED FILL — BESIVANCE 0.6% SUSP: 0.6 | 25 days supply | Qty: 5 | Fill #0

## 2019-09-22 MED FILL — DUREZOL 0.05% EYE DROPS: 0.05 | 25 days supply | Qty: 5 | Fill #0

## 2019-09-22 MED FILL — PROLENSA 0.07% EYE DROPS: 0.07 | 60 days supply | Qty: 3 | Fill #0

## 2019-10-02 DIAGNOSIS — H2512 Age-related nuclear cataract, left eye: Secondary | ICD-10-CM | POA: Diagnosis not present

## 2019-10-02 MED FILL — GABAPENTIN 400 MG CAPSULE: 400 | 30 days supply | Qty: 120 | Fill #2

## 2019-10-03 DIAGNOSIS — H2511 Age-related nuclear cataract, right eye: Secondary | ICD-10-CM | POA: Diagnosis not present

## 2019-10-10 MED FILL — BACLOFEN 10 MG TABS: 10 | 30 days supply | Qty: 60 | Fill #2

## 2019-10-11 MED FILL — MONTELUKAST SOD 10 MG TAB: 10 | 30 days supply | Qty: 30 | Fill #4

## 2019-10-12 MED FILL — DUREZOL 0.05% EYE DROPS: 0.05 | 25 days supply | Qty: 5 | Fill #0

## 2019-10-12 MED FILL — BESIVANCE 0.6% SUSP: 0.6 | 25 days supply | Qty: 5 | Fill #0

## 2019-10-13 MED FILL — NURTEC 75 MG TBDP: 75 | 30 days supply | Qty: 8 | Fill #1

## 2019-10-16 DIAGNOSIS — H2511 Age-related nuclear cataract, right eye: Secondary | ICD-10-CM | POA: Diagnosis not present

## 2019-10-16 MED FILL — PROLENSA 0.07% EYE DROPS: 0.07 | 30 days supply | Qty: 3 | Fill #0

## 2019-10-16 MED FILL — ATORVASTATIN 40 MG TABLET: 40 | 90 days supply | Qty: 90 | Fill #2

## 2019-10-16 MED FILL — EMGALITY 120 MG/ML SOAJ: 120 | 28 days supply | Qty: 1 | Fill #3

## 2019-10-20 MED FILL — clonazePAM 1 MG TABS: 1 | 30 days supply | Qty: 60 | Fill #0

## 2019-10-24 MED FILL — ESZOPICLONE 2 MG TAB: 2 | 30 days supply | Qty: 30 | Fill #1

## 2019-10-30 MED FILL — LINZESS 72 MCG CAPSULE: 72 | 30 days supply | Qty: 30 | Fill #2

## 2019-10-31 MED FILL — tiZANidine HCL 4 MG TABS: 4 | 20 days supply | Qty: 60 | Fill #0

## 2019-11-06 MED FILL — GABAPENTIN 400 MG CAPSULE: 400 | 30 days supply | Qty: 120 | Fill #3

## 2019-11-07 MED FILL — BACLOFEN 10 MG TABS: 10 | 30 days supply | Qty: 60 | Fill #0

## 2019-11-08 MED FILL — QUETIAPINE FUMARATE 400 MG: 400 | 30 days supply | Qty: 30 | Fill #0

## 2019-11-09 DIAGNOSIS — M797 Fibromyalgia: Secondary | ICD-10-CM | POA: Diagnosis not present

## 2019-11-09 DIAGNOSIS — M5442 Lumbago with sciatica, left side: Secondary | ICD-10-CM | POA: Diagnosis not present

## 2019-11-09 DIAGNOSIS — M542 Cervicalgia: Secondary | ICD-10-CM | POA: Diagnosis not present

## 2019-11-09 DIAGNOSIS — M5441 Lumbago with sciatica, right side: Secondary | ICD-10-CM | POA: Diagnosis not present

## 2019-11-09 DIAGNOSIS — G43009 Migraine without aura, not intractable, without status migrainosus: Secondary | ICD-10-CM | POA: Diagnosis not present

## 2019-11-09 DIAGNOSIS — M5416 Radiculopathy, lumbar region: Secondary | ICD-10-CM | POA: Diagnosis not present

## 2019-11-09 MED FILL — RIZATRIPTAN BENZOATE 10 MG: 10 | 30 days supply | Qty: 8 | Fill #0

## 2019-11-09 MED FILL — PROMETHAZINE 25 MG TABLET: 25 | 10 days supply | Qty: 30 | Fill #0

## 2019-11-10 DIAGNOSIS — M47896 Other spondylosis, lumbar region: Secondary | ICD-10-CM | POA: Diagnosis not present

## 2019-11-10 DIAGNOSIS — S46011A Strain of muscle(s) and tendon(s) of the rotator cuff of right shoulder, initial encounter: Secondary | ICD-10-CM | POA: Diagnosis not present

## 2019-11-10 DIAGNOSIS — M542 Cervicalgia: Secondary | ICD-10-CM | POA: Diagnosis not present

## 2019-11-10 DIAGNOSIS — M47897 Other spondylosis, lumbosacral region: Secondary | ICD-10-CM | POA: Diagnosis not present

## 2019-11-13 DIAGNOSIS — F319 Bipolar disorder, unspecified: Secondary | ICD-10-CM | POA: Diagnosis not present

## 2019-11-13 DIAGNOSIS — M47896 Other spondylosis, lumbar region: Secondary | ICD-10-CM | POA: Diagnosis not present

## 2019-11-13 DIAGNOSIS — F411 Generalized anxiety disorder: Secondary | ICD-10-CM | POA: Diagnosis not present

## 2019-11-13 DIAGNOSIS — S46011A Strain of muscle(s) and tendon(s) of the rotator cuff of right shoulder, initial encounter: Secondary | ICD-10-CM | POA: Diagnosis not present

## 2019-11-13 DIAGNOSIS — M542 Cervicalgia: Secondary | ICD-10-CM | POA: Diagnosis not present

## 2019-11-13 DIAGNOSIS — M47897 Other spondylosis, lumbosacral region: Secondary | ICD-10-CM | POA: Diagnosis not present

## 2019-11-13 DIAGNOSIS — G4700 Insomnia, unspecified: Secondary | ICD-10-CM | POA: Diagnosis not present

## 2019-11-13 MED FILL — MONTELUKAST SOD 10 MG TAB: 10 | 30 days supply | Qty: 30 | Fill #5

## 2019-11-13 MED FILL — FLUoxetine HCL 20 MG CAPS: 20 | 90 days supply | Qty: 90 | Fill #0

## 2019-11-16 DIAGNOSIS — M47897 Other spondylosis, lumbosacral region: Secondary | ICD-10-CM | POA: Diagnosis not present

## 2019-11-16 DIAGNOSIS — M47896 Other spondylosis, lumbar region: Secondary | ICD-10-CM | POA: Diagnosis not present

## 2019-11-16 DIAGNOSIS — M542 Cervicalgia: Secondary | ICD-10-CM | POA: Diagnosis not present

## 2019-11-16 DIAGNOSIS — S46011A Strain of muscle(s) and tendon(s) of the rotator cuff of right shoulder, initial encounter: Secondary | ICD-10-CM | POA: Diagnosis not present

## 2019-11-17 MED FILL — EMGALITY 120 MG/ML SOAJ: 120 | 28 days supply | Qty: 1 | Fill #0

## 2019-11-20 DIAGNOSIS — M47897 Other spondylosis, lumbosacral region: Secondary | ICD-10-CM | POA: Diagnosis not present

## 2019-11-20 DIAGNOSIS — M542 Cervicalgia: Secondary | ICD-10-CM | POA: Diagnosis not present

## 2019-11-20 DIAGNOSIS — S46011A Strain of muscle(s) and tendon(s) of the rotator cuff of right shoulder, initial encounter: Secondary | ICD-10-CM | POA: Diagnosis not present

## 2019-11-20 DIAGNOSIS — M47896 Other spondylosis, lumbar region: Secondary | ICD-10-CM | POA: Diagnosis not present

## 2019-11-20 MED FILL — clonazePAM 1 MG TABS: 1 | 30 days supply | Qty: 60 | Fill #0

## 2019-11-26 NOTE — Progress Notes (Signed)
[facility] Telemedicine Visit  Patient consented to have virtual visit. Method of visit: Video was attempted, but technology challenges prevented patient from using video, so visit was conducted via telephone.  Encounter participants: Patient: Kelly Shannon - located at Home Provider: Dollene Cleveland - located at [facility] Others (if applicable): None.  Chief Complaint: Sinus Infection  HPI: Patient has been irrigating her nose and getting "green gook" out, blowing nose produces green mucus. Pressure under her eyes and forehead. Feels "yucky". Started feeling bad last Wednesday (Jan 20th). Was taking Pseudoephedrine, was minimally helpful. Taking tylenol, too. She had a low grade fever at 99.8*F, productive cough, sore throat, nasal drainage,  Denies chills, body aches, chest pain, shortness of breath, nausea, vomiting, or rashes.  ROS: per HPI  Pertinent PMHx: T2DM,   Exam:  Respiratory: Speaking in full sentences  Assessment/Plan: Viral sinusitis -Flonase to each nostril once daily -Neti Pott 2 ytimes daily to flush nose -1 tbsp Honey 3 times day, warm tea and soup broths -Albuterol inhaler as needed (patient using BID) -Keep taking Singulair and Claritin 10mg  daily -Check in again virtually on Friday, Jan 29th at 2:10pm.   Time spent during visit with patient: 10:00 minutes  Jan 31, DO [facility], PGY-2 11/27/2019 9:37 AM

## 2019-11-27 ENCOUNTER — Telehealth (INDEPENDENT_AMBULATORY_CARE_PROVIDER_SITE_OTHER): Payer: 59 | Admitting: Family Medicine

## 2019-11-27 ENCOUNTER — Other Ambulatory Visit: Payer: Self-pay

## 2019-11-27 DIAGNOSIS — B9789 Other viral agents as the cause of diseases classified elsewhere: Secondary | ICD-10-CM

## 2019-11-27 DIAGNOSIS — J329 Chronic sinusitis, unspecified: Secondary | ICD-10-CM

## 2019-11-27 MED FILL — ESZOPICLONE 2 MG TAB: 2 | 30 days supply | Qty: 30 | Fill #2

## 2019-11-27 NOTE — Assessment & Plan Note (Signed)
-  Flonase to each nostril once daily -Neti Pott 2 ytimes daily to flush nose -1 tbsp Honey 3 times day, warm tea and soup broths -Albuterol inhaler as needed (patient using BID) -Keep taking Singulair and Claritin 10mg  daily -Check in again virtually on Friday, Jan 29th at 2:10pm.

## 2019-11-30 ENCOUNTER — Other Ambulatory Visit: Payer: Self-pay | Admitting: Family Medicine

## 2019-11-30 DIAGNOSIS — K5900 Constipation, unspecified: Secondary | ICD-10-CM

## 2019-11-30 MED FILL — LINZESS 72 MCG CAPSULE: 72 | 30 days supply | Qty: 30 | Fill #0

## 2019-11-30 MED FILL — DULoxetine HCL 60 MG CPEP: 60 | 30 days supply | Qty: 60 | Fill #1

## 2019-12-01 ENCOUNTER — Telehealth (INDEPENDENT_AMBULATORY_CARE_PROVIDER_SITE_OTHER): Payer: 59 | Admitting: Family Medicine

## 2019-12-01 ENCOUNTER — Other Ambulatory Visit: Payer: Self-pay

## 2019-12-01 DIAGNOSIS — J329 Chronic sinusitis, unspecified: Secondary | ICD-10-CM | POA: Diagnosis not present

## 2019-12-01 DIAGNOSIS — M47897 Other spondylosis, lumbosacral region: Secondary | ICD-10-CM | POA: Diagnosis not present

## 2019-12-01 DIAGNOSIS — S46011A Strain of muscle(s) and tendon(s) of the rotator cuff of right shoulder, initial encounter: Secondary | ICD-10-CM | POA: Diagnosis not present

## 2019-12-01 DIAGNOSIS — M47896 Other spondylosis, lumbar region: Secondary | ICD-10-CM | POA: Diagnosis not present

## 2019-12-01 DIAGNOSIS — B9689 Other specified bacterial agents as the cause of diseases classified elsewhere: Secondary | ICD-10-CM

## 2019-12-01 DIAGNOSIS — M542 Cervicalgia: Secondary | ICD-10-CM | POA: Diagnosis not present

## 2019-12-01 MED ORDER — DOXYCYCLINE HYCLATE 100 MG PO TABS: 100.0000 mg | ORAL_TABLET | Freq: Two times a day (BID) | ORAL | 0 refills | Status: AC

## 2019-12-01 MED FILL — DOXYCYCLINE HYCLATE 100 MG: 100 | 7 days supply | Qty: 14 | Fill #0

## 2019-12-01 NOTE — Assessment & Plan Note (Signed)
Patient on day 10 of sinusitis. Conservative measures have failed to make patient's symptoms better. Believe illness has now taken on bacterial source. - Start Doxycycline 100mg  BID x 7 days - Patient to continue conservative measures: tylenol PRN, neti pott, Flonase, hydration, honey for cough, etc.

## 2019-12-01 NOTE — Progress Notes (Signed)
[facility] Telemedicine Visit  Patient consented to have virtual visit. Method of visit: Telephone  Encounter participants: Patient: Kelly Shannon - located at home [phone]) Provider: Dollene Cleveland - located at [facility] Others (if applicable): None  Chief Complaint: follow up sinusitis  HPI: Patient had telephone visit Monday Jan 25th with concerns for runny nose, productive cough, sinus pressure, headache, and low grade fever. At the time she was on day 6 of illness and instructed to continue conservative measures and to check in with [place] again Jan 29th if no better.  Today she reports she is "going crazy", states her sinuses are no better and have even gotten worse. She has been using the conservative measures (Neti pott, Flonase, drinking warm liquids, Tylenol, etc) which she states only temporarily relieve her symptoms.  Today she endorses HA across forehead and under eyes, states her teeth are hurting, she is sneezing, and is coughing up green stuff. She denies shortness of breath, body aches, and chills.  ROS: per HPI  Pertinent PMHx: T2DM, HTN, HLD, CKD Allergies: Sulfa drugs, Penicillin  Exam:  Respiratory: speaking on complete sentences  Assessment/Plan: Bacterial sinusitis Patient on day 10 of sinusitis. Conservative measures have failed to make patient's symptoms better. Believe illness has now taken on bacterial source. - Start Doxycycline 100mg  BID x 7 days - Patient to continue conservative measures: tylenol PRN, neti pott, Flonase, hydration, honey for cough, etc.    Time spent during visit with patient: 7 minutes   , DO [facility], PGY-2 12/01/2019 1:45 PM

## 2019-12-06 MED FILL — GABAPENTIN 400 MG CAPSULE: 400 | 30 days supply | Qty: 120 | Fill #0

## 2019-12-06 MED FILL — QUETIAPINE FUMARATE 400 MG: 400 | 30 days supply | Qty: 30 | Fill #0

## 2019-12-06 MED FILL — BACLOFEN 10 MG TABS: 10 | 30 days supply | Qty: 60 | Fill #1

## 2019-12-13 ENCOUNTER — Other Ambulatory Visit: Payer: Self-pay | Admitting: Family Medicine

## 2019-12-13 MED FILL — MONTELUKAST SOD 10 MG TAB: 10 | 30 days supply | Qty: 30 | Fill #0

## 2019-12-14 DIAGNOSIS — M47896 Other spondylosis, lumbar region: Secondary | ICD-10-CM | POA: Diagnosis not present

## 2019-12-14 DIAGNOSIS — M542 Cervicalgia: Secondary | ICD-10-CM | POA: Diagnosis not present

## 2019-12-14 DIAGNOSIS — S46011A Strain of muscle(s) and tendon(s) of the rotator cuff of right shoulder, initial encounter: Secondary | ICD-10-CM | POA: Diagnosis not present

## 2019-12-14 DIAGNOSIS — M47897 Other spondylosis, lumbosacral region: Secondary | ICD-10-CM | POA: Diagnosis not present

## 2019-12-15 MED FILL — tiZANidine HCL 4 MG TABS: 4 | 20 days supply | Qty: 60 | Fill #0

## 2019-12-22 MED FILL — EMGALITY 120 MG/ML SOAJ: 120 | 28 days supply | Qty: 1 | Fill #1

## 2019-12-22 MED FILL — clonazePAM 1 MG TABS: 1 | 30 days supply | Qty: 60 | Fill #0

## 2019-12-29 MED FILL — LINZESS 72 MCG CAPSULE: 72 | 30 days supply | Qty: 30 | Fill #1

## 2020-01-01 MED FILL — ESZOPICLONE 2 MG TAB: 2 | 30 days supply | Qty: 30 | Fill #0

## 2020-01-04 MED FILL — BACLOFEN 10 MG TABS: 10 | 30 days supply | Qty: 60 | Fill #2

## 2020-01-04 MED FILL — GABAPENTIN 400 MG CAPSULE: 400 | 30 days supply | Qty: 120 | Fill #1

## 2020-01-10 ENCOUNTER — Encounter: Payer: Self-pay | Admitting: Orthopaedic Surgery

## 2020-01-10 ENCOUNTER — Other Ambulatory Visit: Payer: Self-pay

## 2020-01-10 ENCOUNTER — Ambulatory Visit (INDEPENDENT_AMBULATORY_CARE_PROVIDER_SITE_OTHER): Payer: 59 | Admitting: Orthopaedic Surgery

## 2020-01-10 ENCOUNTER — Ambulatory Visit (INDEPENDENT_AMBULATORY_CARE_PROVIDER_SITE_OTHER): Payer: 59

## 2020-01-10 DIAGNOSIS — M25511 Pain in right shoulder: Secondary | ICD-10-CM | POA: Diagnosis not present

## 2020-01-10 DIAGNOSIS — Z96611 Presence of right artificial shoulder joint: Secondary | ICD-10-CM

## 2020-01-10 MED ORDER — METHYLPREDNISOLONE ACETATE 40 MG/ML IJ SUSP
40.0000 mg | INTRAMUSCULAR | Status: AC | PRN
  Administered 2020-01-10: 40 mg via INTRA_ARTICULAR

## 2020-01-10 MED ORDER — LIDOCAINE HCL 1 % IJ SOLN
3.0000 mL | INTRAMUSCULAR | Status: AC | PRN
  Administered 2020-01-10: 3 mL

## 2020-01-10 NOTE — Progress Notes (Signed)
Office Visit Note   Patient: Kelly Shannon           Date of Birth: 07/10/1951           MRN: [id number] Visit Date: 01/10/2020              Requested by: Matilde Haymaker, MD [address] [place],  [place] [address] PCP: Matilde Haymaker, MD   Assessment & Plan: Visit Diagnoses:  1. Right shoulder pain, unspecified chronicity     Plan: Given the clinical exam and x-ray findings of her right shoulder, I am concerned that the rotator cuff is torn again.  Her arthroscopic intervention was a decade ago.  Based on her clinical exam and x-ray findings I do feel an MRI is warranted to assess the rotator cuff at this point and assess the glenohumeral joint to help [place] determine what her best treatment options will be.  I did place a steroid injection in the subacromial outlet to help with her acute pain.  We will see her back after the MRI of her shoulder.  She agrees with this treatment plan.  She did tolerate the steroid injection well.  Follow-Up Instructions: Return in about 2 weeks (around 01/24/2020).   Orders:  Orders Placed This Encounter  Procedures  . Large Joint Inj: R subacromial bursa  . XR Shoulder Right   No orders of the defined types were placed in this encounter.     Procedures: Large Joint Inj: R subacromial bursa on 01/10/2020 3:25 PM Indications: pain and diagnostic evaluation Details: 22 G 1.5 in needle  Arthrogram: No  Medications: 3 mL lidocaine 1 %; 40 mg methylPREDNISolone acetate 40 MG/ML Outcome: tolerated well, no immediate complications Procedure, treatment alternatives, risks and benefits explained, specific risks discussed. Consent was given by the patient. Immediately prior to procedure a time out was called to verify the correct patient, procedure, equipment, support staff and site/side marked as required. Patient was prepped and draped in the usual sterile fashion.       Clinical Data: No additional findings.   Subjective: Chief Complaint    Patient presents with  . Right Shoulder - Pain  The patient is someone I have not seen in a long period of time.  About 10 years ago we did perform a right shoulder arthroscopy with a rotator cuff repair.  She said she has been doing well until out 2 months ago she started developing worsening shoulder pain.  She is 69 years old.  She does report weakness and decreased motion of her right shoulder as well.  She denies neck pain.  She is a diabetic but reports good diabetic control.  HPI  Review of Systems She currently denies any headache, chest pain, shortness of breath, fever, chills, nausea, vomiting  Objective: Vital Signs: There were no vitals taken for this visit.  Physical Exam She is alert and orient x3 and in no acute distress Ortho Exam Examination of the right shoulder shows significant weakness of the rotator cuff.  This is with abduction and external rotation.  There is grinding at the glenohumeral joint.  The humeral head seems to be high riding.  She is using more of her deltoids to abduct her shoulder. Specialty Comments:  No specialty comments available.  Imaging: XR Shoulder Right  Result Date: 01/10/2020 3 views of the right shoulder show that the humeral head is high riding.  Surprisingly there is not a lot of arthritic changes on plain films of the glenohumeral  joint.    PMFS History: Patient Active Problem List   Diagnosis Date Noted  . Bacterial sinusitis 12/01/2019  . Special screening for malignant neoplasms, colon 09/23/2018  . Mild persistent asthma 04/21/2016  . Viral sinusitis 02/18/2016  . 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 Chronic pain associated with significant psychosocial dysfunction 12/19/2015  . 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 Diabetes mellitus (HCC) 12/19/2015  . Fibromyalgia 12/19/2015  . HLD (hyperlipidemia) 12/19/2015  . Cannot sleep 12/19/2015  . Lumbar radiculopathy 12/19/2015  . Polypharmacy  11/29/2015  . Impaired renal function 11/29/2015  . Chronic ethmoidal sinusitis 10/01/2015  . Antritis chronic 09/18/2015  . 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 Cardiac murmur 03/08/2012  . Chronic neck pain 02/10/2012  . Chronic elbow pain 02/10/2012  . Bilateral chronic knee pain 02/10/2012  . Chronic hand pain 02/10/2012  . Chronic ankle pain 02/10/2012  . Chronic pain 02/10/2012  . Allergic rhinitis 09/22/2011  . Bipolar affective disorder (HCC) 09/22/2011  . Hot flash, menopausal 09/22/2011  . Menopausal and perimenopausal disorder 09/15/2011  . Bipolar affective disorder, depressed, severe, with psychotic behavior (HCC) 08/11/2011  . Type 2 diabetes mellitus (HCC) 08/11/2011  . Hypercholesterolemia 08/11/2011  . Constipation 02/16/2010  . Disturbance in sleep behavior 06/16/2009   Past Medical History:  Diagnosis Date  . Asthma   . Diabetes mellitus   . Hyperlipidemia   . Hypertension     Family History  Problem Relation Age of Onset  . Diabetes Mother   . Heart disease Mother   . Heart disease Father     Past Surgical History:  Procedure Laterality Date  . ABDOMINAL HYSTERECTOMY     Social History   Occupational History  . Not on file  Tobacco Use  . Smoking status: Never Smoker  . Smokeless tobacco: Never Used  Substance and Sexual Activity  . Alcohol use: No  . Drug use: No  . Sexual activity: Yes

## 2020-01-12 MED FILL — QUETIAPINE FUMARATE 400 MG: 400 | 90 days supply | Qty: 90 | Fill #0

## 2020-01-15 MED FILL — metFORMIN HCL 1000 MG TABS: 1000 | 90 days supply | Qty: 180 | Fill #2

## 2020-01-15 MED FILL — DULoxetine HCL 60 MG CPEP: 60 | 30 days supply | Qty: 60 | Fill #2

## 2020-01-15 MED FILL — MONTELUKAST SOD 10 MG TAB: 10 | 30 days supply | Qty: 30 | Fill #1

## 2020-01-15 MED FILL — ATORVASTATIN 40 MG TABLET: 40 | 90 days supply | Qty: 90 | Fill #3

## 2020-01-17 MED FILL — tiZANidine HCL 4 MG TABS: 4 | 20 days supply | Qty: 60 | Fill #0

## 2020-01-17 MED FILL — TOPIRAMATE 100 MG TABLET: 100 | 90 days supply | Qty: 270 | Fill #0

## 2020-01-18 MED FILL — EMGALITY 120 MG/ML SOAJ: 120 | 28 days supply | Qty: 1 | Fill #2

## 2020-01-23 MED FILL — clonazePAM 1 MG TABS: 1 | 30 days supply | Qty: 60 | Fill #0

## 2020-01-24 ENCOUNTER — Ambulatory Visit: Payer: 59 | Admitting: Orthopaedic Surgery

## 2020-01-29 MED FILL — LINZESS 72 MCG CAPSULE: 72 | 30 days supply | Qty: 30 | Fill #2

## 2020-01-30 ENCOUNTER — Encounter: Payer: Self-pay | Admitting: Family Medicine

## 2020-01-30 ENCOUNTER — Other Ambulatory Visit: Payer: Self-pay

## 2020-01-30 ENCOUNTER — Telehealth (INDEPENDENT_AMBULATORY_CARE_PROVIDER_SITE_OTHER): Payer: 59 | Admitting: Family Medicine

## 2020-01-30 DIAGNOSIS — J309 Allergic rhinitis, unspecified: Secondary | ICD-10-CM

## 2020-01-30 DIAGNOSIS — J302 Other seasonal allergic rhinitis: Secondary | ICD-10-CM

## 2020-01-30 NOTE — Progress Notes (Signed)
[facility] Telemedicine Visit  Patient consented to have virtual visit. Method of visit: Video  Encounter participants: Patient: Kelly Shannon - located at home Provider: Marthenia Rolling - located at [facility] clinic   Chief Complaint: Allergic rhinitis  HPI:  Runny nose and sinus pressure since Wednesday, no fever (~100), nasal congestion, worse at night.  Some sinus pressure, some post nasal drip.  Minor rhinorhea with occasional red streaks if she blows but no epistaxis.  Has seen an allergist and told she has multiple environmental allergies that she does not know the name of, has seen an allergist and received a series of shots.  She says she was told by them that she was discharged and that they have done all of the can  ROS: per HPI  Pertinent PMHx: Allergies to "everything "  Exam:  Respiratory: No respiratory distress, speaking full sentences on the video, no cough  Assessment/Plan:  Allergic rhinitis Seasonal rhinitis, consistent with prior years.  Patient is already been evaluated by an allergist, no respiratory distress, no fever, no systemic symptoms.  Symptoms only for 6 days, does not meet criteria for antibiotics discussed emergency precautions with patient and will continue to use over-the-counter medication.    Time spent during visit with patient: 10 minutes

## 2020-02-01 NOTE — Assessment & Plan Note (Signed)
Seasonal rhinitis, consistent with prior years.  Patient is already been evaluated by an allergist, no respiratory distress, no fever, no systemic symptoms.  Symptoms only for 6 days, does not meet criteria for antibiotics discussed emergency precautions with patient and will continue to use over-the-counter medication.

## 2020-02-05 MED FILL — GABAPENTIN 400 MG CAPSULE: 400 | 30 days supply | Qty: 120 | Fill #2

## 2020-02-05 MED FILL — ESZOPICLONE 2 MG TAB: 2 | 30 days supply | Qty: 30 | Fill #1

## 2020-02-07 ENCOUNTER — Other Ambulatory Visit (HOSPITAL_COMMUNITY): Payer: Self-pay | Admitting: Nurse Practitioner

## 2020-02-07 DIAGNOSIS — F429 Obsessive-compulsive disorder, unspecified: Secondary | ICD-10-CM | POA: Diagnosis not present

## 2020-02-07 DIAGNOSIS — Z79899 Other long term (current) drug therapy: Secondary | ICD-10-CM | POA: Diagnosis not present

## 2020-02-07 DIAGNOSIS — F419 Anxiety disorder, unspecified: Secondary | ICD-10-CM | POA: Diagnosis not present

## 2020-02-07 DIAGNOSIS — F319 Bipolar disorder, unspecified: Secondary | ICD-10-CM | POA: Diagnosis not present

## 2020-02-07 MED FILL — FLUoxetine HCL 20 MG CAPS: 20 | 90 days supply | Qty: 90 | Fill #0

## 2020-02-08 MED FILL — DULoxetine HCL 60 MG CPEP: 60 | 30 days supply | Qty: 60 | Fill #0

## 2020-02-09 MED FILL — BACLOFEN 10 MG TABS: 10 | 30 days supply | Qty: 60 | Fill #0

## 2020-02-15 MED FILL — MONTELUKAST SOD 10 MG TAB: 10 | 30 days supply | Qty: 30 | Fill #2

## 2020-02-19 ENCOUNTER — Inpatient Hospital Stay: Admission: RE | Admit: 2020-02-19 | Payer: 59 | Source: Ambulatory Visit

## 2020-02-20 ENCOUNTER — Other Ambulatory Visit: Payer: Self-pay | Admitting: Family Medicine

## 2020-02-20 ENCOUNTER — Other Ambulatory Visit: Payer: Self-pay

## 2020-02-20 ENCOUNTER — Ambulatory Visit (INDEPENDENT_AMBULATORY_CARE_PROVIDER_SITE_OTHER): Payer: 59 | Admitting: Family Medicine

## 2020-02-20 ENCOUNTER — Telehealth: Payer: Self-pay

## 2020-02-20 VITALS — BP 126/62 | HR 83 | Ht 64.0 in | Wt 148.2 lb

## 2020-02-20 DIAGNOSIS — R1084 Generalized abdominal pain: Secondary | ICD-10-CM | POA: Insufficient documentation

## 2020-02-20 DIAGNOSIS — Z1211 Encounter for screening for malignant neoplasm of colon: Secondary | ICD-10-CM | POA: Diagnosis not present

## 2020-02-20 LAB — POCT URINALYSIS DIP (MANUAL ENTRY)
Bilirubin, UA: NEGATIVE
Blood, UA: NEGATIVE
Glucose, UA: 250 mg/dL — AB
Ketones, POC UA: NEGATIVE mg/dL
Leukocytes, UA: NEGATIVE
Nitrite, UA: NEGATIVE
Protein Ur, POC: NEGATIVE mg/dL
Spec Grav, UA: 1.015 (ref 1.010–1.025)
Urobilinogen, UA: 0.2 E.U./dL
pH, UA: 7 (ref 5.0–8.0)

## 2020-02-20 MED ORDER — DICYCLOMINE HCL 10 MG PO CAPS: 10.0000 mg | ORAL_CAPSULE | Freq: Three times a day (TID) | ORAL | 0 refills | Status: DC | PRN

## 2020-02-20 MED FILL — DICYCLOMINE 10 MG CAPSULE: 10 | 15 days supply | Qty: 45 | Fill #0

## 2020-02-20 NOTE — Assessment & Plan Note (Signed)
Has never had a colonoscopy, ordered.  Patient given colonoscopy form.

## 2020-02-20 NOTE — Assessment & Plan Note (Addendum)
Doubt infectious given that patient has not had fever or other infectious symptoms.  Appendicitis would be less likely given negative McBurney's and lack of fever, also with ability to tolerate diet.  Could consider gallbladder pathology, could also consider kidney stone, UA obtained with glucose only, patient on SGLT2 inhibitor.  Right upper quadrant ultrasound ordered to rule out gallbladder pathology.  Also obtain CBC, CMP, lipase given that her abdominal exam shows generalized tenderness.  She has not had a colonoscopy, therefore will also refer for this.  Prescribed Bentyl for spasms to use 3 times daily as needed.

## 2020-02-20 NOTE — Patient Instructions (Signed)
Thank you for coming to see me today. It was a pleasure. Today we talked about:   Your abdominal pain: We have scheduled you for an ultrasound of the right upper quadrant of your abdomen.  This will assess for any gallbladder changes.  We will get some labs today.  We will also check your urine.  I have also prescribed a medication called Bentyl to the pharmacy for you, you can take this up to 3 times a day as needed, I recommend taking this before your meals.  Please call one of the stomach doctors listed on the colonoscopy paper to schedule a colonoscopy.  If you have any worsening in your symptoms, you are unable to tolerate anything by mouth, you should be seen right away.  Please follow-up with PCP in 1 to 2 weeks if no improvement.  If you have any questions or concerns, please do not hesitate to call the office at [phone].  Best,   Luis Abed, DO

## 2020-02-20 NOTE — Telephone Encounter (Signed)
Patient calls nurse line requesting sooner appointment. Patient scheduled for next Monday, 4/26 with PCP, however, patient states that she does not believe she can wait that long to be seen. Patient reports history of IBS and believes this is flaring up again.   Scheduled same day appointment with Dr. Obie Dredge.   To PCP and Dr. Fleeta Emmer, RN

## 2020-02-20 NOTE — Progress Notes (Signed)
    SUBJECTIVE:   CHIEF COMPLAINT / HPI:   Abdominal Pain RUQ and RLQ 4-5 weeks, constant pain that has progressively worsened sine it started She did not sleep last night because it was hurting Has a history of IBS about 20 years ago and states it feels the same, but doesn't remember what she took at that time Just describes the pain like a bruise that you push on  9/10 pain at worst and at present Takes Linzess for constipation, bowel movements have been regular No nausea, vomiting Had a BM last night that was a little soft No blood in stool or melena No fevers Some foods make the pain worse, like nuts, green beans, fatty foods No radiation of pain into back or shoulder History of hysterectomy, no other abdominal surgeries Has been able to drink fluids without problem Moving around makes the pain worse Has been taking some ibuprofen usually takes two tablets at night Has never had a colonoscopy   PERTINENT  PMH / PSH: Bipolar disorder, T2DM, asthma, chronic constipation  OBJECTIVE:   BP 126/62   Pulse 83   Ht 5\' 4"  (1.626 m)   Wt 148 lb 3.2 oz (67.2 kg)   SpO2 99%   BMI 25.44 kg/m    Physical Exam:  General: 68 y.o. female in NAD Cardio: RRR Lungs: CTAB, no wheezing, no rhonchi, no crackles, no IWOB on RA Abdomen: Soft, diffusely tender to palpation, worst in right upper quadrant and right lower quadrant, mildly distended, positive bowel sounds, equivocal Murphy's, negative McBurney's Skin: warm and dry Extremities: No edema  Results for orders placed or performed in visit on 02/20/20 (from the past 24 hour(s))  POCT urinalysis dipstick     Status: Abnormal   Collection Time: 02/20/20 11:45 AM  Result Value Ref Range   Color, UA yellow yellow   Clarity, UA clear clear   Glucose, UA =250 (A) negative mg/dL   Bilirubin, UA negative negative   Ketones, POC UA negative negative mg/dL   Spec Grav, UA 02/22/20 2.694 - 1.025   Blood, UA negative negative   pH, UA 7.0  5.0 - 8.0   Protein Ur, POC negative negative mg/dL   Urobilinogen, UA 0.2 0.2 or 1.0 E.U./dL   Nitrite, UA Negative Negative   Leukocytes, UA Negative Negative     ASSESSMENT/PLAN:   Generalized abdominal pain Doubt infectious given that patient has not had fever or other infectious symptoms.  Appendicitis would be less likely given negative McBurney's and lack of fever, also with ability to tolerate diet.  Could consider gallbladder pathology, could also consider kidney stone, UA obtained with glucose only, patient on SGLT2 inhibitor.  Right upper quadrant ultrasound ordered to rule out gallbladder pathology.  Also obtain CBC, CMP, lipase given that her abdominal exam shows generalized tenderness.  She has not had a colonoscopy, therefore will also refer for this.  Prescribed Bentyl for spasms to use 3 times daily as needed.  Colon cancer screening Has never had a colonoscopy, ordered.  Patient given colonoscopy form.     8.546, DO [facility]

## 2020-02-21 ENCOUNTER — Ambulatory Visit
Admission: RE | Admit: 2020-02-21 | Discharge: 2020-02-21 | Disposition: A | Discharge status: Home / Self Care | Payer: 59 | Source: Ambulatory Visit | Attending: Family Medicine | Admitting: Family Medicine

## 2020-02-21 ENCOUNTER — Ambulatory Visit: Payer: 59 | Admitting: Orthopaedic Surgery

## 2020-02-21 DIAGNOSIS — R1084 Generalized abdominal pain: Secondary | ICD-10-CM

## 2020-02-21 DIAGNOSIS — R109 Unspecified abdominal pain: Secondary | ICD-10-CM | POA: Diagnosis not present

## 2020-02-21 LAB — COMPREHENSIVE METABOLIC PANEL
ALT: 7 IU/L (ref 0–32)
AST: 7 IU/L (ref 0–40)
Albumin/Globulin Ratio: 1.6 (ref 1.2–2.2)
Albumin: 4 g/dL (ref 3.8–4.8)
Alkaline Phosphatase: 146 IU/L — ABNORMAL HIGH (ref 39–117)
BUN/Creatinine Ratio: 23 (ref 12–28)
BUN: 18 mg/dL (ref 8–27)
Bilirubin Total: <0.2 mg/dL (ref 0.0–1.2)
CO2: 21 mmol/L (ref 20–29)
Calcium: 9.3 mg/dL (ref 8.7–10.3)
Chloride: 106 mmol/L (ref 96–106)
Creatinine, Ser: 0.77 mg/dL (ref 0.57–1.00)
GFR calc Af Amer: 93 mL/min/{1.73_m2} (ref >59)
GFR calc non Af Amer: 81 mL/min/{1.73_m2} (ref >59)
Globulin, Total: 2.5 g/dL (ref 1.5–4.5)
Glucose: 166 mg/dL — ABNORMAL HIGH (ref 65–99)
Potassium: 4 mmol/L (ref 3.5–5.2)
Sodium: 143 mmol/L (ref 134–144)
Total Protein: 6.5 g/dL (ref 6.0–8.5)

## 2020-02-21 LAB — CBC
Hematocrit: 32.5 % — ABNORMAL LOW (ref 34.0–46.6)
Hemoglobin: 10.6 g/dL — ABNORMAL LOW (ref 11.1–15.9)
MCH: 29.3 pg (ref 26.6–33.0)
MCHC: 32.6 g/dL (ref 31.5–35.7)
MCV: 90 fL (ref 79–97)
Platelets: 389 10*3/uL (ref 150–450)
RBC: 3.62 x10E6/uL — ABNORMAL LOW (ref 3.77–5.28)
RDW: 13.1 % (ref 11.7–15.4)
WBC: 8.8 10*3/uL (ref 3.4–10.8)

## 2020-02-21 LAB — LIPASE: Lipase: 106 U/L — ABNORMAL HIGH (ref 14–72)

## 2020-02-22 ENCOUNTER — Telehealth: Payer: Self-pay

## 2020-02-22 NOTE — Telephone Encounter (Signed)
Patient was called and updated, see result notes

## 2020-02-22 NOTE — Telephone Encounter (Signed)
Patient calls nurse line requesting recent ultrasound results. Please advise. Will forward to provider who ordered the test.

## 2020-02-23 MED FILL — clonazePAM 1 MG TABS: 1 | 30 days supply | Qty: 60 | Fill #0

## 2020-02-23 MED FILL — EMGALITY 120 MG/ML SOAJ: 120 | 28 days supply | Qty: 1 | Fill #3

## 2020-02-26 ENCOUNTER — Ambulatory Visit (INDEPENDENT_AMBULATORY_CARE_PROVIDER_SITE_OTHER): Payer: 59 | Admitting: Family Medicine

## 2020-02-26 ENCOUNTER — Encounter: Payer: Self-pay | Admitting: Family Medicine

## 2020-02-26 ENCOUNTER — Other Ambulatory Visit: Payer: Self-pay

## 2020-02-26 VITALS — BP 128/78 | HR 72 | Ht 64.0 in | Wt 149.0 lb

## 2020-02-26 DIAGNOSIS — I10 Essential (primary) hypertension: Secondary | ICD-10-CM | POA: Diagnosis not present

## 2020-02-26 DIAGNOSIS — E785 Hyperlipidemia, unspecified: Secondary | ICD-10-CM | POA: Diagnosis not present

## 2020-02-26 DIAGNOSIS — D649 Anemia, unspecified: Secondary | ICD-10-CM

## 2020-02-26 DIAGNOSIS — R1084 Generalized abdominal pain: Secondary | ICD-10-CM

## 2020-02-26 DIAGNOSIS — F315 Bipolar disorder, current episode depressed, severe, with psychotic features: Secondary | ICD-10-CM | POA: Diagnosis not present

## 2020-02-26 DIAGNOSIS — Z1211 Encounter for screening for malignant neoplasm of colon: Secondary | ICD-10-CM | POA: Diagnosis not present

## 2020-02-26 DIAGNOSIS — R748 Abnormal levels of other serum enzymes: Secondary | ICD-10-CM | POA: Diagnosis not present

## 2020-02-26 DIAGNOSIS — E119 Type 2 diabetes mellitus without complications: Secondary | ICD-10-CM

## 2020-02-26 LAB — POCT GLYCOSYLATED HEMOGLOBIN (HGB A1C): HbA1c, POC (controlled diabetic range): 6.3 % (ref 0.0–7.0)

## 2020-02-26 MED ORDER — DICYCLOMINE HCL 10 MG PO CAPS: 10.0000 mg | ORAL_CAPSULE | Freq: Three times a day (TID) | ORAL | 0 refills | Status: DC | PRN

## 2020-02-26 MED FILL — NURTEC 75 MG TBDP: 75 | 30 days supply | Qty: 8 | Fill #2

## 2020-02-26 NOTE — Assessment & Plan Note (Signed)
-  Follow-up lipid panel -Continue atorvastatin 40 mg

## 2020-02-26 NOTE — Assessment & Plan Note (Signed)
She appears to be on SNRI monotherapy for her bipolar disorder.  She was encouraged to follow-up with her psychiatrist to ensure this is the desired regimen for this problem.

## 2020-02-26 NOTE — Assessment & Plan Note (Signed)
Well-controlled today. -Continue losartan 25 mg -Follow-up renal function

## 2020-02-26 NOTE — Assessment & Plan Note (Addendum)
Last CBC shows a normocytic anemia with a hemoglobin of 10.6.  She denies any vaginal or rectal bleeding today. -Follow-up iron -Ambulatory referral placed for colonoscopy

## 2020-02-26 NOTE — Progress Notes (Signed)
    SUBJECTIVE:   CHIEF COMPLAINT / HPI: Follow-up regarding recent abdominal pain.  Abdominal pain She was seen in clinic 6 days ago for abdominal pain.  She was given Bentyl and had a renal ultrasound performed.  Bentyl seems to have significantly improved her symptoms.  The ultrasound demonstrated findings that required follow-up imaging.  She is not currently endorsing any abdominal pain.  Diabetes Her medications include Jardiance 10 mg daily and Metformin 1000 mg twice daily.  She has not noted any side effects to these medications.  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  She is currently being treated with Cymbalta and Klonopin.  She gets both of these medications from her psychiatrist.  It appears that she was previously taking these medications in combination with Latuda but is only currently taking Klonopin and duloxetine.  Hypertension She does not notice any lightheadedness or dizziness when moving from a seated to standing position.  Hyperlipidemia Current medications include atorvastatin 40 mg.  Irritable bowel syndrome She currently takes Linzess daily and has been taking Bentyl nightly.  She took the Bentyl 3 times daily until her symptoms subsided and then continue taking it at night prophylactically.  PERTINENT  PMH / PSH: See HPI  OBJECTIVE:   BP 128/78   Pulse 72   Ht _0  (1.626 m)   Wt 149 lb (67.6 kg)   SpO2 100%   BMI 25.58 kg/m    General: Alert and cooperative and appears to be in no acute distress HEENT: Neck non-tender without lymphadenopathy, masses or thyromegaly Cardio: Normal S1 and S2, no S3 or S4. Rhythm is regular. Pulm: Clear to auscultation bilaterally, no crackles, wheezing, or diminished breath sounds. Normal respiratory effort Abdomen: Bowel sounds normal. Abdomen soft and non-tender.  Extremities: No peripheral edema. Warm/ well perfused.  Strong radial pulse. Neuro: Cranial nerves  grossly intact   ASSESSMENT/PLAN:   Type 2 diabetes mellitus ([place]) Well-controlled.  A1c 6.3 today. -Continue empagliflozin -Continue Metformin -Follow-up in 3-6 months  Bipolar affective disorder, depressed, severe, with psychotic behavior ([place]) She appears to be on SNRI monotherapy for her bipolar disorder.  She was encouraged to follow-up with her psychiatrist to ensure this is the desired regimen for this problem.  Generalized abdominal pain Her stomach pain seems to have entirely resolved with Bentyl.  She has not yet had her abdominal CT performed because she recently returned from a vacation.  Testing from her recent lab visit showed an elevated alk phos and mild anemia. -Bentyl refilled -Follow-up abdominal CT. -Follow-up alk phos, GGT  HLD (hyperlipidemia) -Follow-up lipid panel -Continue atorvastatin 40 mg  Hypertension Well-controlled today. -Continue losartan 25 mg -Follow-up renal function  Anemia Last CBC shows a normocytic anemia with a hemoglobin of 10.6.  She denies any vaginal or rectal bleeding today. -Follow-up iron -Ambulatory referral placed for colonoscopy     Matilde Haymaker, MD [place]

## 2020-02-26 NOTE — Assessment & Plan Note (Addendum)
Her stomach pain seems to have entirely resolved with Bentyl.  She has not yet had her abdominal CT performed because she recently returned from a vacation.  Testing from her recent lab visit showed an elevated alk phos and mild anemia. -Bentyl refilled -Follow-up abdominal CT. -Follow-up alk phos, GGT

## 2020-02-26 NOTE — Assessment & Plan Note (Signed)
Well-controlled.  A1c 6.3 today. -Continue empagliflozin -Continue Metformin -Follow-up in 3-6 months

## 2020-02-26 NOTE — Patient Instructions (Signed)
Colon cancer screening: I have put in a referral for you to have a colonoscopy done.  This is routine screening based on your age.  You should get a call to set up an appointment in the next 1-2 weeks.  Bipolar disorder: We talked to her psychiatrist, I recommend that you make sure your medication should only include duloxetine (Cymbalta) and should not include another medication like Lunesta.  IBS: I have refilled your Bentyl.  I recommend only using this as needed.  Diabetes: Your diabetes appears to be very well controlled.  Continue your current medications.  Please also call your eye doctor and ask if they can have a diabetic eye exam done.  This is just part of routine diabetes care.  I will let you know if any of your labs are abnormal and if we need to do any more labs.

## 2020-02-27 LAB — LIPID PANEL
Chol/HDL Ratio: 2.3 ratio (ref 0.0–4.4)
Cholesterol, Total: 122 mg/dL (ref 100–199)
HDL: 54 mg/dL (ref >39)
LDL Chol Calc (NIH): 50 mg/dL (ref 0–99)
Triglycerides: 98 mg/dL (ref 0–149)
VLDL Cholesterol Cal: 18 mg/dL (ref 5–40)

## 2020-02-27 LAB — COMPREHENSIVE METABOLIC PANEL
ALT: 15 IU/L (ref 0–32)
AST: 19 IU/L (ref 0–40)
Albumin/Globulin Ratio: 2.1 (ref 1.2–2.2)
Albumin: 4.1 g/dL (ref 3.8–4.8)
Alkaline Phosphatase: 153 IU/L — ABNORMAL HIGH (ref 39–117)
BUN/Creatinine Ratio: 16 (ref 12–28)
BUN: 11 mg/dL (ref 8–27)
Bilirubin Total: <0.2 mg/dL (ref 0.0–1.2)
CO2: 25 mmol/L (ref 20–29)
Calcium: 8.7 mg/dL (ref 8.7–10.3)
Chloride: 97 mmol/L (ref 96–106)
Creatinine, Ser: 0.68 mg/dL (ref 0.57–1.00)
GFR calc Af Amer: 105 mL/min/{1.73_m2} (ref >59)
GFR calc non Af Amer: 91 mL/min/{1.73_m2} (ref >59)
Globulin, Total: 2 g/dL (ref 1.5–4.5)
Glucose: 177 mg/dL — ABNORMAL HIGH (ref 65–99)
Potassium: 4 mmol/L (ref 3.5–5.2)
Sodium: 135 mmol/L (ref 134–144)
Total Protein: 6.1 g/dL (ref 6.0–8.5)

## 2020-02-27 LAB — IRON: Iron: 52 ug/dL (ref 27–139)

## 2020-02-27 LAB — GAMMA GT: GGT: 29 IU/L (ref 0–60)

## 2020-02-28 ENCOUNTER — Encounter: Payer: Self-pay | Admitting: Family Medicine

## 2020-02-28 NOTE — Addendum Note (Signed)
Addended by: Nada Libman on: 02/28/2020 03:19 PM   Modules accepted: Orders

## 2020-02-29 ENCOUNTER — Telehealth: Payer: Self-pay

## 2020-02-29 ENCOUNTER — Other Ambulatory Visit: Payer: Self-pay | Admitting: Family Medicine

## 2020-02-29 LAB — FERRITIN: Ferritin: 21 ng/mL (ref 15–150)

## 2020-02-29 MED ORDER — FERROUS SULFATE 325 (65 FE) MG PO TABS: 325.0000 mg | ORAL_TABLET | Freq: Every day | ORAL | 3 refills | Status: DC

## 2020-02-29 NOTE — Telephone Encounter (Signed)
Patient calls nurse line stating she is returning phone call from Dr. Homero Fellers.   Please return call to patient at [phone]

## 2020-03-04 MED FILL — DICYCLOMINE 10 MG CAPSULE: 10 | 15 days supply | Qty: 45 | Fill #0

## 2020-03-05 ENCOUNTER — Other Ambulatory Visit: Payer: Self-pay | Admitting: Family Medicine

## 2020-03-05 DIAGNOSIS — K5900 Constipation, unspecified: Secondary | ICD-10-CM

## 2020-03-05 MED FILL — GABAPENTIN 400 MG CAPSULE: 400 | 30 days supply | Qty: 120 | Fill #3

## 2020-03-05 MED FILL — ESZOPICLONE 2 MG TAB: 2 | 30 days supply | Qty: 30 | Fill #2

## 2020-03-06 MED FILL — LINZESS 72 MCG CAPSULE: 72 | 90 days supply | Qty: 90 | Fill #0

## 2020-03-18 MED FILL — DULoxetine HCL 60 MG CPEP: 60 | 30 days supply | Qty: 60 | Fill #1

## 2020-03-19 MED FILL — MONTELUKAST SOD 10 MG TAB: 10 | 30 days supply | Qty: 30 | Fill #3

## 2020-03-20 MED FILL — EMGALITY 120 MG/ML SOAJ: 120 | 28 days supply | Qty: 1 | Fill #4

## 2020-03-25 MED FILL — clonazePAM 1 MG TABS: 1 | 30 days supply | Qty: 60 | Fill #0

## 2020-04-04 ENCOUNTER — Other Ambulatory Visit: Payer: Self-pay | Admitting: Family Medicine

## 2020-04-04 DIAGNOSIS — R1084 Generalized abdominal pain: Secondary | ICD-10-CM

## 2020-04-04 MED FILL — DICYCLOMINE 10 MG CAPSULE: 10 | 15 days supply | Qty: 45 | Fill #0

## 2020-04-05 MED FILL — GABAPENTIN 400 MG CAPSULE: 400 | 30 days supply | Qty: 120 | Fill #0

## 2020-04-05 MED FILL — ESZOPICLONE 2 MG TAB: 2 | 30 days supply | Qty: 30 | Fill #0

## 2020-04-08 ENCOUNTER — Encounter: Payer: Self-pay | Admitting: Family Medicine

## 2020-04-15 ENCOUNTER — Other Ambulatory Visit: Payer: Self-pay | Admitting: *Deleted

## 2020-04-15 ENCOUNTER — Other Ambulatory Visit: Payer: Self-pay | Admitting: Family Medicine

## 2020-04-15 MED ORDER — EMPAGLIFLOZIN 10 MG PO TABS: 10.0000 mg | ORAL_TABLET | Freq: Every day | ORAL | 1 refills | Status: DC

## 2020-04-15 MED FILL — JARDIANCE 10 MG TABLET: 10 | 90 days supply | Qty: 90 | Fill #0

## 2020-04-17 DIAGNOSIS — G43009 Migraine without aura, not intractable, without status migrainosus: Secondary | ICD-10-CM | POA: Diagnosis not present

## 2020-04-17 DIAGNOSIS — M545 Low back pain: Secondary | ICD-10-CM | POA: Diagnosis not present

## 2020-04-17 MED FILL — tiZANidine HCL 4 MG TABS: 4 | 20 days supply | Qty: 60 | Fill #0

## 2020-04-18 ENCOUNTER — Other Ambulatory Visit: Payer: Self-pay | Admitting: Family Medicine

## 2020-04-18 MED FILL — EMGALITY 120 MG/ML SOAJ: 120 | 28 days supply | Qty: 1 | Fill #5

## 2020-04-18 MED FILL — ATORVASTATIN 40 MG TABLET: 40 | 90 days supply | Qty: 90 | Fill #0

## 2020-04-22 ENCOUNTER — Other Ambulatory Visit: Payer: Self-pay | Admitting: Family Medicine

## 2020-04-22 DIAGNOSIS — R1084 Generalized abdominal pain: Secondary | ICD-10-CM

## 2020-04-23 MED FILL — MONTELUKAST SOD 10 MG TAB: 10 | 30 days supply | Qty: 30 | Fill #4

## 2020-04-23 MED FILL — DULoxetine HCL 60 MG CPEP: 60 | 30 days supply | Qty: 60 | Fill #2

## 2020-04-25 MED FILL — clonazePAM 1 MG TABS: 1 | 30 days supply | Qty: 60 | Fill #0

## 2020-04-29 ENCOUNTER — Other Ambulatory Visit: Payer: Self-pay | Admitting: Family Medicine

## 2020-04-29 DIAGNOSIS — R1084 Generalized abdominal pain: Secondary | ICD-10-CM

## 2020-04-29 MED FILL — ESZOPICLONE 2 MG TAB: 2 | 30 days supply | Qty: 30 | Fill #1

## 2020-04-30 MED FILL — DICYCLOMINE 10 MG CAPSULE: 10 | 15 days supply | Qty: 45 | Fill #0

## 2020-05-03 MED FILL — GABAPENTIN 400 MG CAPSULE: 400 | 30 days supply | Qty: 120 | Fill #1

## 2020-05-03 MED FILL — NURTEC 75 MG TBDP: 75 | 30 days supply | Qty: 8 | Fill #3

## 2020-05-14 MED FILL — BACLOFEN 10 MG TABS: 10 | 30 days supply | Qty: 60 | Fill #0

## 2020-05-15 ENCOUNTER — Ambulatory Visit: Payer: 59 | Admitting: Physician Assistant

## 2020-05-21 MED FILL — RIZATRIPTAN BENZOATE 10 MG: 10 | 30 days supply | Qty: 8 | Fill #1

## 2020-05-21 MED FILL — tiZANidine HCL 4 MG TABS: 4 | 20 days supply | Qty: 60 | Fill #1

## 2020-05-23 DIAGNOSIS — Z79899 Other long term (current) drug therapy: Secondary | ICD-10-CM | POA: Diagnosis not present

## 2020-05-23 DIAGNOSIS — F3160 Bipolar disorder, current episode mixed, unspecified: Secondary | ICD-10-CM | POA: Diagnosis not present

## 2020-05-23 DIAGNOSIS — F429 Obsessive-compulsive disorder, unspecified: Secondary | ICD-10-CM | POA: Diagnosis not present

## 2020-05-23 DIAGNOSIS — F419 Anxiety disorder, unspecified: Secondary | ICD-10-CM | POA: Diagnosis not present

## 2020-05-23 MED FILL — FLUoxetine HCL 20 MG CAPS: 20 | 90 days supply | Qty: 90 | Fill #0

## 2020-05-24 MED FILL — clonazePAM 1 MG TABS: 1 | 30 days supply | Qty: 60 | Fill #0

## 2020-05-24 MED FILL — MONTELUKAST SOD 10 MG TAB: 10 | 30 days supply | Qty: 30 | Fill #5

## 2020-05-24 MED FILL — EMGALITY 120 MG/ML SOAJ: 120 | 30 days supply | Qty: 1 | Fill #0

## 2020-05-28 MED FILL — ESZOPICLONE 2 MG TAB: 2 | 30 days supply | Qty: 30 | Fill #2

## 2020-05-29 ENCOUNTER — Telehealth: Payer: Self-pay

## 2020-05-29 NOTE — Telephone Encounter (Signed)
Patient calls nurse line requesting higher dosage of Linzess. Patient reports that she has been having issues with constipation since starting iron supplementation.   Forwarding to PCP  Veronda Prude, RN

## 2020-05-29 NOTE — Telephone Encounter (Signed)
Advise taking the iron every other day if constipation is an issue.  If she is already doing QOD dosing, start taking one capful of miralax daily. This is a common problem and I think it can be treated without changing the linzess.  Mirian Mo, MD

## 2020-05-30 NOTE — Telephone Encounter (Signed)
Called patient and informed of below.  ? ?Kelina Beauchamp C Feather Berrie, RN ? ?

## 2020-06-03 MED FILL — DULoxetine HCL 60 MG CPEP: 60 | 30 days supply | Qty: 60 | Fill #3

## 2020-06-10 ENCOUNTER — Other Ambulatory Visit: Payer: Self-pay | Admitting: Family Medicine

## 2020-06-10 DIAGNOSIS — K5900 Constipation, unspecified: Secondary | ICD-10-CM

## 2020-06-10 DIAGNOSIS — R1084 Generalized abdominal pain: Secondary | ICD-10-CM

## 2020-06-10 MED FILL — DICYCLOMINE 10 MG CAPSULE: 10 | 15 days supply | Qty: 45 | Fill #0

## 2020-06-10 MED FILL — GABAPENTIN 400 MG CAPSULE: 400 | 30 days supply | Qty: 120 | Fill #2

## 2020-06-10 MED FILL — LINZESS 72 MCG CAPSULE: 72 | 90 days supply | Qty: 90 | Fill #0

## 2020-06-14 ENCOUNTER — Other Ambulatory Visit (HOSPITAL_COMMUNITY): Payer: Self-pay | Admitting: Neurology

## 2020-06-14 MED FILL — BACLOFEN 10 MG TABS: 10 | 30 days supply | Qty: 60 | Fill #0

## 2020-06-19 MED FILL — EMGALITY 120 MG/ML SOAJ: 120 | 30 days supply | Qty: 1 | Fill #1

## 2020-06-20 MED FILL — tiZANidine HCL 4 MG TABS: 4 | 20 days supply | Qty: 60 | Fill #2

## 2020-06-24 ENCOUNTER — Other Ambulatory Visit: Payer: Self-pay | Admitting: Family Medicine

## 2020-06-25 ENCOUNTER — Other Ambulatory Visit: Payer: Self-pay | Admitting: Family Medicine

## 2020-06-25 MED FILL — ALBUTEROL SULFATE HFA 108 (: 108 (90 BAS | 17 days supply | Qty: 18 | Fill #0

## 2020-06-25 MED FILL — clonazePAM 1 MG TABS: 1 | 30 days supply | Qty: 60 | Fill #0

## 2020-06-25 MED FILL — MONTELUKAST SOD 10 MG TAB: 10 | 30 days supply | Qty: 30 | Fill #0

## 2020-06-25 MED FILL — ESZOPICLONE 2 MG TAB: 2 | 30 days supply | Qty: 30 | Fill #0

## 2020-06-27 ENCOUNTER — Other Ambulatory Visit: Payer: Self-pay | Admitting: Family Medicine

## 2020-06-27 DIAGNOSIS — R1084 Generalized abdominal pain: Secondary | ICD-10-CM

## 2020-06-27 MED FILL — DICYCLOMINE 10 MG CAPSULE: 10 | 15 days supply | Qty: 45 | Fill #0

## 2020-06-28 ENCOUNTER — Other Ambulatory Visit: Payer: Self-pay | Admitting: Family Medicine

## 2020-06-28 MED FILL — FUROSEMIDE 40 MG TAB: 40 | 90 days supply | Qty: 90 | Fill #0

## 2020-07-04 MED FILL — GABAPENTIN 400 MG CAPSULE: 400 | 30 days supply | Qty: 120 | Fill #3

## 2020-07-10 MED FILL — DULoxetine HCL 60 MG CPEP: 60 | 30 days supply | Qty: 60 | Fill #4

## 2020-07-12 ENCOUNTER — Other Ambulatory Visit: Payer: Self-pay | Admitting: Family Medicine

## 2020-07-12 DIAGNOSIS — R1084 Generalized abdominal pain: Secondary | ICD-10-CM

## 2020-07-12 MED FILL — QUETIAPINE FUMARATE 400 MG: 400 | 30 days supply | Qty: 30 | Fill #0

## 2020-07-12 MED FILL — DICYCLOMINE 10 MG CAPSULE: 10 | 15 days supply | Qty: 45 | Fill #0

## 2020-07-15 MED FILL — TOPIRAMATE 100 MG TABLET: 100 | 90 days supply | Qty: 270 | Fill #1

## 2020-07-15 MED FILL — BACLOFEN 10 MG TABS: 10 | 30 days supply | Qty: 60 | Fill #0

## 2020-07-22 MED FILL — tiZANidine HCL 4 MG TABS: 4 | 20 days supply | Qty: 60 | Fill #3

## 2020-07-22 MED FILL — JARDIANCE 10 MG TABLET: 10 | 90 days supply | Qty: 90 | Fill #1

## 2020-07-25 ENCOUNTER — Other Ambulatory Visit: Payer: Self-pay | Admitting: Family Medicine

## 2020-07-25 DIAGNOSIS — R1084 Generalized abdominal pain: Secondary | ICD-10-CM

## 2020-07-25 MED FILL — ESZOPICLONE 2 MG TAB: 2 | 30 days supply | Qty: 30 | Fill #1

## 2020-07-25 MED FILL — MONTELUKAST SOD 10 MG TAB: 10 | 30 days supply | Qty: 30 | Fill #1

## 2020-07-25 MED FILL — ATORVASTATIN 40 MG TABLET: 40 | 90 days supply | Qty: 90 | Fill #1

## 2020-07-26 DIAGNOSIS — M797 Fibromyalgia: Secondary | ICD-10-CM | POA: Diagnosis not present

## 2020-07-26 DIAGNOSIS — M545 Low back pain: Secondary | ICD-10-CM | POA: Diagnosis not present

## 2020-07-26 DIAGNOSIS — G43009 Migraine without aura, not intractable, without status migrainosus: Secondary | ICD-10-CM | POA: Diagnosis not present

## 2020-07-26 MED FILL — GABAPENTIN 600 MG TABLET: 600 | 30 days supply | Qty: 120 | Fill #0

## 2020-07-26 MED FILL — predniSONE 5 MG TABS: 5 | 6 days supply | Qty: 21 | Fill #0

## 2020-07-26 MED FILL — clonazePAM 1 MG TABS: 1 | 30 days supply | Qty: 60 | Fill #0

## 2020-08-02 ENCOUNTER — Other Ambulatory Visit: Payer: Self-pay

## 2020-08-02 MED ORDER — FUROSEMIDE 40 MG PO TABS: 40.0000 mg | ORAL_TABLET | Freq: Every day | ORAL | 1 refills | Status: DC

## 2020-08-04 DIAGNOSIS — M4727 Other spondylosis with radiculopathy, lumbosacral region: Secondary | ICD-10-CM | POA: Diagnosis not present

## 2020-08-04 DIAGNOSIS — S39012A Strain of muscle, fascia and tendon of lower back, initial encounter: Secondary | ICD-10-CM | POA: Diagnosis not present

## 2020-08-04 DIAGNOSIS — X503XXA Overexertion from repetitive movements, initial encounter: Secondary | ICD-10-CM | POA: Diagnosis not present

## 2020-08-04 DIAGNOSIS — M5137 Other intervertebral disc degeneration, lumbosacral region: Secondary | ICD-10-CM | POA: Diagnosis not present

## 2020-08-05 ENCOUNTER — Other Ambulatory Visit: Payer: Self-pay | Admitting: Family Medicine

## 2020-08-05 DIAGNOSIS — R1084 Generalized abdominal pain: Secondary | ICD-10-CM

## 2020-08-06 ENCOUNTER — Other Ambulatory Visit: Payer: Self-pay | Admitting: Family Medicine

## 2020-08-06 MED FILL — DICYCLOMINE 10 MG CAPSULE: 10 | 15 days supply | Qty: 45 | Fill #0

## 2020-08-12 ENCOUNTER — Other Ambulatory Visit (HOSPITAL_COMMUNITY): Payer: Self-pay | Admitting: Internal Medicine

## 2020-08-12 DIAGNOSIS — G43009 Migraine without aura, not intractable, without status migrainosus: Secondary | ICD-10-CM | POA: Diagnosis not present

## 2020-08-12 DIAGNOSIS — M545 Low back pain, unspecified: Secondary | ICD-10-CM | POA: Diagnosis not present

## 2020-08-12 DIAGNOSIS — M25562 Pain in left knee: Secondary | ICD-10-CM | POA: Diagnosis not present

## 2020-08-12 DIAGNOSIS — M797 Fibromyalgia: Secondary | ICD-10-CM | POA: Diagnosis not present

## 2020-08-12 MED FILL — FLUAD QUADRIVALENT 0.5 ML P: 0.5 | 1 days supply | Qty: 1 | Fill #0

## 2020-08-12 MED FILL — SHINGRIX 50 MCG SUS: 50 | 1 days supply | Qty: 1 | Fill #0

## 2020-08-12 MED FILL — predniSONE 10 MG TABS: 10 | 6 days supply | Qty: 21 | Fill #0

## 2020-08-13 MED FILL — BACLOFEN 10 MG TABS: 10 | 30 days supply | Qty: 60 | Fill #1

## 2020-08-14 MED FILL — DULoxetine HCL 60 MG CPEP: 60 | 30 days supply | Qty: 60 | Fill #5

## 2020-08-15 ENCOUNTER — Emergency Department (HOSPITAL_COMMUNITY)
Admission: EM | Admit: 2020-08-15 | Discharge: 2020-08-15 | Disposition: A | Discharge status: Home / Self Care | Payer: 59 | Attending: Emergency Medicine | Admitting: Emergency Medicine

## 2020-08-15 ENCOUNTER — Emergency Department (HOSPITAL_COMMUNITY): Payer: 59

## 2020-08-15 ENCOUNTER — Encounter (HOSPITAL_COMMUNITY): Payer: Self-pay | Admitting: Emergency Medicine

## 2020-08-15 ENCOUNTER — Other Ambulatory Visit: Payer: Self-pay

## 2020-08-15 ENCOUNTER — Encounter: Payer: Self-pay | Admitting: Orthopaedic Surgery

## 2020-08-15 ENCOUNTER — Ambulatory Visit (INDEPENDENT_AMBULATORY_CARE_PROVIDER_SITE_OTHER): Payer: 59 | Admitting: Orthopaedic Surgery

## 2020-08-15 ENCOUNTER — Other Ambulatory Visit (HOSPITAL_COMMUNITY): Payer: Self-pay | Admitting: Emergency Medicine

## 2020-08-15 DIAGNOSIS — J45909 Unspecified asthma, uncomplicated: Secondary | ICD-10-CM | POA: Insufficient documentation

## 2020-08-15 DIAGNOSIS — M25562 Pain in left knee: Secondary | ICD-10-CM

## 2020-08-15 DIAGNOSIS — E119 Type 2 diabetes mellitus without complications: Secondary | ICD-10-CM | POA: Insufficient documentation

## 2020-08-15 DIAGNOSIS — Z91010 Allergy to peanuts: Secondary | ICD-10-CM | POA: Diagnosis not present

## 2020-08-15 DIAGNOSIS — R1010 Upper abdominal pain, unspecified: Secondary | ICD-10-CM

## 2020-08-15 DIAGNOSIS — Z79899 Other long term (current) drug therapy: Secondary | ICD-10-CM | POA: Diagnosis not present

## 2020-08-15 DIAGNOSIS — I10 Essential (primary) hypertension: Secondary | ICD-10-CM | POA: Insufficient documentation

## 2020-08-15 DIAGNOSIS — Z7984 Long term (current) use of oral hypoglycemic drugs: Secondary | ICD-10-CM | POA: Diagnosis not present

## 2020-08-15 DIAGNOSIS — R1012 Left upper quadrant pain: Secondary | ICD-10-CM | POA: Diagnosis not present

## 2020-08-15 LAB — COMPREHENSIVE METABOLIC PANEL
ALT: 18 U/L (ref 0–44)
AST: 16 U/L (ref 15–41)
Albumin: 3.7 g/dL (ref 3.5–5.0)
Alkaline Phosphatase: 97 U/L (ref 38–126)
Anion gap: 14 (ref 5–15)
BUN: 15 mg/dL (ref 8–23)
CO2: 17 mmol/L — ABNORMAL LOW (ref 22–32)
Calcium: 9 mg/dL (ref 8.9–10.3)
Chloride: 107 mmol/L (ref 98–111)
Creatinine, Ser: 0.64 mg/dL (ref 0.44–1.00)
GFR, Estimated: >60 mL/min (ref >60)
Glucose, Bld: 259 mg/dL — ABNORMAL HIGH (ref 70–99)
Potassium: 3.2 mmol/L — ABNORMAL LOW (ref 3.5–5.1)
Sodium: 138 mmol/L (ref 135–145)
Total Bilirubin: 0.2 mg/dL — ABNORMAL LOW (ref 0.3–1.2)
Total Protein: 7 g/dL (ref 6.5–8.1)

## 2020-08-15 LAB — CBC
HCT: 31.2 % — ABNORMAL LOW (ref 36.0–46.0)
Hemoglobin: 9 g/dL — ABNORMAL LOW (ref 12.0–15.0)
MCH: 24 pg — ABNORMAL LOW (ref 26.0–34.0)
MCHC: 28.8 g/dL — ABNORMAL LOW (ref 30.0–36.0)
MCV: 83.2 fL (ref 80.0–100.0)
Platelets: 592 10*3/uL — ABNORMAL HIGH (ref 150–400)
RBC: 3.75 MIL/uL — ABNORMAL LOW (ref 3.87–5.11)
RDW: 27 % — ABNORMAL HIGH (ref 11.5–15.5)
WBC: 11.1 10*3/uL — ABNORMAL HIGH (ref 4.0–10.5)
nRBC: 0 % (ref 0.0–0.2)

## 2020-08-15 LAB — URINALYSIS, ROUTINE W REFLEX MICROSCOPIC
Bacteria, UA: NONE SEEN
Bilirubin Urine: NEGATIVE
Glucose, UA: >=500 mg/dL — AB
Hgb urine dipstick: NEGATIVE
Ketones, ur: 5 mg/dL — AB
Leukocytes,Ua: NEGATIVE
Nitrite: NEGATIVE
Protein, ur: NEGATIVE mg/dL
Specific Gravity, Urine: 1.024 (ref 1.005–1.030)
pH: 6 (ref 5.0–8.0)

## 2020-08-15 LAB — LIPASE, BLOOD: Lipase: 61 U/L — ABNORMAL HIGH (ref 11–51)

## 2020-08-15 MED ORDER — LIDOCAINE HCL 1 % IJ SOLN
2.0000 mL | INTRAMUSCULAR | Status: AC | PRN
  Administered 2020-08-15: 2 mL

## 2020-08-15 MED ORDER — BUPIVACAINE HCL 0.5 % IJ SOLN
2.0000 mL | INTRAMUSCULAR | Status: AC | PRN
  Administered 2020-08-15: 2 mL via INTRA_ARTICULAR

## 2020-08-15 MED ORDER — HYDROCODONE-ACETAMINOPHEN 5-325 MG PO TABS
1.0000 | ORAL_TABLET | Freq: Once | ORAL | Status: AC
  Administered 2020-08-15: 1 via ORAL
  Filled 2020-08-15: qty 1

## 2020-08-15 MED ORDER — HYDROCODONE-ACETAMINOPHEN 5-325 MG PO TABS
1.0000 | ORAL_TABLET | Freq: Four times a day (QID) | ORAL | 0 refills | Status: DC | PRN
Start: 2020-08-15 — End: 2020-08-22

## 2020-08-15 MED FILL — EMGALITY 120 MG/ML SOAJ: 120 | 30 days supply | Qty: 1 | Fill #0

## 2020-08-15 MED FILL — HYDROCODON-APAP 5-325: 5-325 | 4 days supply | Qty: 14 | Fill #0

## 2020-08-15 NOTE — Progress Notes (Signed)
Orthopedic Tech Progress Note Patient Details:  Kelly Shannon [birth year] [id number]  Ortho Devices Type of Ortho Device: Knee Immobilizer Ortho Device/Splint Location: LLE Ortho Device/Splint Interventions: Application, Ordered   Post Interventions Patient Tolerated: Well Instructions Provided: Adjustment of device   Severiano Utsey A Elizzie Westergard 08/15/2020, 1:57 PM

## 2020-08-15 NOTE — ED Triage Notes (Signed)
Pt arrives to ED with c/o of abdominal pain as well as left knee pain. Pt states her abdominal pain began last night and states is more epigastric and occurring right after eating.  Pt denies any N/V/D or fevers. Pt has no CP or SOB.   Pt also states the lateral portion of her left knee has been sore for over 1-2 weeks, no trauma. No swelling or deformity noted.

## 2020-08-15 NOTE — ED Notes (Signed)
Walked patient to the bathroom patient did well 

## 2020-08-15 NOTE — Discharge Instructions (Addendum)
Wear the knee immobilizer for comfort.  Take the hydrocodone as directed.  Call [facility] orthopedics now Ortho care for follow-up information provided above.  I would recommend follow-up with primary care doctor for the upper abdominal pain.  Would recommend outpatient ultrasound.

## 2020-08-15 NOTE — Progress Notes (Signed)
Office Visit Note   Patient: Kelly Shannon           Date of Birth: 1951/05/04           MRN: [id number] Visit Date: 08/15/2020              Requested by: Mirian Mo, MD [address] [place],  [place] [address] PCP: Mirian Mo, MD   Assessment & Plan: Visit Diagnoses:  1. Acute pain of left knee     Plan: [name] experienced rather acute onset of left knee pain about a week ago.  No history of injury or trauma.  She visited the emergency room today.  X-rays were obtained and were negative for any acute changes.  I did review the films and there was some mild degenerative change in the medial compartment more than lateral with subchondral sclerosis.  There was an area of irregularity in the mid femoral weightbearing cyst joint surface which certainly could be consistent with an area of grade IV chondromalacia. The joint space is well-maintained. did not see any significant osteophytes.  There was no effusion.  Her knee looked pretty benign to me but she was tender more laterally than medially.  I have injected the knee with cortisone and warned her that this may elevate her blood sugars and that she may be sore for a day or 2.  She has an immobilizer from the emergency room and hydrocodone for pain.  Hopefully this will make a difference.  If not I had like her to call in a week or 10 days and will obtain an MRI scan  Follow-Up Instructions: Return if symptoms worsen or fail to improve.   Orders:  Orders Placed This Encounter  Procedures  . Large Joint Inj: L knee   No orders of the defined types were placed in this encounter.     Procedures: Large Joint Inj: L knee on 08/15/2020 4:11 PM Indications: pain and diagnostic evaluation Details: 25 G 1.5 in needle, anteromedial approach  Arthrogram: No  Medications: 2 mL lidocaine 1 %; 2 mL bupivacaine 0.5 %  2 ml betamethasone Procedure, treatment alternatives, risks and benefits explained, specific risks discussed.  Consent was given by the patient. Patient was prepped and draped in the usual sterile fashion.       Clinical Data: No additional findings.   Subjective: Chief Complaint  Patient presents with  . Left Knee - Pain  Patient presents today for her left knee. She states that it started to hurt last week. No known injury. Her pain is located laterally, and has some swelling in that same area. She said that it is "severe pain", and constant regardless of weightbearing or not. She was seen at [facility] ED today and had x-rays. She said that they gave her hydrocodone to take for pain, but would prefer something less. No previous knee surgery in the past. She said that she cried all night last night due to pain, and unable to sleep.  History of fibromyalgia and diabetes  HPI  Review of Systems  Constitutional: Negative for fatigue.  HENT: Negative for ear pain.   Eyes: Negative for pain.  Respiratory: Negative for shortness of breath.   Cardiovascular: Negative for leg swelling.  Gastrointestinal: Negative for constipation and diarrhea.  Endocrine: Negative for cold intolerance and heat intolerance.  Genitourinary: Negative for difficulty urinating.  Musculoskeletal: Positive for joint swelling.  Skin: Negative for rash.  Allergic/Immunologic: Positive for food allergies.  Neurological: Negative  for weakness.  Hematological: Does not bruise/bleed easily.  Psychiatric/Behavioral: Positive for sleep disturbance.     Objective: Vital Signs: Ht 5\' 3"  (1.6 m)   Wt 158 lb (71.7 kg)   BMI 27.99 kg/m   Physical Exam Constitutional:      Appearance: She is well-developed.  Eyes:     Pupils: Pupils are equal, round, and reactive to light.  Pulmonary:     Effort: Pulmonary effort is normal.  Skin:    General: Skin is warm and dry.  Neurological:     Mental Status: She is alert and oriented to person, place, and time.  Psychiatric:        Behavior: Behavior normal.     Ortho  Exam awake alert and oriented x3.  Nicely dressed and in no acute distress.  Left knee may have had a very minimal effusion.  There was some some mild medial and more lateral joint tenderness.  No masses.  No crepitation.  Full quick extension and flexed over 100 degrees.  No opening with varus valgus stress..  Negative anterior drawer sign.  Straight leg raise negative.  Painless range of motion left hip.  No areas of tenderness about the IT band or about the patella. Specialty Comments:  No specialty comments available.  Imaging: DG Knee Complete 4 Views Left  Result Date: 08/15/2020 CLINICAL DATA:  Pain lateral surface of the knee without injury for 1 week. EXAM: LEFT KNEE - COMPLETE 4+ VIEW COMPARISON:  None. FINDINGS: No evidence of fracture, dislocation, or joint effusion. No evidence of arthropathy or other focal bone abnormality. Soft tissues are unremarkable. IMPRESSION: Negative evaluation of the LEFT knee Electronically Signed   By: 08/17/2020 M.D.   On: 08/15/2020 11:25     PMFS History: Patient Active Problem List   Diagnosis Date Noted  . Pain in left knee 08/15/2020  . Generalized abdominal pain 02/20/2020  . Colon cancer screening 09/23/2018  . Mild persistent asthma 04/21/2016  . 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 Chronic pain associated with significant psychosocial dysfunction 12/19/2015  . 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 Diabetes mellitus (HCC) 12/19/2015  . Fibromyalgia 12/19/2015  . HLD (hyperlipidemia) 12/19/2015  . Hypertension 12/19/2015  . Cannot sleep 12/19/2015  . Lumbar radiculopathy 12/19/2015  . Polypharmacy 11/29/2015  . Impaired renal function 11/29/2015  . Chronic ethmoidal sinusitis 10/01/2015  . Antritis chronic 09/18/2015  . Anemia 01/18/2015  . 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 Cardiac murmur 03/08/2012  . Chronic neck pain 02/10/2012  . Chronic elbow pain  02/10/2012  . Bilateral chronic knee pain 02/10/2012  . Chronic hand pain 02/10/2012  . Chronic ankle pain 02/10/2012  . Chronic pain 02/10/2012  . Allergic rhinitis 09/22/2011  . Bipolar affective disorder (HCC) 09/22/2011  . Hot flash, menopausal 09/22/2011  . Menopausal and perimenopausal disorder 09/15/2011  . Bipolar affective disorder, depressed, severe, with psychotic behavior (HCC) 08/11/2011  . Type 2 diabetes mellitus (HCC) 08/11/2011  . Hypercholesterolemia 08/11/2011  . Constipation 02/16/2010  . Disturbance in sleep behavior 06/16/2009   Past Medical History:  Diagnosis Date  . Asthma   . Diabetes mellitus   . Hyperlipidemia   . Hypertension     Family History  Problem Relation Age of Onset  . Diabetes Mother   . Heart disease Mother   . Heart disease Father     Past Surgical History:  Procedure Laterality Date  . ABDOMINAL HYSTERECTOMY  Social History   Occupational History  . Not on file  Tobacco Use  . Smoking status: Never Smoker  . Smokeless tobacco: Never Used  Substance and Sexual Activity  . Alcohol use: No  . Drug use: No  . Sexual activity: Yes

## 2020-08-15 NOTE — ED Notes (Signed)
Got patient undress on the monitor into a gown patient is resting with call bell in reach 

## 2020-08-15 NOTE — ED Provider Notes (Addendum)
[facility] EMERGENCY DEPARTMENT Provider Note   CSN: [id number] Arrival date & time: 08/15/20  4158     History Chief Complaint  Patient presents with  . Abdominal Pain  . Knee Pain    Kelly Shannon is a 69 y.o. female.  Patient originally had a complaint of abdominal pain kind of left upper quadrant as well as left knee pain.  Stated abdominal pain started last night.  But is now resolved.  Patient's not concerned about that anymore.  But is having persistent left knee pain.  Patient denies any fevers nausea vomiting or diarrhea.  Patient has no chest pain or shortness of breath.  Patient has a history of chronic pain diabetes hypertension hyperlipidemia followed by neurology.  Also has a history of fibromyalgia.        Past Medical History:  Diagnosis Date  . Asthma   . Diabetes mellitus   . Hyperlipidemia   . Hypertension     Patient Active Problem List   Diagnosis Date Noted  . Generalized abdominal pain 02/20/2020  . Colon cancer screening 09/23/2018  . Mild persistent asthma 04/21/2016  . 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 Chronic pain associated with significant psychosocial dysfunction 12/19/2015  . 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 Diabetes mellitus (HCC) 12/19/2015  . Fibromyalgia 12/19/2015  . HLD (hyperlipidemia) 12/19/2015  . Hypertension 12/19/2015  . Cannot sleep 12/19/2015  . Lumbar radiculopathy 12/19/2015  . Polypharmacy 11/29/2015  . Impaired renal function 11/29/2015  . Chronic ethmoidal sinusitis 10/01/2015  . Antritis chronic 09/18/2015  . Anemia 01/18/2015  . 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 Cardiac murmur 03/08/2012  . Chronic neck pain 02/10/2012  . Chronic elbow pain 02/10/2012  . Bilateral chronic knee pain 02/10/2012  . Chronic hand pain 02/10/2012  . Chronic ankle pain 02/10/2012  . Chronic pain 02/10/2012  . Allergic rhinitis  09/22/2011  . Bipolar affective disorder (HCC) 09/22/2011  . Hot flash, menopausal 09/22/2011  . Menopausal and perimenopausal disorder 09/15/2011  . Bipolar affective disorder, depressed, severe, with psychotic behavior (HCC) 08/11/2011  . Type 2 diabetes mellitus (HCC) 08/11/2011  . Hypercholesterolemia 08/11/2011  . Constipation 02/16/2010  . Disturbance in sleep behavior 06/16/2009    Past Surgical History:  Procedure Laterality Date  . ABDOMINAL HYSTERECTOMY       OB History   No obstetric history on file.     Family History  Problem Relation Age of Onset  . Diabetes Mother   . Heart disease Mother   . Heart disease Father     Social History   Tobacco Use  . Smoking status: Never Smoker  . Smokeless tobacco: Never Used  Substance Use Topics  . Alcohol use: No  . Drug use: No    Home Medications Prior to Admission medications   Medication Sig Start Date End Date Taking? Authorizing Provider  albuterol (VENTOLIN HFA) 108 (90 Base) MCG/ACT inhaler INHALE 2 PUFFS BY MOUTH INTO THE LUNGS EVERY 4 HOURS AS NEEDED FOR WHEEZING OR SHORTNESS OF BREATH. 06/25/20   Mirian Mo, MD  atorvastatin (LIPITOR) 40 MG tablet TAKE 1 TABLET BY MOUTH DAILY. 04/18/20   Mirian Mo, MD  clonazePAM (KLONOPIN) 1 MG tablet Take 1 mg by mouth 2 (two) times daily as needed.    [provider]  dicyclomine (BENTYL) 10 MG capsule TAKE 1 CAPSULE BY MOUTH THREE TIMES DAILY AS NEEDED FOR SPASMS. 08/06/20   Mirian Mo, MD  DULoxetine (  CYMBALTA) 30 MG capsule  01/31/16   [provider]  empagliflozin (JARDIANCE) 10 MG TABS tablet Take 1 tablet (10 mg total) by mouth daily. 04/15/20   Mirian MoFrank, Peter, MD  ferrous sulfate (FERROUSUL) 325 (65 FE) MG tablet Take 1 tablet (325 mg total) by mouth daily with breakfast. 02/29/20   Mirian MoFrank, Peter, MD  fluticasone (FLONASE) 50 MCG/ACT nasal spray TWO SPRAYS EACH NOSTRIL ONCE A DAY FOR NASAL CONGESTION OR DRAINAGE 10/18/17   Almon HerculesGonfa, Taye T, MD   furosemide (LASIX) 40 MG tablet Take 1 tablet (40 mg total) by mouth daily. 08/02/20   Mirian MoFrank, Peter, MD  gabapentin (NEURONTIN) 300 MG capsule Take by mouth. 10/09/15   [provider]  glucose blood (FREESTYLE LITE) test strip USE TO CHECK BLOOD SUGAR DAILY IN THE MORNING 06/01/18   Tillman Sersiccio, Angela C, DO  LINZESS 72 MCG capsule TAKE 1 CAPSULE (72 MCG TOTAL) BY MOUTH DAILY BEFORE BREAKFAST. 06/10/20   Mirian MoFrank, Peter, MD  losartan (COZAAR) 25 MG tablet TAKE 1 TABLET BY MOUTH DAILY. 05/17/18   Tillman Sersiccio, Angela C, DO  metFORMIN (GLUCOPHAGE) 1000 MG tablet TAKE 1 TABLET BY MOUTH DAILY WITH A MEAL FOR 2 WEEKS, THEN INCREASE TO 2 TABLETS DAILY 04/28/19   Wonda Oldsiccio, Angela C, DO  montelukast (SINGULAIR) 10 MG tablet TAKE 1 TABLET (10 MG TOTAL) BY MOUTH AT BEDTIME. 06/24/20   Mirian MoFrank, Peter, MD  tizanidine (ZANAFLEX) 6 MG capsule Take 6 mg by mouth 3 (three) times daily. 04/13/16   [provider]  topiramate (TOPAMAX) 25 MG tablet  01/17/16   [provider]    Allergies    Lyrica [pregabalin], Peanut oil, Penicillins, Sulfa antibiotics, and Sulfamethoxazole-trimethoprim  Review of Systems   Review of Systems  Constitutional: Negative for chills and fever.  HENT: Negative for rhinorrhea and sore throat.   Eyes: Negative for visual disturbance.  Respiratory: Negative for cough and shortness of breath.   Cardiovascular: Negative for chest pain and leg swelling.  Gastrointestinal: Positive for abdominal pain. Negative for diarrhea, nausea and vomiting.  Genitourinary: Negative for dysuria.  Musculoskeletal: Negative for back pain, joint swelling and neck pain.  Skin: Negative for rash.  Neurological: Negative for dizziness, light-headedness and headaches.  Hematological: Does not bruise/bleed easily.  Psychiatric/Behavioral: Negative for confusion.    Physical Exam Updated Vital Signs BP (!) 163/75   Pulse 76   Temp 97.8 F (36.6 C) (Oral)   Resp 20   Ht 1.6 m (5\' 3" )   Wt 71.7  kg   SpO2 100%   BMI 27.99 kg/m   Physical Exam Vitals and nursing note reviewed.  Constitutional:      General: She is not in acute distress.    Appearance: She is well-developed.  HENT:     Head: Normocephalic and atraumatic.  Eyes:     Conjunctiva/sclera: Conjunctivae normal.     Pupils: Pupils are equal, round, and reactive to light.  Cardiovascular:     Rate and Rhythm: Normal rate and regular rhythm.     Heart sounds: No murmur heard.   Pulmonary:     Effort: Pulmonary effort is normal. No respiratory distress.     Breath sounds: Normal breath sounds.  Abdominal:     Palpations: Abdomen is soft.     Tenderness: There is no abdominal tenderness.     Comments: Abdomen completely nontender.  Musculoskeletal:        General: Swelling and tenderness present.     Cervical back: Neck supple.  Comments: Left knee with no erythema may be some slight increased warmth.  May be a slight amount of swelling.  Mild tenderness to palpation over left lateral meniscus area.  Distally dorsalis pedis pulse 2+.  Good movement of the toes foot ankle sensation intact.  Skin:    General: Skin is warm and dry.     Capillary Refill: Capillary refill takes less than 2 seconds.  Neurological:     General: No focal deficit present.     Mental Status: She is alert and oriented to person, place, and time.     Cranial Nerves: No cranial nerve deficit.     Sensory: No sensory deficit.     Motor: No weakness.     ED Results / Procedures / Treatments   Labs (all labs ordered are listed, but only abnormal results are displayed) Labs Reviewed  LIPASE, BLOOD - Abnormal; Notable for the following components:      Result Value   Lipase 61 (*)    All other components within normal limits  COMPREHENSIVE METABOLIC PANEL - Abnormal; Notable for the following components:   Potassium 3.2 (*)    CO2 17 (*)    Glucose, Bld 259 (*)    Total Bilirubin 0.2 (*)    All other components within normal  limits  CBC - Abnormal; Notable for the following components:   WBC 11.1 (*)    RBC 3.75 (*)    Hemoglobin 9.0 (*)    HCT 31.2 (*)    MCH 24.0 (*)    MCHC 28.8 (*)    RDW 27.0 (*)    Platelets 592 (*)    All other components within normal limits  URINALYSIS, ROUTINE W REFLEX MICROSCOPIC - Abnormal; Notable for the following components:   Color, Urine STRAW (*)    Glucose, UA >=500 (*)    Ketones, ur 5 (*)    All other components within normal limits    EKG EKG Interpretation  Date/Time:  Thursday August 15 2020 09:53:14 EDT Ventricular Rate:  73 PR Interval:  140 QRS Duration: 80 QT Interval:  374 QTC Calculation: 412 R Axis:   69 Text Interpretation: Normal sinus rhythm Normal ECG Confirmed by Vanetta Mulders [phone]) on 08/15/2020 10:38:50 AM   Radiology DG Knee Complete 4 Views Left  Result Date: 08/15/2020 CLINICAL DATA:  Pain lateral surface of the knee without injury for 1 week. EXAM: LEFT KNEE - COMPLETE 4+ VIEW COMPARISON:  None. FINDINGS: No evidence of fracture, dislocation, or joint effusion. No evidence of arthropathy or other focal bone abnormality. Soft tissues are unremarkable. IMPRESSION: Negative evaluation of the LEFT knee Electronically Signed   By: Donzetta Kohut M.D.   On: 08/15/2020 11:25    Procedures Procedures (including critical care time)  Medications Ordered in ED Medications  HYDROcodone-acetaminophen (NORCO/VICODIN) 5-325 MG per tablet 1 tablet (has no administration in time range)  HYDROcodone-acetaminophen (NORCO/VICODIN) 5-325 MG per tablet 1 tablet (1 tablet Oral Given 08/15/20 1103)    ED Course  I have reviewed the triage vital signs and the nursing notes.  Pertinent labs & imaging results that were available during my care of the patient were reviewed by me and considered in my medical decision making (see chart for details).    MDM Rules/Calculators/A&P                          Work-up for the knee pain without evidence of  any bony abnormalities  on the x-ray.  Will treat symptomatically with knee immobilizer and short course of hydrocodone and follow-up with Ortho care that she has been seen by in the past.  Patient did not want work-up further for the abdominal pain since it resolved.  Urinalysis negative.  Lipase up slightly at 61.  Patient's blood sugar elevated 259 otherwise liver function test are normal.  Slight leukocytosis.  Abdomen clear the soft and nontender.  It is possible patient could be experiencing some gallbladder symptoms.  Patient's only prior abdominal surgery was abdominal hysterectomy.  We will have patient follow-up with primary care doctor regarding the episodic episodes of upper quadrant abdominal pain.  She will return for any new or worse symptoms.   Final Clinical Impression(s) / ED Diagnoses Final diagnoses:  Acute pain of left knee  Pain of upper abdomen    Rx / DC Orders ED Discharge Orders    None       Vanetta Mulders, MD 08/15/20 1342    Vanetta Mulders, MD 08/15/20 1343

## 2020-08-19 ENCOUNTER — Other Ambulatory Visit: Payer: Self-pay | Admitting: Family Medicine

## 2020-08-19 MED FILL — FREESTYLE LITE TEST STRIP: 50 days supply | Qty: 50 | Fill #0

## 2020-08-20 ENCOUNTER — Emergency Department (HOSPITAL_COMMUNITY): Payer: 59

## 2020-08-20 ENCOUNTER — Emergency Department (HOSPITAL_COMMUNITY)
Admission: EM | Admit: 2020-08-20 | Discharge: 2020-08-20 | Disposition: A | Discharge status: Home / Self Care | Payer: 59 | Attending: Emergency Medicine | Admitting: Emergency Medicine

## 2020-08-20 DIAGNOSIS — R4182 Altered mental status, unspecified: Secondary | ICD-10-CM | POA: Insufficient documentation

## 2020-08-20 DIAGNOSIS — I10 Essential (primary) hypertension: Secondary | ICD-10-CM | POA: Insufficient documentation

## 2020-08-20 DIAGNOSIS — Z79899 Other long term (current) drug therapy: Secondary | ICD-10-CM | POA: Insufficient documentation

## 2020-08-20 DIAGNOSIS — Z20822 Contact with and (suspected) exposure to covid-19: Secondary | ICD-10-CM | POA: Insufficient documentation

## 2020-08-20 DIAGNOSIS — J4530 Mild persistent asthma, uncomplicated: Secondary | ICD-10-CM | POA: Diagnosis not present

## 2020-08-20 DIAGNOSIS — W19XXXA Unspecified fall, initial encounter: Secondary | ICD-10-CM | POA: Diagnosis not present

## 2020-08-20 DIAGNOSIS — Z91010 Allergy to peanuts: Secondary | ICD-10-CM | POA: Diagnosis not present

## 2020-08-20 DIAGNOSIS — E119 Type 2 diabetes mellitus without complications: Secondary | ICD-10-CM | POA: Diagnosis not present

## 2020-08-20 DIAGNOSIS — Z7984 Long term (current) use of oral hypoglycemic drugs: Secondary | ICD-10-CM | POA: Insufficient documentation

## 2020-08-20 DIAGNOSIS — R0902 Hypoxemia: Secondary | ICD-10-CM | POA: Diagnosis not present

## 2020-08-20 LAB — RESPIRATORY PANEL BY RT PCR (FLU A&B, COVID)
Influenza A by PCR: NEGATIVE
Influenza B by PCR: NEGATIVE
SARS Coronavirus 2 by RT PCR: NEGATIVE

## 2020-08-20 LAB — CBC WITH DIFFERENTIAL/PLATELET
Abs Immature Granulocytes: 0 10*3/uL (ref 0.00–0.07)
Basophils Absolute: 0.1 10*3/uL (ref 0.0–0.1)
Basophils Relative: 1 %
Eosinophils Absolute: 0.1 10*3/uL (ref 0.0–0.5)
Eosinophils Relative: 1 %
HCT: 31 % — ABNORMAL LOW (ref 36.0–46.0)
Hemoglobin: 9.2 g/dL — ABNORMAL LOW (ref 12.0–15.0)
Lymphocytes Relative: 10 %
Lymphs Abs: 1 10*3/uL (ref 0.7–4.0)
MCH: 24.3 pg — ABNORMAL LOW (ref 26.0–34.0)
MCHC: 29.7 g/dL — ABNORMAL LOW (ref 30.0–36.0)
MCV: 82 fL (ref 80.0–100.0)
Monocytes Absolute: 0.4 10*3/uL (ref 0.1–1.0)
Monocytes Relative: 4 %
Neutro Abs: 8.7 10*3/uL — ABNORMAL HIGH (ref 1.7–7.7)
Neutrophils Relative %: 84 %
Platelets: 539 10*3/uL — ABNORMAL HIGH (ref 150–400)
RBC: 3.78 MIL/uL — ABNORMAL LOW (ref 3.87–5.11)
RDW: 25.2 % — ABNORMAL HIGH (ref 11.5–15.5)
WBC: 10.3 10*3/uL (ref 4.0–10.5)
nRBC: 0 % (ref 0.0–0.2)
nRBC: 0 /100 WBC

## 2020-08-20 LAB — COMPREHENSIVE METABOLIC PANEL
ALT: 18 U/L (ref 0–44)
AST: 17 U/L (ref 15–41)
Albumin: 3.3 g/dL — ABNORMAL LOW (ref 3.5–5.0)
Alkaline Phosphatase: 86 U/L (ref 38–126)
Anion gap: 12 (ref 5–15)
BUN: 14 mg/dL (ref 8–23)
CO2: 20 mmol/L — ABNORMAL LOW (ref 22–32)
Calcium: 9.2 mg/dL (ref 8.9–10.3)
Chloride: 107 mmol/L (ref 98–111)
Creatinine, Ser: 0.63 mg/dL (ref 0.44–1.00)
GFR, Estimated: >60 mL/min (ref >60)
Glucose, Bld: 158 mg/dL — ABNORMAL HIGH (ref 70–99)
Potassium: 3.2 mmol/L — ABNORMAL LOW (ref 3.5–5.1)
Sodium: 139 mmol/L (ref 135–145)
Total Bilirubin: 0.3 mg/dL (ref 0.3–1.2)
Total Protein: 6.5 g/dL (ref 6.5–8.1)

## 2020-08-20 LAB — ACETAMINOPHEN LEVEL: Acetaminophen (Tylenol), Serum: <10 ug/mL — ABNORMAL LOW (ref 10–30)

## 2020-08-20 LAB — ETHANOL: Alcohol, Ethyl (B): <10 mg/dL (ref <10)

## 2020-08-20 LAB — SALICYLATE LEVEL: Salicylate Lvl: <7 mg/dL — ABNORMAL LOW (ref 7.0–30.0)

## 2020-08-20 NOTE — Discharge Instructions (Addendum)
CT scan, and other testing today was normal.  Follow-up with your primary care doctor for further care and treatment as needed.  Try to take your medicine as prescribed.

## 2020-08-20 NOTE — ED Provider Notes (Signed)
[facility] EMERGENCY DEPARTMENT Provider Note   CSN: [id number] Arrival date & time: 08/20/20  1133     History Chief Complaint  Patient presents with  . Altered Mental Status    Kelly Shannon is a 69 y.o. female.   Altered Mental Status Presenting symptoms: behavior changes and confusion   Severity:  Moderate Most recent episode:  Today Episode history:  Single Timing:  Constant Progression:  Unchanged Chronicity:  New Context comment:  Took too many narcotic pain pills due to knee pain Associated symptoms: no fever, no headaches, no light-headedness, no nausea, no palpitations, no rash and no vomiting        Past Medical History:  Diagnosis Date  . Asthma   . Diabetes mellitus   . Hyperlipidemia   . Hypertension     Patient Active Problem List   Diagnosis Date Noted  . Pain in left knee 08/15/2020  . Generalized abdominal pain 02/20/2020  . Colon cancer screening 09/23/2018  . Mild persistent asthma 04/21/2016  . 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 Chronic pain associated with significant psychosocial dysfunction 12/19/2015  . 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 Diabetes mellitus (HCC) 12/19/2015  . Fibromyalgia 12/19/2015  . HLD (hyperlipidemia) 12/19/2015  . Hypertension 12/19/2015  . Cannot sleep 12/19/2015  . Lumbar radiculopathy 12/19/2015  . Polypharmacy 11/29/2015  . Impaired renal function 11/29/2015  . Chronic ethmoidal sinusitis 10/01/2015  . Antritis chronic 09/18/2015  . Anemia 01/18/2015  . 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 Cardiac murmur 03/08/2012  . Chronic neck pain 02/10/2012  . Chronic elbow pain 02/10/2012  . Bilateral chronic knee pain 02/10/2012  . Chronic hand pain 02/10/2012  . Chronic ankle pain 02/10/2012  . Chronic pain 02/10/2012  . Allergic rhinitis 09/22/2011  . Bipolar affective disorder (HCC) 09/22/2011  . Hot flash,  menopausal 09/22/2011  . Menopausal and perimenopausal disorder 09/15/2011  . Bipolar affective disorder, depressed, severe, with psychotic behavior (HCC) 08/11/2011  . Type 2 diabetes mellitus (HCC) 08/11/2011  . Hypercholesterolemia 08/11/2011  . Constipation 02/16/2010  . Disturbance in sleep behavior 06/16/2009    Past Surgical History:  Procedure Laterality Date  . ABDOMINAL HYSTERECTOMY       OB History   No obstetric history on file.     Family History  Problem Relation Age of Onset  . Diabetes Mother   . Heart disease Mother   . Heart disease Father     Social History   Tobacco Use  . Smoking status: Never Smoker  . Smokeless tobacco: Never Used  Substance Use Topics  . Alcohol use: No  . Drug use: No    Home Medications Prior to Admission medications   Medication Sig Start Date End Date Taking? Authorizing Provider  albuterol (VENTOLIN HFA) 108 (90 Base) MCG/ACT inhaler INHALE 2 PUFFS BY MOUTH INTO THE LUNGS EVERY 4 HOURS AS NEEDED FOR WHEEZING OR SHORTNESS OF BREATH. 06/25/20   Mirian Mo, MD  atorvastatin (LIPITOR) 40 MG tablet TAKE 1 TABLET BY MOUTH DAILY. 04/18/20   Mirian Mo, MD  clonazePAM (KLONOPIN) 1 MG tablet Take 1 mg by mouth 2 (two) times daily as needed.    [provider]  dicyclomine (BENTYL) 10 MG capsule TAKE 1 CAPSULE BY MOUTH THREE TIMES DAILY AS NEEDED FOR SPASMS. 08/06/20   Mirian Mo, MD  DULoxetine (CYMBALTA) 30 MG capsule  01/31/16   [provider]  empagliflozin (JARDIANCE) 10 MG TABS tablet Take  1 tablet (10 mg total) by mouth daily. 04/15/20   Mirian Mo, MD  ferrous sulfate (FERROUSUL) 325 (65 FE) MG tablet Take 1 tablet (325 mg total) by mouth daily with breakfast. 02/29/20   Mirian Mo, MD  fluticasone (FLONASE) 50 MCG/ACT nasal spray TWO SPRAYS EACH NOSTRIL ONCE A DAY FOR NASAL CONGESTION OR DRAINAGE 10/18/17   Almon Hercules, MD  furosemide (LASIX) 40 MG tablet Take 1 tablet (40 mg total) by mouth daily.  08/02/20   Mirian Mo, MD  gabapentin (NEURONTIN) 300 MG capsule Take by mouth. 10/09/15   [provider]  glucose blood (FREESTYLE LITE) test strip USE TO CHECK BLOOD SUGAR DAILY IN THE MORNING 08/19/20   Mirian Mo, MD  HYDROcodone-acetaminophen (NORCO/VICODIN) 5-325 MG tablet Take 1 tablet by mouth every 6 (six) hours as needed for moderate pain. 08/15/20   Vanetta Mulders, MD  LINZESS 72 MCG capsule TAKE 1 CAPSULE (72 MCG TOTAL) BY MOUTH DAILY BEFORE BREAKFAST. 06/10/20   Mirian Mo, MD  losartan (COZAAR) 25 MG tablet TAKE 1 TABLET BY MOUTH DAILY. 05/17/18   Tillman Sers, DO  metFORMIN (GLUCOPHAGE) 1000 MG tablet TAKE 1 TABLET BY MOUTH DAILY WITH A MEAL FOR 2 WEEKS, THEN INCREASE TO 2 TABLETS DAILY 04/28/19   Wonda Olds, Angela C, DO  montelukast (SINGULAIR) 10 MG tablet TAKE 1 TABLET (10 MG TOTAL) BY MOUTH AT BEDTIME. 06/24/20   Mirian Mo, MD  tizanidine (ZANAFLEX) 6 MG capsule Take 6 mg by mouth 3 (three) times daily. 04/13/16   [provider]  topiramate (TOPAMAX) 25 MG tablet  01/17/16   [provider]    Allergies    Lyrica [pregabalin], Peanut oil, Penicillins, Sulfa antibiotics, and Sulfamethoxazole-trimethoprim  Review of Systems   Review of Systems  Constitutional: Negative for chills and fever.  HENT: Negative for congestion and rhinorrhea.   Respiratory: Negative for cough and shortness of breath.   Cardiovascular: Negative for chest pain and palpitations.  Gastrointestinal: Negative for diarrhea, nausea and vomiting.  Genitourinary: Negative for difficulty urinating and dysuria.  Musculoskeletal: Positive for arthralgias. Negative for back pain.  Skin: Negative for rash and wound.  Neurological: Negative for light-headedness and headaches.  Psychiatric/Behavioral: Positive for behavioral problems and confusion.    Physical Exam Updated Vital Signs BP 132/70 (BP Location: Right Arm)   Pulse 87   Temp 98.1 F (36.7 C) (Oral)   Resp 18    SpO2 97%   Physical Exam Vitals and nursing note reviewed. Exam conducted with a chaperone present.  Constitutional:      General: She is not in acute distress.    Appearance: Normal appearance.  HENT:     Head: Normocephalic and atraumatic.     Nose: No rhinorrhea.  Eyes:     General:        Right eye: No discharge.        Left eye: No discharge.     Conjunctiva/sclera: Conjunctivae normal.  Cardiovascular:     Rate and Rhythm: Normal rate and regular rhythm.  Pulmonary:     Effort: Pulmonary effort is normal. No respiratory distress.     Breath sounds: No stridor.  Abdominal:     General: Abdomen is flat. There is no distension.     Palpations: Abdomen is soft.  Musculoskeletal:        General: Tenderness present. No signs of injury.     Comments: Tenderness palpation of the entire knee however no deformity no crepitus no swelling no  erythema  Skin:    General: Skin is warm and dry.  Neurological:     General: No focal deficit present.     Mental Status: She is alert. Mental status is at baseline. She is disoriented.     Motor: No weakness.     Comments: Full strength in all extremities sensation intact throughout no facial droop no abnormal cranial nerves otherwise good finger-to-nose coordination, disoriented with regards the year but knows person place and surroundings.  Psychiatric:        Mood and Affect: Mood normal.        Behavior: Behavior normal.     ED Results / Procedures / Treatments   Labs (all labs ordered are listed, but only abnormal results are displayed) Labs Reviewed  COMPREHENSIVE METABOLIC PANEL - Abnormal; Notable for the following components:      Result Value   Potassium 3.2 (*)    CO2 20 (*)    Glucose, Bld 158 (*)    Albumin 3.3 (*)    All other components within normal limits  CBC WITH DIFFERENTIAL/PLATELET - Abnormal; Notable for the following components:   RBC 3.78 (*)    Hemoglobin 9.2 (*)    HCT 31.0 (*)    MCH 24.3 (*)    MCHC  29.7 (*)    RDW 25.2 (*)    Platelets 539 (*)    Neutro Abs 8.7 (*)    All other components within normal limits  SALICYLATE LEVEL - Abnormal; Notable for the following components:   Salicylate Lvl <7.0 (*)    All other components within normal limits  ACETAMINOPHEN LEVEL - Abnormal; Notable for the following components:   Acetaminophen (Tylenol), Serum <10 (*)    All other components within normal limits  RESPIRATORY PANEL BY RT PCR (FLU A&B, COVID)  ETHANOL  RAPID URINE DRUG SCREEN, HOSP PERFORMED  URINALYSIS, ROUTINE W REFLEX MICROSCOPIC    EKG EKG Interpretation  Date/Time:  Tuesday August 20 2020 11:35:23 EDT Ventricular Rate:  85 PR Interval:    QRS Duration: 85 QT Interval:  353 QTC Calculation: 420 R Axis:   40 Text Interpretation: sinus rhythm, normal ecg Reconfirmed by Cherlynn PerchesKatz, Aldahir Litaker ([id number]) on 08/20/2020 1:02:49 PM   Radiology CT Head Wo Contrast  Result Date: 08/20/2020 CLINICAL DATA:  Altered mental status EXAM: CT HEAD WITHOUT CONTRAST TECHNIQUE: Contiguous axial images were obtained from the base of the skull through the vertex without intravenous contrast. COMPARISON:  None. FINDINGS: Brain: No evidence of acute infarction, hemorrhage, hydrocephalus, extra-axial collection or mass lesion/mass effect. Scattered low-density changes within the periventricular and subcortical white matter compatible with chronic microvascular ischemic change. Mild diffuse cerebral volume loss. Vascular: Atherosclerotic calcifications involving the large vessels of the skull base. No unexpected hyperdense vessel. Skull: Normal. Negative for fracture or focal lesion. Sinuses/Orbits: No acute finding. Other: None. IMPRESSION: 1. No acute intracranial findings. 2. Mild chronic microvascular ischemic change and cerebral volume loss. Electronically Signed   By: Duanne GuessNicholas  Plundo D.O.   On: 08/20/2020 17:20    Procedures Procedures (including critical care time)  Medications Ordered in  ED Medications - No data to display  ED Course  I have reviewed the triage vital signs and the nursing notes.  Pertinent labs & imaging results that were available during my care of the patient were reviewed by me and considered in my medical decision making (see chart for details).    MDM Rules/Calculators/A&P  Abnormal behavior, family comments this happens when she takes too much of her narcotic pain medication.  She intermittently admits to taking her narcotic medication in excess, this is not an attempt of self-harm this is to try and treat her chronic pain.  No signs of acute emergent exacerbation causing her chronic pain.  No recent infections or injuries.  Contrasted scan of her head will get basic labs to include coingestions.  Laboratory studies reviewed by myself show no significant derangements no significant concerning coingestions.  EKG reviewed by myself shows sinus rhythm with no acute ischemic change interval abnormality or arrhythmia.  This patient's laboratory studies again were unremarkable. She is having resolution of symptoms with time. Her husband states that she is nearly back to normal. She now knows the president is Duane Boston is able to describe more of what is going on. Her husband feels that if he had been home when she was checked on he would not have taken to the emergency room for behavior as he has seen this before related to her overuse of narcotic pain control. She is pending a CT scan to make sure there are no acute intracranial abnormalities, if this is negative she will be safe for discharge home with outpatient management. Husband agrees this plan and would prefer to leave as soon as they are clear. Patient agrees that she would prefer to leave as well. Her neurologic exam still shows no deficits.  Pt care was handed off to on coming provider at 1600.  Complete history and physical and current plan have been communicated.  Please refer  to their note for the remainder of ED care and ultimate disposition.  Pt seen in conjunction with Dr. Effie Shy   Final Clinical Impression(s) / ED Diagnoses Final diagnoses:  Altered mental status, unspecified altered mental status type    Rx / DC Orders ED Discharge Orders    None       Sabino Donovan, MD 08/20/20 1839

## 2020-08-20 NOTE — ED Triage Notes (Signed)
Pt presenting with AMS. Pt missed dr appointment this AM and doctor called to check up on her, she presenting with repetitive questioning, dr called EMS with concern for stroke. Pt VAN negative. No deficits besides aphasia. Per EMS pt's husband states that she presents this way when she has taken too much pain medicine. Pt initially denied this but admitted to to it with this RN, states she took 3 last  Night and 3 this am due to knee pain, denies any attempt at self harm/intentional overdose

## 2020-08-20 NOTE — ED Notes (Signed)
Pt seems agitated. Keeps asking to go pee. purewik was put in place however Pt took it out. Husband took pt to bathroom by wheelchair with assistance. Pt in bed comfortably

## 2020-08-21 ENCOUNTER — Other Ambulatory Visit: Payer: Self-pay | Admitting: Family Medicine

## 2020-08-21 ENCOUNTER — Other Ambulatory Visit (HOSPITAL_COMMUNITY): Payer: Self-pay | Admitting: Neurology

## 2020-08-21 DIAGNOSIS — R1084 Generalized abdominal pain: Secondary | ICD-10-CM

## 2020-08-21 MED FILL — tiZANidine HCL 4 MG TABS: 4 | 20 days supply | Qty: 60 | Fill #0

## 2020-08-22 ENCOUNTER — Ambulatory Visit (INDEPENDENT_AMBULATORY_CARE_PROVIDER_SITE_OTHER): Payer: 59 | Admitting: Orthopaedic Surgery

## 2020-08-22 ENCOUNTER — Other Ambulatory Visit: Payer: Self-pay

## 2020-08-22 ENCOUNTER — Telehealth: Payer: Self-pay

## 2020-08-22 ENCOUNTER — Encounter: Payer: Self-pay | Admitting: Orthopaedic Surgery

## 2020-08-22 ENCOUNTER — Other Ambulatory Visit: Payer: Self-pay | Admitting: Orthopedic Surgery

## 2020-08-22 VITALS — Ht 63.0 in | Wt 158.0 lb

## 2020-08-22 DIAGNOSIS — M25562 Pain in left knee: Secondary | ICD-10-CM | POA: Diagnosis not present

## 2020-08-22 MED ORDER — TRAMADOL HCL 50 MG PO TABS
50.0000 mg | ORAL_TABLET | Freq: Two times a day (BID) | ORAL | 0 refills | Status: DC | PRN
Start: 2020-08-22 — End: 2020-11-19

## 2020-08-22 MED ORDER — HYDROCODONE-ACETAMINOPHEN 5-325 MG PO TABS: 1.0000 | ORAL_TABLET | Freq: Three times a day (TID) | ORAL | 0 refills | Status: DC | PRN

## 2020-08-22 MED FILL — traMADol HCL 50 MG TABS: 50 | 5 days supply | Qty: 10 | Fill #0

## 2020-08-22 MED FILL — HYDROCODON-APAP 5-325: 5-325 | 5 days supply | Qty: 15 | Fill #0

## 2020-08-22 NOTE — Progress Notes (Signed)
Office Visit Note   Patient: Kelly Shannon           Date of Birth: 11/02/1951           MRN: [id number] Visit Date: 08/22/2020              Requested by: Mirian Mo, MD [address] [place],  [place] [address] PCP: Mirian Mo, MD   Assessment & Plan: Visit Diagnoses:  1. Left knee pain, unspecified chronicity     Plan:  #1: At this time we are going to plan on obtaining an MRI scan of the left knee to rule out internal derangement.  She had a cortisone injection which did help for couple of days but her pain has returned. #2: Follow back up after MRI scan for further delineation of treatment.  Follow-Up Instructions: No follow-ups on file.  Return after MRI scan  Orders:  No orders of the defined types were placed in this encounter.  No orders of the defined types were placed in this encounter.     Procedures: No procedures performed   Clinical Data: No additional findings.   Subjective: Chief Complaint  Patient presents with  . Left Knee - Follow-up, Pain   HPI Patient presents today for her left knee. She received a cortisone injection on 08/15/2020 and states that it only helped for a few days. Her pain has returned. She does not take anything for pain.   History is that she was seen by Dr. Cleophas Dunker on 15 August 2020.  At that time she was having a 1 week history of pain and discomfort.  She was having pain localized located more lateral to the knee.  There was also at that time some swelling in that area.  She grades her pain at that time as severe.  She has constant pain. Returns today for continued reevaluation.  She was told at her last visit that she could call and we would order an MRI scan.     Review of Systems  Constitutional: Negative for fatigue.  HENT: Negative for ear pain.   Eyes: Negative for pain.  Respiratory: Negative for shortness of breath.   Cardiovascular: Negative for leg swelling.  Gastrointestinal: Negative for  constipation and diarrhea.  Endocrine: Negative for cold intolerance and heat intolerance.  Genitourinary: Negative for difficulty urinating.  Musculoskeletal: Negative for joint swelling.  Skin: Negative for rash.  Allergic/Immunologic: Positive for food allergies.  Neurological: Negative for weakness.  Hematological: Bruises/bleeds easily.  Psychiatric/Behavioral: Negative for sleep disturbance.     Objective: Vital Signs: Ht 5\' 3"  (1.6 m)   Wt 158 lb (71.7 kg)   BMI 27.99 kg/m   Physical Exam Constitutional:      Appearance: Normal appearance. She is well-developed and normal weight.  HENT:     Head: Normocephalic.     Nose: Nose normal.  Eyes:     Pupils: Pupils are equal, round, and reactive to light.  Pulmonary:     Effort: Pulmonary effort is normal.  Skin:    General: Skin is warm and dry.  Neurological:     Mental Status: She is alert and oriented to person, place, and time.  Psychiatric:        Behavior: Behavior normal.     Ortho Exam  Exam today reveals some swelling along the lateral aspect of the knee.  She got good ligament stability.  I do not feel any crepitance with range of motion.  No ligamentous laxity with  varus or valgus stress.  No description of pain with straight leg raising.  Specialty Comments:  No specialty comments available.  Imaging: No results found.   PMFS History: Current Outpatient Medications  Medication Sig Dispense Refill  . albuterol (VENTOLIN HFA) 108 (90 Base) MCG/ACT inhaler INHALE 2 PUFFS BY MOUTH INTO THE LUNGS EVERY 4 HOURS AS NEEDED FOR WHEEZING OR SHORTNESS OF BREATH. 18 g 1  . atorvastatin (LIPITOR) 40 MG tablet TAKE 1 TABLET BY MOUTH DAILY. 90 tablet 3  . clonazePAM (KLONOPIN) 1 MG tablet Take 1 mg by mouth 2 (two) times daily as needed.    . dicyclomine (BENTYL) 10 MG capsule TAKE 1 CAPSULE BY MOUTH THREE TIMES DAILY AS NEEDED FOR SPASMS. 45 capsule 0  . DULoxetine (CYMBALTA) 30 MG capsule   4  . empagliflozin  (JARDIANCE) 10 MG TABS tablet Take 1 tablet (10 mg total) by mouth daily. 90 tablet 1  . ferrous sulfate (FERROUSUL) 325 (65 FE) MG tablet Take 1 tablet (325 mg total) by mouth daily with breakfast. 60 tablet 3  . fluticasone (FLONASE) 50 MCG/ACT nasal spray TWO SPRAYS EACH NOSTRIL ONCE A DAY FOR NASAL CONGESTION OR DRAINAGE 16 g 5  . furosemide (LASIX) 40 MG tablet Take 1 tablet (40 mg total) by mouth daily. 90 tablet 1  . gabapentin (NEURONTIN) 300 MG capsule Take by mouth.    Marland Kitchen glucose blood (FREESTYLE LITE) test strip USE TO CHECK BLOOD SUGAR DAILY IN THE MORNING 100 strip 11  . HYDROcodone-acetaminophen (NORCO/VICODIN) 5-325 MG tablet Take 1 tablet by mouth every 6 (six) hours as needed for moderate pain. 14 tablet 0  . LINZESS 72 MCG capsule TAKE 1 CAPSULE (72 MCG TOTAL) BY MOUTH DAILY BEFORE BREAKFAST. 90 capsule 2  . losartan (COZAAR) 25 MG tablet TAKE 1 TABLET BY MOUTH DAILY. 90 tablet 3  . metFORMIN (GLUCOPHAGE) 1000 MG tablet TAKE 1 TABLET BY MOUTH DAILY WITH A MEAL FOR 2 WEEKS, THEN INCREASE TO 2 TABLETS DAILY 180 tablet 2  . montelukast (SINGULAIR) 10 MG tablet TAKE 1 TABLET (10 MG TOTAL) BY MOUTH AT BEDTIME. 30 tablet 5  . tizanidine (ZANAFLEX) 6 MG capsule Take 6 mg by mouth 3 (three) times daily.  1  . topiramate (TOPAMAX) 25 MG tablet   3  . HYDROcodone-acetaminophen (NORCO/VICODIN) 5-325 MG tablet Take 1 tablet by mouth every 8 (eight) hours as needed for moderate pain or severe pain. 15 tablet 0   No current facility-administered medications for this visit.    Patient Active Problem List   Diagnosis Date Noted  . Pain in left knee 08/15/2020  . Generalized abdominal pain 02/20/2020  . Colon cancer screening 09/23/2018  . Mild persistent asthma 04/21/2016  . 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 Chronic pain associated with significant psychosocial dysfunction 12/19/2015  . 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 Diabetes mellitus (HCC) 12/19/2015    . Fibromyalgia 12/19/2015  . HLD (hyperlipidemia) 12/19/2015  . Hypertension 12/19/2015  . Cannot sleep 12/19/2015  . Lumbar radiculopathy 12/19/2015  . Polypharmacy 11/29/2015  . Impaired renal function 11/29/2015  . Chronic ethmoidal sinusitis 10/01/2015  . Antritis chronic 09/18/2015  . Anemia 01/18/2015  . 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 Cardiac murmur 03/08/2012  . Chronic neck pain 02/10/2012  . Chronic elbow pain 02/10/2012  . Bilateral chronic knee pain 02/10/2012  . Chronic hand pain 02/10/2012  . Chronic ankle pain 02/10/2012  . Chronic pain 02/10/2012  .  Allergic rhinitis 09/22/2011  . Bipolar affective disorder (HCC) 09/22/2011  . Hot flash, menopausal 09/22/2011  . Menopausal and perimenopausal disorder 09/15/2011  . Bipolar affective disorder, depressed, severe, with psychotic behavior (HCC) 08/11/2011  . Type 2 diabetes mellitus (HCC) 08/11/2011  . Hypercholesterolemia 08/11/2011  . Constipation 02/16/2010  . Disturbance in sleep behavior 06/16/2009   Past Medical History:  Diagnosis Date  . Asthma   . Diabetes mellitus   . Hyperlipidemia   . Hypertension     Family History  Problem Relation Age of Onset  . Diabetes Mother   . Heart disease Mother   . Heart disease Father     Past Surgical History:  Procedure Laterality Date  . ABDOMINAL HYSTERECTOMY     Social History   Occupational History  . Not on file  Tobacco Use  . Smoking status: Never Smoker  . Smokeless tobacco: Never Used  Substance and Sexual Activity  . Alcohol use: No  . Drug use: No  . Sexual activity: Yes

## 2020-08-22 NOTE — Telephone Encounter (Signed)
Spoke with patient.

## 2020-08-22 NOTE — Addendum Note (Signed)
Addended by: Wendi Maya on: 08/22/2020 03:11 PM   Modules accepted: Orders

## 2020-08-22 NOTE — Telephone Encounter (Signed)
Patient called she stated Dr.Whitfield was supposed to call in tramadol instead he called in hydrocodone she wants the correct prescription to be sent in and would a call back when prescription has been sent.call back:[phone]

## 2020-08-23 ENCOUNTER — Other Ambulatory Visit: Payer: Self-pay | Admitting: Family Medicine

## 2020-08-23 MED FILL — DICYCLOMINE 10 MG CAPSULE: 10 | 15 days supply | Qty: 45 | Fill #0

## 2020-08-23 NOTE — Telephone Encounter (Signed)
I have been refilling this medication far more than I feel comfortable.  Please have Ms. All come into the clinic in the next month or 2 for further discussion about her stomach cramping.

## 2020-08-26 ENCOUNTER — Other Ambulatory Visit: Payer: Self-pay | Admitting: Internal Medicine

## 2020-08-26 ENCOUNTER — Other Ambulatory Visit (HOSPITAL_COMMUNITY): Payer: Self-pay | Admitting: Nurse Practitioner

## 2020-08-26 DIAGNOSIS — F429 Obsessive-compulsive disorder, unspecified: Secondary | ICD-10-CM | POA: Diagnosis not present

## 2020-08-26 DIAGNOSIS — F411 Generalized anxiety disorder: Secondary | ICD-10-CM | POA: Diagnosis not present

## 2020-08-26 DIAGNOSIS — F319 Bipolar disorder, unspecified: Secondary | ICD-10-CM | POA: Diagnosis not present

## 2020-08-26 MED FILL — clonazePAM 0.5 MG TABS: 0.5 | 30 days supply | Qty: 60 | Fill #0

## 2020-08-26 MED FILL — QUETIAPINE FUMARATE 400 MG: 400 | 90 days supply | Qty: 90 | Fill #0

## 2020-08-26 MED FILL — MONTELUKAST SOD 10 MG TAB: 10 | 30 days supply | Qty: 30 | Fill #2

## 2020-08-26 MED FILL — ESZOPICLONE 2 MG TAB: 2 | 30 days supply | Qty: 30 | Fill #2

## 2020-08-26 MED FILL — FLUoxetine HCL 20 MG CAPS: 20 | 90 days supply | Qty: 90 | Fill #0

## 2020-09-03 ENCOUNTER — Other Ambulatory Visit (HOSPITAL_COMMUNITY): Payer: Self-pay | Admitting: Nurse Practitioner

## 2020-09-03 MED FILL — carBAMazepine 100 MG CHEW: 100 | 14 days supply | Qty: 42 | Fill #0

## 2020-09-05 ENCOUNTER — Other Ambulatory Visit: Payer: Self-pay

## 2020-09-05 ENCOUNTER — Ambulatory Visit
Admission: RE | Admit: 2020-09-05 | Discharge: 2020-09-05 | Disposition: A | Discharge status: Home / Self Care | Payer: 59 | Source: Ambulatory Visit | Attending: Orthopaedic Surgery | Admitting: Orthopaedic Surgery

## 2020-09-05 DIAGNOSIS — S83282A Other tear of lateral meniscus, current injury, left knee, initial encounter: Secondary | ICD-10-CM | POA: Diagnosis not present

## 2020-09-05 DIAGNOSIS — M25562 Pain in left knee: Secondary | ICD-10-CM

## 2020-09-06 ENCOUNTER — Other Ambulatory Visit (HOSPITAL_COMMUNITY): Payer: Self-pay | Admitting: Neurology

## 2020-09-06 MED FILL — GABAPENTIN 400 MG CAPSULE: 400 | 30 days supply | Qty: 120 | Fill #0

## 2020-09-09 ENCOUNTER — Other Ambulatory Visit (HOSPITAL_COMMUNITY): Payer: Self-pay | Admitting: Neurology

## 2020-09-09 MED FILL — GABAPENTIN 400 MG CAPSULE: 400 | 30 days supply | Qty: 120 | Fill #0

## 2020-09-11 ENCOUNTER — Other Ambulatory Visit: Payer: Self-pay

## 2020-09-11 ENCOUNTER — Other Ambulatory Visit: Payer: Self-pay | Admitting: Orthopaedic Surgery

## 2020-09-11 ENCOUNTER — Encounter: Payer: Self-pay | Admitting: Orthopaedic Surgery

## 2020-09-11 ENCOUNTER — Ambulatory Visit (INDEPENDENT_AMBULATORY_CARE_PROVIDER_SITE_OTHER): Payer: 59 | Admitting: Orthopaedic Surgery

## 2020-09-11 VITALS — Ht 63.0 in | Wt 158.0 lb

## 2020-09-11 DIAGNOSIS — M25562 Pain in left knee: Secondary | ICD-10-CM

## 2020-09-11 DIAGNOSIS — G8929 Other chronic pain: Secondary | ICD-10-CM

## 2020-09-11 DIAGNOSIS — M25561 Pain in right knee: Secondary | ICD-10-CM | POA: Diagnosis not present

## 2020-09-11 MED ORDER — DICLOFENAC SODIUM 1 % EX GEL: 4.0000 g | Freq: Four times a day (QID) | CUTANEOUS | 0 refills | Status: DC

## 2020-09-11 MED FILL — DICLOFENAC SODIUM 1 % GEL: 1 | 6 days supply | Qty: 100 | Fill #0

## 2020-09-11 NOTE — Progress Notes (Signed)
Office Visit Note   Patient: Kelly Shannon           Date of Birth: 12-01-1950           MRN: [id number] Visit Date: 09/11/2020              Requested by: Mirian Mo, MD [address] [place],  [place] [address] PCP: Mirian Mo, MD   Assessment & Plan: Visit Diagnoses:  1. Acute pain of left knee   2. Bilateral chronic knee pain     Plan: Mrs. Razavi is accompanied by her husband and here for follow-up evaluation of her left knee pain. She had an MRI scan that demonstrates fraying of the posterior horn of the medial meniscus towards the free edge. I'm not sure that she is symptomatic medially. She had a small radial tear of the free edge and body of the lateral meniscus and a horizontal tear of the posterior horn and the body of the junction of the lateral meniscus extending the superior articular surface. She is having some lateral joint pain. She is also a little bit of pain anteriorly and there was evidence of a small amount of fluid in the deep infrapatellar bursa. Long discussion with both she and her husband discussing different treatment options including therapy, bracing and time. I even discussed arthroscopy specifically to address the tear of the lateral meniscus. After much discussion she'd like to proceed with the arthroscopy. I have discussed the surgery in detail regarding the incisions and what she may expect postoperatively. I have some concern that that she may have less than complete relief of her pain told her so. She is having some anterior knee pain and some nondescript pain medially and laterally but having more trouble laterally particularly at the end of the day. That might be related to the meniscal tear. There was no evidence of any arthritis. We will proceed as an outpatient. We discussed what she might expect with the need for crutches and therapy and and pain  Follow-Up Instructions: Return We'll schedule left knee arthroscopy.   Orders:  Orders Placed This  Encounter  Procedures   Ambulatory referral to Physical Therapy   Meds ordered this encounter  Medications   diclofenac Sodium (VOLTAREN) 1 % GEL    Sig: Apply 4 g topically 4 (four) times daily.    Dispense:  100 g    Refill:  0      Procedures: No procedures performed   Clinical Data: No additional findings.   Subjective: Chief Complaint  Patient presents with   Left Knee - Follow-up    MRI review  Patient presents today for follow up on her left knee. She had an MRI performed on 09/05/2020. She is here to get those results today. No changes since her last visit.   HPI  Review of Systems   Objective: Vital Signs: Ht 5\' 3"  (1.6 m)    Wt 158 lb (71.7 kg)    BMI 27.99 kg/m   Physical Exam Constitutional:      Appearance: She is well-developed.  Eyes:     Pupils: Pupils are equal, round, and reactive to light.  Pulmonary:     Effort: Pulmonary effort is normal.  Skin:    General: Skin is warm and dry.  Neurological:     Mental Status: She is alert and oriented to person, place, and time.  Psychiatric:        Behavior: Behavior normal.     Ortho  Exam left knee was not hot red warm or swollen. She did have some discomfort minimally beneath the patella and in the prepatellar region but no skin changes or edema. She had pain out of proportion to my exam with some discomfort of both along the medial and lateral compartments. No popping or catching. No patella crepitation no evidence of any instability with either the ACL PCL or collateral ligaments. Painless range of motion of her left hip. No popliteal pain or mass. No calf pain. No distal edema. Neurologically intact  Specialty Comments:  No specialty comments available.  Imaging: No results found.   PMFS History: Patient Active Problem List   Diagnosis Date Noted   Pain in left knee 08/15/2020   Generalized abdominal pain 02/20/2020   Colon cancer screening 09/23/2018   Mild persistent asthma  04/21/2016   Other intervertebral disc displacement, lumbar region 12/19/2015   Chronic pain associated with significant psychosocial dysfunction 12/19/2015   Atypical migraine 12/19/2015   Diabetes mellitus (HCC) 12/19/2015   Fibromyalgia 12/19/2015   HLD (hyperlipidemia) 12/19/2015   Hypertension 12/19/2015   Cannot sleep 12/19/2015   Lumbar radiculopathy 12/19/2015   Polypharmacy 11/29/2015   Impaired renal function 11/29/2015   Chronic ethmoidal sinusitis 10/01/2015   Antritis chronic 09/18/2015   Anemia 01/18/2015   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   Cardiac murmur 03/08/2012   Chronic neck pain 02/10/2012   Chronic elbow pain 02/10/2012   Bilateral chronic knee pain 02/10/2012   Chronic hand pain 02/10/2012   Chronic ankle pain 02/10/2012   Chronic pain 02/10/2012   Allergic rhinitis 09/22/2011   Bipolar affective disorder (HCC) 09/22/2011   Hot flash, menopausal 09/22/2011   Menopausal and perimenopausal disorder 09/15/2011   Bipolar affective disorder, depressed, severe, with psychotic behavior (HCC) 08/11/2011   Type 2 diabetes mellitus (HCC) 08/11/2011   Hypercholesterolemia 08/11/2011   Constipation 02/16/2010   Disturbance in sleep behavior 06/16/2009   Past Medical History:  Diagnosis Date   Asthma    Diabetes mellitus    Hyperlipidemia    Hypertension     Family History  Problem Relation Age of Onset   Diabetes Mother    Heart disease Mother    Heart disease Father     Past Surgical History:  Procedure Laterality Date   ABDOMINAL HYSTERECTOMY     Social History   Occupational History   Not on file  Tobacco Use   Smoking status: Never Smoker   Smokeless tobacco: Never Used  Substance and Sexual Activity   Alcohol use: No   Drug use: No   Sexual activity: Yes

## 2020-09-12 MED FILL — BACLOFEN 10 MG TABS: 10 | 30 days supply | Qty: 60 | Fill #2

## 2020-09-12 MED FILL — RIZATRIPTAN BENZOATE 10 MG: 10 | 30 days supply | Qty: 8 | Fill #2

## 2020-09-13 ENCOUNTER — Encounter (INDEPENDENT_AMBULATORY_CARE_PROVIDER_SITE_OTHER): Payer: Self-pay

## 2020-09-13 ENCOUNTER — Other Ambulatory Visit: Payer: 59

## 2020-09-17 MED FILL — DULoxetine HCL 60 MG CPEP: 60 | 90 days supply | Qty: 180 | Fill #0

## 2020-09-19 ENCOUNTER — Other Ambulatory Visit: Payer: Self-pay | Admitting: Orthopedic Surgery

## 2020-09-19 DIAGNOSIS — M794 Hypertrophy of (infrapatellar) fat pad: Secondary | ICD-10-CM | POA: Diagnosis not present

## 2020-09-19 DIAGNOSIS — S83282A Other tear of lateral meniscus, current injury, left knee, initial encounter: Secondary | ICD-10-CM | POA: Diagnosis not present

## 2020-09-19 DIAGNOSIS — G8918 Other acute postprocedural pain: Secondary | ICD-10-CM | POA: Diagnosis not present

## 2020-09-19 DIAGNOSIS — M6752 Plica syndrome, left knee: Secondary | ICD-10-CM | POA: Diagnosis not present

## 2020-09-19 DIAGNOSIS — S83512A Sprain of anterior cruciate ligament of left knee, initial encounter: Secondary | ICD-10-CM | POA: Diagnosis not present

## 2020-09-19 MED ORDER — OXYCODONE HCL 5 MG PO TABS
5.0000 mg | ORAL_TABLET | ORAL | 0 refills | Status: DC | PRN
Start: 2020-09-19 — End: 2020-11-19

## 2020-09-19 MED FILL — oxyCODONE HCL 5 MG TABS: 5 | 5 days supply | Qty: 30 | Fill #0

## 2020-09-20 ENCOUNTER — Telehealth: Payer: Self-pay | Admitting: Orthopaedic Surgery

## 2020-09-20 NOTE — Telephone Encounter (Signed)
Patient called requesting a call back. Patient wants to know if she has to keep her ace bandage on from surgery or can she take it off before her schedule appt. Please call patient at [phone].

## 2020-09-20 NOTE — Telephone Encounter (Signed)
Please advise 

## 2020-09-21 NOTE — Telephone Encounter (Signed)
Called and discussed

## 2020-09-24 ENCOUNTER — Ambulatory Visit (INDEPENDENT_AMBULATORY_CARE_PROVIDER_SITE_OTHER): Payer: 59 | Admitting: Orthopaedic Surgery

## 2020-09-24 ENCOUNTER — Other Ambulatory Visit: Payer: Self-pay

## 2020-09-24 ENCOUNTER — Encounter: Payer: Self-pay | Admitting: Orthopaedic Surgery

## 2020-09-24 DIAGNOSIS — M25562 Pain in left knee: Secondary | ICD-10-CM

## 2020-09-24 DIAGNOSIS — G8929 Other chronic pain: Secondary | ICD-10-CM

## 2020-09-24 MED FILL — clonazePAM 0.5 MG TABS: 0.5 | 30 days supply | Qty: 60 | Fill #0

## 2020-09-24 MED FILL — MONTELUKAST SOD 10 MG TAB: 10 | 30 days supply | Qty: 30 | Fill #3

## 2020-09-24 NOTE — Progress Notes (Signed)
Office Visit Note   Patient: Kelly Shannon           Date of Birth: Nov 26, 1950           MRN: [id number] Visit Date: 09/24/2020              Requested by: Mirian Mo, MD [address] [place],  [place] [address] PCP: Mirian Mo, MD   Assessment & Plan: Visit Diagnoses:  1. Chronic pain of left knee     Plan: 5 days status post left knee arthroscopy with partial lateral meniscectomy and debridement of a large fat pad and plica.  Mrs. Mervin relates that she does not have any the pain she had preoperatively and is doing well.  No fever or chills.  No evidence of calf pain or infection.  Applied waterproof Band-Aids to the incisions and let pain dictate activity.  Office 2 weeks  Follow-Up Instructions: Return in about 2 weeks (around 10/08/2020).   Orders:  No orders of the defined types were placed in this encounter.  No orders of the defined types were placed in this encounter.     Procedures: No procedures performed   Clinical Data: No additional findings.   Subjective: Chief Complaint  Patient presents with  . Left Knee - Routine Post Op  5 days status post left knee arthroscopy with a partial lateral meniscectomy, debridement of a large fat pad extending along the lateral joint and please correct to me.  Minimal degenerative changes.  Doing well.  No related fever chills, calf pain or lower extremity swelling.  HPI  Review of Systems   Objective: Vital Signs: There were no vitals taken for this visit.  Physical Exam  Ortho Exam left knee arthroscopic portals healing without problem.  No erythema or pain.  Minimal effusion.  No popliteal pain.  Full knee extension flexed over 100 degrees.  Specialty Comments:  No specialty comments available.  Imaging: No results found.   PMFS History: Patient Active Problem List   Diagnosis Date Noted  . Pain in left knee 08/15/2020  . Generalized abdominal pain 02/20/2020  . Colon cancer screening  09/23/2018  . Mild persistent asthma 04/21/2016  . 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 Chronic pain associated with significant psychosocial dysfunction 12/19/2015  . 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 Diabetes mellitus (HCC) 12/19/2015  . Fibromyalgia 12/19/2015  . HLD (hyperlipidemia) 12/19/2015  . Hypertension 12/19/2015  . Cannot sleep 12/19/2015  . Lumbar radiculopathy 12/19/2015  . Polypharmacy 11/29/2015  . Impaired renal function 11/29/2015  . Chronic ethmoidal sinusitis 10/01/2015  . Antritis chronic 09/18/2015  . Anemia 01/18/2015  . 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 Cardiac murmur 03/08/2012  . Chronic neck pain 02/10/2012  . Chronic elbow pain 02/10/2012  . Bilateral chronic knee pain 02/10/2012  . Chronic hand pain 02/10/2012  . Chronic ankle pain 02/10/2012  . Chronic pain 02/10/2012  . Allergic rhinitis 09/22/2011  . Bipolar affective disorder (HCC) 09/22/2011  . Hot flash, menopausal 09/22/2011  . Menopausal and perimenopausal disorder 09/15/2011  . Bipolar affective disorder, depressed, severe, with psychotic behavior (HCC) 08/11/2011  . Type 2 diabetes mellitus (HCC) 08/11/2011  . Hypercholesterolemia 08/11/2011  . Constipation 02/16/2010  . Disturbance in sleep behavior 06/16/2009   Past Medical History:  Diagnosis Date  . Asthma   . Diabetes mellitus   . Hyperlipidemia   . Hypertension     Family History  Problem Relation Age of  Onset  . Diabetes Mother   . Heart disease Mother   . Heart disease Father     Past Surgical History:  Procedure Laterality Date  . ABDOMINAL HYSTERECTOMY     Social History   Occupational History  . Not on file  Tobacco Use  . Smoking status: Never Smoker  . Smokeless tobacco: Never Used  Substance and Sexual Activity  . Alcohol use: No  . Drug use: No  . Sexual activity: Yes     Valeria Batman, MD   Note - This record has been  created using [employer].  Chart creation errors have been sought, but may not always  have been located. Such creation errors do not reflect on  the standard of medical care.

## 2020-09-30 ENCOUNTER — Other Ambulatory Visit: Payer: Self-pay | Admitting: Family Medicine

## 2020-09-30 MED FILL — TOPIRAMATE 100 MG TABLET: 100 | 90 days supply | Qty: 270 | Fill #2

## 2020-10-01 ENCOUNTER — Other Ambulatory Visit: Payer: Self-pay | Admitting: Family Medicine

## 2020-10-01 MED FILL — METFORMIN HCL 1000 MG TABS: 1000 | 90 days supply | Qty: 180 | Fill #0

## 2020-10-03 ENCOUNTER — Encounter: Payer: Self-pay | Admitting: Family Medicine

## 2020-10-03 ENCOUNTER — Telehealth: Payer: Self-pay | Admitting: Family Medicine

## 2020-10-03 NOTE — Telephone Encounter (Signed)
Will forward to MD. Naleyah Ohlinger,CMA  

## 2020-10-03 NOTE — Telephone Encounter (Signed)
Patient requesting referrals for pain management and psychology. Offered appt to discuss with Dr. Homero Fellers but didn't want to set up at this time.

## 2020-10-03 NOTE — Telephone Encounter (Signed)
I have looked through the chart of Ms. Kelly Shannon.  I would like her to be seen in clinic before putting in any referral to pain management.  I will also send her the standard list of therapists in the community so she can reach out on her own to a psychologist.  Mirian Mo, MD

## 2020-10-04 NOTE — Telephone Encounter (Signed)
Appt made for 11-07-20.  Kelly Shannon,CMA

## 2020-10-07 MED FILL — GABAPENTIN 400 MG CAPSULE: 400 | 30 days supply | Qty: 120 | Fill #1

## 2020-10-07 MED FILL — ATORVASTATIN 40 MG TABLET: 40 | 90 days supply | Qty: 90 | Fill #2

## 2020-10-08 ENCOUNTER — Ambulatory Visit: Payer: 59 | Admitting: Orthopaedic Surgery

## 2020-10-10 MED FILL — BACLOFEN 10 MG TABS: 10 | 30 days supply | Qty: 60 | Fill #3

## 2020-10-10 MED FILL — NURTEC 75 MG TBDP: 75 | 30 days supply | Qty: 8 | Fill #0

## 2020-10-16 MED FILL — LINZESS 72 MCG CAPSULE: 72 | 90 days supply | Qty: 90 | Fill #1

## 2020-10-17 MED FILL — SHINGRIX 50 MCG SUS: 50 | 1 days supply | Qty: 1 | Fill #1

## 2020-10-22 ENCOUNTER — Other Ambulatory Visit: Payer: Self-pay

## 2020-10-22 ENCOUNTER — Ambulatory Visit (INDEPENDENT_AMBULATORY_CARE_PROVIDER_SITE_OTHER): Payer: 59 | Admitting: Orthopaedic Surgery

## 2020-10-22 ENCOUNTER — Encounter: Payer: Self-pay | Admitting: Orthopaedic Surgery

## 2020-10-22 ENCOUNTER — Other Ambulatory Visit (HOSPITAL_COMMUNITY): Payer: Self-pay | Admitting: Orthopedic Surgery

## 2020-10-22 VITALS — Ht 63.0 in | Wt 158.0 lb

## 2020-10-22 DIAGNOSIS — S83282S Other tear of lateral meniscus, current injury, left knee, sequela: Secondary | ICD-10-CM

## 2020-10-22 DIAGNOSIS — G8929 Other chronic pain: Secondary | ICD-10-CM

## 2020-10-22 DIAGNOSIS — M25562 Pain in left knee: Secondary | ICD-10-CM

## 2020-10-22 DIAGNOSIS — S83289A Other tear of lateral meniscus, current injury, unspecified knee, initial encounter: Secondary | ICD-10-CM | POA: Insufficient documentation

## 2020-10-22 MED ORDER — DICLOFENAC SODIUM 1 % EX GEL: 2.0000 g | Freq: Four times a day (QID) | CUTANEOUS | Status: DC | PRN

## 2020-10-22 MED FILL — MONTELUKAST SOD 10 MG TAB: 10 | 30 days supply | Qty: 30 | Fill #4

## 2020-10-22 NOTE — Progress Notes (Signed)
Office Visit Note   Patient: Kelly Shannon           Date of Birth: [birth year]           MRN: [id number] Visit Date: 10/22/2020              Requested by: Mirian Mo, MD [address] [place],  [place] [address] PCP: Mirian Mo, MD   Assessment & Plan: Visit Diagnoses:  1. Chronic pain of left knee   2. Other tear of lateral meniscus of left knee, unspecified whether old or current tear, sequela     Plan:  #1: We will have her start physical therapy for strengthening exercises. #2: Prescription for Voltaren gel was given as well as sent into [facility] pharmacy #3: Follow back up in 4 weeks for recheck   Follow-Up Instructions: Return in about 4 weeks (around 11/19/2020).   Orders:  Orders Placed This Encounter  Procedures  . Ambulatory referral to Physical Therapy   Meds ordered this encounter  Medications  . diclofenac Sodium (VOLTAREN) 1 % topical gel 2-4 g      Procedures: No procedures performed   Clinical Data: No additional findings.   Subjective: Chief Complaint  Patient presents with  . Left Knee - Follow-up    S/p left knee arthroscopy  Patient presents today for follow up on her left knee. She is now almost 5 weeks out from a left knee scope. She states that she is still having some pain. She is taking over the counter pain medicine as needed.  She is doing well overall by her history.  No major complaints.  HPI  Review of Systems   Objective: Vital Signs: Ht 5\' 3"  (1.6 m)   Wt 158 lb (71.7 kg)   BMI 27.99 kg/m   Physical Exam  Ortho Exam  Exam today reveals a trace effusion.  No warmth or erythema.  Good ligamentous stability.  She has smooth motion from full extension to about 105 degrees.  She does have some tenderness to palpation over the trochanteric bursal area.  Negative straight leg raise.  Specialty Comments:  No specialty comments available.  Imaging: No results found.   PMFS History: Current Outpatient Medications   Medication Sig Dispense Refill  . albuterol (VENTOLIN HFA) 108 (90 Base) MCG/ACT inhaler INHALE 2 PUFFS BY MOUTH INTO THE LUNGS EVERY 4 HOURS AS NEEDED FOR WHEEZING OR SHORTNESS OF BREATH. 18 g 1  . atorvastatin (LIPITOR) 40 MG tablet TAKE 1 TABLET BY MOUTH DAILY. 90 tablet 3  . clonazePAM (KLONOPIN) 1 MG tablet Take 1 mg by mouth 2 (two) times daily as needed.    . diclofenac Sodium (VOLTAREN) 1 % GEL Apply 4 g topically 4 (four) times daily. 100 g 0  . dicyclomine (BENTYL) 10 MG capsule TAKE 1 CAPSULE BY MOUTH 3 TIMES DAILY AS NEEDED FOR SPASMS. 45 capsule 0  . DULoxetine (CYMBALTA) 30 MG capsule   4  . empagliflozin (JARDIANCE) 10 MG TABS tablet Take 1 tablet (10 mg total) by mouth daily. 90 tablet 1  . ferrous sulfate (FERROUSUL) 325 (65 FE) MG tablet Take 1 tablet (325 mg total) by mouth daily with breakfast. 60 tablet 3  . fluticasone (FLONASE) 50 MCG/ACT nasal spray TWO SPRAYS EACH NOSTRIL ONCE A DAY FOR NASAL CONGESTION OR DRAINAGE 16 g 5  . furosemide (LASIX) 40 MG tablet Take 1 tablet (40 mg total) by mouth daily. 90 tablet 1  . gabapentin (NEURONTIN) 300 MG capsule Take  by mouth.    Marland Kitchen glucose blood (FREESTYLE LITE) test strip USE TO CHECK BLOOD SUGAR DAILY IN THE MORNING 100 strip 11  . HYDROcodone-acetaminophen (NORCO/VICODIN) 5-325 MG tablet Take 1 tablet by mouth every 8 (eight) hours as needed for moderate pain or severe pain. 15 tablet 0  . LINZESS 72 MCG capsule TAKE 1 CAPSULE (72 MCG TOTAL) BY MOUTH DAILY BEFORE BREAKFAST. 90 capsule 2  . losartan (COZAAR) 25 MG tablet TAKE 1 TABLET BY MOUTH DAILY. 90 tablet 3  . metFORMIN (GLUCOPHAGE) 1000 MG tablet TAKE 1 TABLET BY MOUTH DAILY WITH A MEAL FOR 2 WEEKS, THEN INCREASE TO 2 TABLETS DAILY 180 tablet 2  . montelukast (SINGULAIR) 10 MG tablet TAKE 1 TABLET (10 MG TOTAL) BY MOUTH AT BEDTIME. 30 tablet 5  . oxyCODONE (ROXICODONE) 5 MG immediate release tablet Take 1 tablet (5 mg total) by mouth every 4 (four) hours as needed for  moderate pain or severe pain. 30 tablet 0  . tizanidine (ZANAFLEX) 6 MG capsule Take 6 mg by mouth 3 (three) times daily.  1  . topiramate (TOPAMAX) 25 MG tablet   3  . traMADol (ULTRAM) 50 MG tablet Take 1 tablet (50 mg total) by mouth every 12 (twelve) hours as needed for moderate pain or severe pain. 10 tablet 0   Current Facility-Administered Medications  Medication Dose Route Frequency Provider Last Rate Last Admin  . diclofenac Sodium (VOLTAREN) 1 % topical gel 2-4 g  2-4 g Topical QID PRN Jacqualine Code D, PA-C       Fracture nondisplaced right space of the [facility] joint examination in 1 symptoms shoulder subluxation Current Outpatient Medications  Medication Sig Dispense Refill  . albuterol (VENTOLIN HFA) 108 (90 Base) MCG/ACT inhaler INHALE 2 PUFFS BY MOUTH INTO THE LUNGS EVERY 4 HOURS AS NEEDED FOR WHEEZING OR SHORTNESS OF BREATH. 18 g 1  . atorvastatin (LIPITOR) 40 MG tablet TAKE 1 TABLET BY MOUTH DAILY. 90 tablet 3  . clonazePAM (KLONOPIN) 1 MG tablet Take 1 mg by mouth 2 (two) times daily as needed.    . diclofenac Sodium (VOLTAREN) 1 % GEL Apply 4 g topically 4 (four) times daily. 100 g 0  . dicyclomine (BENTYL) 10 MG capsule TAKE 1 CAPSULE BY MOUTH 3 TIMES DAILY AS NEEDED FOR SPASMS. 45 capsule 0  . DULoxetine (CYMBALTA) 30 MG capsule   4  . empagliflozin (JARDIANCE) 10 MG TABS tablet Take 1 tablet (10 mg total) by mouth daily. 90 tablet 1  . ferrous sulfate (FERROUSUL) 325 (65 FE) MG tablet Take 1 tablet (325 mg total) by mouth daily with breakfast. 60 tablet 3  . fluticasone (FLONASE) 50 MCG/ACT nasal spray TWO SPRAYS EACH NOSTRIL ONCE A DAY FOR NASAL CONGESTION OR DRAINAGE 16 g 5  . furosemide (LASIX) 40 MG tablet Take 1 tablet (40 mg total) by mouth daily. 90 tablet 1  . gabapentin (NEURONTIN) 300 MG capsule Take by mouth.    Marland Kitchen glucose blood (FREESTYLE LITE) test strip USE TO CHECK BLOOD SUGAR DAILY IN THE MORNING 100 strip 11  . HYDROcodone-acetaminophen (NORCO/VICODIN) 5-325  MG tablet Take 1 tablet by mouth every 8 (eight) hours as needed for moderate pain or severe pain. 15 tablet 0  . LINZESS 72 MCG capsule TAKE 1 CAPSULE (72 MCG TOTAL) BY MOUTH DAILY BEFORE BREAKFAST. 90 capsule 2  . losartan (COZAAR) 25 MG tablet TAKE 1 TABLET BY MOUTH DAILY. 90 tablet 3  . metFORMIN (GLUCOPHAGE) 1000 MG tablet TAKE 1  TABLET BY MOUTH DAILY WITH A MEAL FOR 2 WEEKS, THEN INCREASE TO 2 TABLETS DAILY 180 tablet 2  . montelukast (SINGULAIR) 10 MG tablet TAKE 1 TABLET (10 MG TOTAL) BY MOUTH AT BEDTIME. 30 tablet 5  . oxyCODONE (ROXICODONE) 5 MG immediate release tablet Take 1 tablet (5 mg total) by mouth every 4 (four) hours as needed for moderate pain or severe pain. 30 tablet 0  . tizanidine (ZANAFLEX) 6 MG capsule Take 6 mg by mouth 3 (three) times daily.  1  . topiramate (TOPAMAX) 25 MG tablet   3  . traMADol (ULTRAM) 50 MG tablet Take 1 tablet (50 mg total) by mouth every 12 (twelve) hours as needed for moderate pain or severe pain. 10 tablet 0   Current Facility-Administered Medications  Medication Dose Route Frequency Provider Last Rate Last Admin  . diclofenac Sodium (VOLTAREN) 1 % topical gel 2-4 g  2-4 g Topical QID PRN Jetty Peeks, PA-C        Patient Active Problem List   Diagnosis Date Noted  . Lateral meniscus tear 10/22/2020  . Pain in left knee 08/15/2020  . Generalized abdominal pain 02/20/2020  . Colon cancer screening 09/23/2018  . Mild persistent asthma 04/21/2016  . 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 Chronic pain associated with significant psychosocial dysfunction 12/19/2015  . 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 Diabetes mellitus (HCC) 12/19/2015  . Fibromyalgia 12/19/2015  . HLD (hyperlipidemia) 12/19/2015  . Hypertension 12/19/2015  . Cannot sleep 12/19/2015  . Lumbar radiculopathy 12/19/2015  . Polypharmacy 11/29/2015  . Impaired renal function 11/29/2015  . Chronic ethmoidal sinusitis 10/01/2015  . Antritis  chronic 09/18/2015  . Anemia 01/18/2015  . 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 Cardiac murmur 03/08/2012  . Chronic neck pain 02/10/2012  . Chronic elbow pain 02/10/2012  . Bilateral chronic knee pain 02/10/2012  . Chronic hand pain 02/10/2012  . Chronic ankle pain 02/10/2012  . Chronic pain 02/10/2012  . Allergic rhinitis 09/22/2011  . Bipolar affective disorder (HCC) 09/22/2011  . Hot flash, menopausal 09/22/2011  . Menopausal and perimenopausal disorder 09/15/2011  . Bipolar affective disorder, depressed, severe, with psychotic behavior (HCC) 08/11/2011  . Type 2 diabetes mellitus (HCC) 08/11/2011  . Hypercholesterolemia 08/11/2011  . Constipation 02/16/2010  . Disturbance in sleep behavior 06/16/2009   Past Medical History:  Diagnosis Date  . Asthma   . Diabetes mellitus   . Hyperlipidemia   . Hypertension     Family History  Problem Relation Age of Onset  . Diabetes Mother   . Heart disease Mother   . Heart disease Father     Past Surgical History:  Procedure Laterality Date  . ABDOMINAL HYSTERECTOMY     Social History   Occupational History  . Not on file  Tobacco Use  . Smoking status: Never Smoker  . Smokeless tobacco: Never Used  Substance and Sexual Activity  . Alcohol use: No  . Drug use: No  . Sexual activity: Yes

## 2020-10-23 MED FILL — EMGALITY 120 MG/ML SOAJ: 120 | 30 days supply | Qty: 1 | Fill #1

## 2020-10-23 MED FILL — clonazePAM 0.5 MG TABS: 0.5 | 30 days supply | Qty: 60 | Fill #0

## 2020-10-24 MED FILL — DICLOFENAC SODIUM 1 % GEL: 1 | 13 days supply | Qty: 100 | Fill #0

## 2020-10-30 ENCOUNTER — Telehealth: Payer: Self-pay | Admitting: Orthopaedic Surgery

## 2020-10-30 NOTE — Telephone Encounter (Signed)
Patient called. Says there is a PT clinic close to her. She would like a referral faxed to them. [facility] [phone]

## 2020-10-31 NOTE — Telephone Encounter (Signed)
Message has been sent to referrals coordinator for this to be routed to patient's place of choice.

## 2020-11-05 ENCOUNTER — Other Ambulatory Visit: Payer: Self-pay | Admitting: Family Medicine

## 2020-11-05 MED FILL — GABAPENTIN 400 MG CAPSULE: 400 | 30 days supply | Qty: 120 | Fill #2

## 2020-11-06 ENCOUNTER — Other Ambulatory Visit: Payer: Self-pay | Admitting: Family Medicine

## 2020-11-06 MED FILL — JARDIANCE 10 MG TABLET: 10 | 90 days supply | Qty: 90 | Fill #0

## 2020-11-07 ENCOUNTER — Ambulatory Visit: Payer: 59 | Admitting: Family Medicine

## 2020-11-13 ENCOUNTER — Other Ambulatory Visit (HOSPITAL_COMMUNITY): Payer: Self-pay | Admitting: Neurology

## 2020-11-13 MED FILL — BACLOFEN 10 MG TABS: 10 | 30 days supply | Qty: 60 | Fill #0

## 2020-11-14 ENCOUNTER — Other Ambulatory Visit (HOSPITAL_COMMUNITY): Payer: Self-pay | Admitting: Optometry

## 2020-11-14 DIAGNOSIS — H52223 Regular astigmatism, bilateral: Secondary | ICD-10-CM | POA: Diagnosis not present

## 2020-11-14 DIAGNOSIS — H5211 Myopia, right eye: Secondary | ICD-10-CM | POA: Diagnosis not present

## 2020-11-14 DIAGNOSIS — H31011 Macula scars of posterior pole (postinflammatory) (post-traumatic), right eye: Secondary | ICD-10-CM | POA: Diagnosis not present

## 2020-11-15 MED FILL — XIIDRA 5 % SOLN: 5 | 90 days supply | Qty: 180 | Fill #0

## 2020-11-19 ENCOUNTER — Ambulatory Visit (INDEPENDENT_AMBULATORY_CARE_PROVIDER_SITE_OTHER): Payer: 59 | Admitting: Family Medicine

## 2020-11-19 ENCOUNTER — Encounter: Payer: Self-pay | Admitting: Orthopaedic Surgery

## 2020-11-19 ENCOUNTER — Ambulatory Visit (INDEPENDENT_AMBULATORY_CARE_PROVIDER_SITE_OTHER): Payer: 59 | Admitting: Orthopaedic Surgery

## 2020-11-19 ENCOUNTER — Other Ambulatory Visit: Payer: Self-pay | Admitting: Family Medicine

## 2020-11-19 ENCOUNTER — Other Ambulatory Visit: Payer: Self-pay

## 2020-11-19 VITALS — Ht 63.0 in | Wt 153.0 lb

## 2020-11-19 VITALS — BP 134/72 | HR 71 | Wt 147.2 lb

## 2020-11-19 DIAGNOSIS — M797 Fibromyalgia: Secondary | ICD-10-CM | POA: Diagnosis not present

## 2020-11-19 DIAGNOSIS — G8929 Other chronic pain: Secondary | ICD-10-CM

## 2020-11-19 DIAGNOSIS — F411 Generalized anxiety disorder: Secondary | ICD-10-CM | POA: Diagnosis not present

## 2020-11-19 DIAGNOSIS — F319 Bipolar disorder, unspecified: Secondary | ICD-10-CM | POA: Diagnosis not present

## 2020-11-19 DIAGNOSIS — Z1231 Encounter for screening mammogram for malignant neoplasm of breast: Secondary | ICD-10-CM

## 2020-11-19 DIAGNOSIS — M25562 Pain in left knee: Secondary | ICD-10-CM

## 2020-11-19 DIAGNOSIS — E119 Type 2 diabetes mellitus without complications: Secondary | ICD-10-CM | POA: Diagnosis not present

## 2020-11-19 DIAGNOSIS — E7800 Pure hypercholesterolemia, unspecified: Secondary | ICD-10-CM | POA: Diagnosis not present

## 2020-11-19 DIAGNOSIS — F429 Obsessive-compulsive disorder, unspecified: Secondary | ICD-10-CM | POA: Diagnosis not present

## 2020-11-19 DIAGNOSIS — Z1211 Encounter for screening for malignant neoplasm of colon: Secondary | ICD-10-CM

## 2020-11-19 LAB — POCT GLYCOSYLATED HEMOGLOBIN (HGB A1C): Hemoglobin A1C: 6.6 % — AB (ref 4.0–5.6)

## 2020-11-19 MED ORDER — TIZANIDINE HCL 6 MG PO CAPS: 6.0000 mg | ORAL_CAPSULE | Freq: Three times a day (TID) | ORAL | 1 refills | Status: DC

## 2020-11-19 MED ORDER — METFORMIN HCL ER 500 MG PO TB24: 1000.0000 mg | ORAL_TABLET | Freq: Two times a day (BID) | ORAL | 3 refills | Status: DC

## 2020-11-19 MED FILL — QUETIAPINE FUMARATE 400 MG: 400 | 90 days supply | Qty: 90 | Fill #0

## 2020-11-19 MED FILL — ESZOPICLONE 2 MG TAB: 2 | 30 days supply | Qty: 30 | Fill #0

## 2020-11-19 MED FILL — METFORMIN HCL ER 500 MG TB2: 500 | 45 days supply | Qty: 180 | Fill #0

## 2020-11-19 MED FILL — tiZANidine HCL 6 MG CAPS: 6 | 20 days supply | Qty: 60 | Fill #0

## 2020-11-19 MED FILL — FLUoxetine HCL 20 MG CAPS: 20 | 90 days supply | Qty: 90 | Fill #0

## 2020-11-19 NOTE — Assessment & Plan Note (Signed)
She is agreeable to coming to the family medicine clinic for further treatment for fibromyalgia and headaches. -Continue gabapentin -Continue tizanidine

## 2020-11-19 NOTE — Patient Instructions (Signed)
It was great to see you today.  Here is a quick review of the things we talked about:  Fibromyalgia: I would be happy to see and treat you for fibromyalgia and headaches.  It may be easier for you to get all of your care in one place.  There is nothing that your current medication regimen that is out of my depth.  I think it would be appropriate for you to continue coming here.  Diabetes: I have changed your metformin to the long-acting formulation which generally has fewer stomach side effects.  This may help your other GI problems.  Please remember to call your eye doctor to see if they do diabetic eye exams.  We recommend diabetics get eye exams once a year.  Please come back to clinic in 3 months.  Cancer screening: I have placed an order for you to get a mammogram.  You can call the breast center to set up an appointment for mammogram.  I have placed a referral to GI for colonoscopy.  You should get a call in the next 1-2 weeks to set up your appointment.  Please let me know if you have not received a call in the next 2 weeks.

## 2020-11-19 NOTE — Assessment & Plan Note (Signed)
Stable.  A1c 6.6 today.  She has not been taking her Jardiance due to yeast infections.  Based on her A1c today, there is no need to continue Jardiance.  Change of formulation of metformin to 1 with fewer GI side effects as this may be affecting her abdominal discomfort. -Metformin changed to XR formulation 1000 mg twice daily -DC Jardiance -Follow-up with ophthalmology for diabetic eye exam -Foot exam performed today

## 2020-11-19 NOTE — Progress Notes (Signed)
Office Visit Note   Patient: Kelly Shannon           Date of Birth: [birth year]           MRN: [id number] Visit Date: 11/19/2020              Requested by: Mirian Mo, MD [address] [place],  [place] [address] PCP: Mirian Mo, MD   Assessment & Plan: Visit Diagnoses:  1. Chronic pain of left knee     Plan: Nearly 9 weeks status post left knee arthroscopy with a partial medial and lateral meniscectomy.  Doing quite well without any complaints.  Excellent range of motion.  Wounds of healed without problem.  Encouraged her to work with her exercises a plan to see her back as needed  Follow-Up Instructions: Return if symptoms worsen or fail to improve.   Orders:  No orders of the defined types were placed in this encounter.  No orders of the defined types were placed in this encounter.     Procedures: No procedures performed   Clinical Data: No additional findings.   Subjective: Chief Complaint  Patient presents with  . Left Knee - Routine Post Op  No related problems.  No fever chills or significant pain.  Walks without ambulatory aid.  Very happy with the results  HPI  Review of Systems   Objective: Vital Signs: Ht 5\' 3"  (1.6 m)   Wt 153 lb (69.4 kg)   BMI 27.10 kg/m   Physical Exam  Ortho Exam left knee was not hot red warm or swollen.  Full quick extension and flexed over 105 degrees without instability.  No effusion.  No localized areas of tenderness.  No calf pain or thigh pain.  Motor exam intact  Specialty Comments:  No specialty comments available.  Imaging: No results found.   PMFS History: Patient Active Problem List   Diagnosis Date Noted  . Lateral meniscus tear 10/22/2020  . Pain in left knee 08/15/2020  . Generalized abdominal pain 02/20/2020  . Colon cancer screening 09/23/2018  . Mild persistent asthma 04/21/2016  . 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 Chronic pain associated with significant  psychosocial dysfunction 12/19/2015  . 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 Diabetes mellitus (HCC) 12/19/2015  . Fibromyalgia 12/19/2015  . HLD (hyperlipidemia) 12/19/2015  . Hypertension 12/19/2015  . Cannot sleep 12/19/2015  . Lumbar radiculopathy 12/19/2015  . Polypharmacy 11/29/2015  . Impaired renal function 11/29/2015  . Chronic ethmoidal sinusitis 10/01/2015  . Antritis chronic 09/18/2015  . Anemia 01/18/2015  . 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 Cardiac murmur 03/08/2012  . Chronic neck pain 02/10/2012  . Chronic elbow pain 02/10/2012  . Bilateral chronic knee pain 02/10/2012  . Chronic hand pain 02/10/2012  . Chronic ankle pain 02/10/2012  . Chronic pain 02/10/2012  . Allergic rhinitis 09/22/2011  . Bipolar affective disorder (HCC) 09/22/2011  . Hot flash, menopausal 09/22/2011  . Menopausal and perimenopausal disorder 09/15/2011  . Bipolar affective disorder, depressed, severe, with psychotic behavior (HCC) 08/11/2011  . Type 2 diabetes mellitus (HCC) 08/11/2011  . Hypercholesterolemia 08/11/2011  . Constipation 02/16/2010  . Disturbance in sleep behavior 06/16/2009   Past Medical History:  Diagnosis Date  . Asthma   . Diabetes mellitus   . Hyperlipidemia   . Hypertension     Family History  Problem Relation Age of Onset  . Diabetes Mother   . Heart disease Mother   .  Heart disease Father     Past Surgical History:  Procedure Laterality Date  . ABDOMINAL HYSTERECTOMY     Social History   Occupational History  . Not on file  Tobacco Use  . Smoking status: Never Smoker  . Smokeless tobacco: Never Used  Substance and Sexual Activity  . Alcohol use: No  . Drug use: No  . Sexual activity: Yes     Valeria Batman, MD   Note - This record has been created using [employer].  Chart creation errors have been sought, but may not always  have been located. Such creation errors do not reflect on  the standard  of medical care.

## 2020-11-19 NOTE — Progress Notes (Signed)
    SUBJECTIVE:   CHIEF COMPLAINT / HPI:   Diabetes Her current diabetes medication includes: -Metformin 2000 mg every morning -She does not take Jardiance due to significant yeast infections -She is not currently taking atorvastatin She has a history of her last abdominal pain for which she is previously taken Linzess and Bentyl.  This seems to ebb and flow and is worse when she eats bread products.  Fibromyalgia She has previously been treated by a chronic pain doctor.  She would like to be seen by a different pain doctor for her fibromyalgia and headaches.  Her current fibromyalgia medication includes gabapentin 300 mg.  Tizanidine 6 mg 3 times daily as needed.  She is willing to come to the family medicine clinic for management of this problem.   PERTINENT  PMH / PSH: Chronic pain, fibromyalgia bipolar disorder  OBJECTIVE:   BP 134/72   Pulse 71   Wt 147 lb 3.2 oz (66.8 kg)   SpO2 99%   BMI 26.08 kg/m   General: Alert and cooperative and appears to be in no acute distress Cardio: Normal S1 and S2, no S3 or S4. Rhythm is regular.   Pulm: Clear to auscultation bilaterally, no crackles, wheezing, or diminished breath sounds. Normal respiratory effort Abdomen: Bowel sounds normal. Abdomen soft and non-tender.  Extremities: No peripheral edema. Warm/ well perfused.  Strong radial pulses. Neuro: Cranial nerves grossly intact  Diabetic Foot Exam - Simple   Simple Foot Form Diabetic Foot exam was performed with the following findings: Yes 11/19/2020  3:52 PM  Visual Inspection See comments: Yes Sensation Testing Intact to touch and monofilament testing bilaterally: Yes Pulse Check Posterior Tibialis and Dorsalis pulse intact bilaterally: Yes Comments 0.5 cm calluses noted beneath her distal fifth MTP bilaterally.  Sensation intact to these areas.      ASSESSMENT/PLAN:   Type 2 diabetes mellitus (HCC) Stable.  A1c 6.6 today.  She has not been taking her Jardiance due to  yeast infections.  Based on her A1c today, there is no need to continue Jardiance.  Change of formulation of metformin to 1 with fewer GI side effects as this may be affecting her abdominal discomfort. -Metformin changed to XR formulation 1000 mg twice daily -DC Jardiance -Follow-up with ophthalmology for diabetic eye exam -Foot exam performed today  Hypercholesterolemia Not currently taking her atorvastatin.  We did not fully discuss this medication today. -Discuss further at subsequent visit in 1-2 months  Fibromyalgia She is agreeable to coming to the family medicine clinic for further treatment for fibromyalgia and headaches. -Continue gabapentin -Continue tizanidine   Healthcare maintenance -She was amenable to having a colonoscopy and mammogram. -Placed referral for colonoscopy -Ordered mammogram, contact information for breast center provided  Mirian Mo, MD [facility]

## 2020-11-19 NOTE — Assessment & Plan Note (Signed)
Not currently taking her atorvastatin.  We did not fully discuss this medication today. -Discuss further at subsequent visit in 1-2 months

## 2020-11-22 MED FILL — clonazePAM 0.5 MG TABS: 0.5 | 15 days supply | Qty: 30 | Fill #0

## 2020-11-22 MED FILL — EMGALITY 120 MG/ML SOAJ: 120 | 30 days supply | Qty: 1 | Fill #2

## 2020-11-25 MED FILL — MONTELUKAST SOD 10 MG TAB: 10 | 30 days supply | Qty: 30 | Fill #5

## 2020-11-27 ENCOUNTER — Telehealth: Payer: Self-pay

## 2020-11-27 NOTE — Telephone Encounter (Signed)
Patient calls nurse line requesting antibiotic for possible sinus infection. Patient reports frontal headache, post nasal drip, sore throat, and low grade fever. Patient denies body aches or any known covid exposures.   Advised patient that an appointment may be needed prior to starting abx. Offered to schedule patient in [facility] clinic this afternoon, however, patient declined at this time. Patient would like to avoid coming into the office if possible.   Please advise if rx can be sent in or if patient needs further evaluation.   To PCP  Veronda Prude, RN

## 2020-11-27 NOTE — Telephone Encounter (Signed)
Kelly Shannon,  I agree with your assessment, we do not generally prescribe antibiotics over the phone.  In this situation, it is very likely she may be suffering from a Covid infection which should be tested.  Please have her schedule at least a virtual visit for further assessment before antibiotics are prescribed.  Best, Mirian Mo, MD

## 2020-11-28 ENCOUNTER — Other Ambulatory Visit: Payer: Self-pay

## 2020-11-28 ENCOUNTER — Other Ambulatory Visit: Payer: Self-pay | Admitting: Family Medicine

## 2020-11-28 ENCOUNTER — Telehealth (INDEPENDENT_AMBULATORY_CARE_PROVIDER_SITE_OTHER): Payer: 59 | Admitting: Family Medicine

## 2020-11-28 DIAGNOSIS — J322 Chronic ethmoidal sinusitis: Secondary | ICD-10-CM | POA: Diagnosis not present

## 2020-11-28 MED ORDER — DOXYCYCLINE HYCLATE 100 MG PO TABS: 100.0000 mg | ORAL_TABLET | Freq: Two times a day (BID) | ORAL | 0 refills | Status: DC

## 2020-11-28 MED ORDER — FLUTICASONE PROPIONATE 50 MCG/ACT NA SUSP: 2.0000 | Freq: Every day | NASAL | 6 refills | Status: DC

## 2020-11-28 MED FILL — DOXYCYCLINE HYCLATE 100 MG: 100 | 7 days supply | Qty: 14 | Fill #0

## 2020-11-28 MED FILL — FLUTICASONE PROP 50 MCG SPR: 50 | 30 days supply | Qty: 16 | Fill #0

## 2020-11-28 NOTE — Progress Notes (Signed)
[facility] Telemedicine Visit  Patient consented to have virtual visit and was identified by name and date of birth. Method of visit: Telephone I tried to connect with patient via video but was unsuccessful after multiple attempts.  Encounter participants: Patient: Kelly Shannon - located at home Provider: Dana Allan - located at office Others (if applicable): n/a  Chief Complaint: headache,low grade fever and nasal drainage  HPI: Patient reports that she has had sinus infection for about 3 days.  She has had these in the past and symptoms are similar.  She reports that her forehead has been hurting and she has been having some postnasal drainage.  She reports a low-grade fever of 101 taken orally.  She did take some Tylenol today and her last recorded temperature was 98.7.  She denies any shortness of breath or difficulty breathing.  She does have a nonproductive cough which is not new and reports that this is from her asthma.  She has increased her albuterol for the past 3 days to 3 times a day, normally she uses it once every 3 or 4 days.  Denies any sick contacts, slurred speech or ear pain.  She is able to drink and eat without any difficulties.  Denies any weight loss, anosmia or ageusia.   ROS: per HPI  Pertinent PMHx:  Asthma  Exam:  Temp (!) 101 F (38.3 C) (Oral)   Wt 146 lb (66.2 kg) Comment: pt reports  BMI 25.86 kg/m   Respiratory: No increased work of breathing or shortness of breath could be heard during telephone conversation.  She was able to speak in full sentences without shortness of breath.  Assessment/Plan:  Chronic ethmoidal sinusitis System consistent with sinusitis.  She does have an elevated temperature of 101 treated with Tylenol down to 98.7.  This is now day 4 of symptoms.  Likely viral but given that I cannot see and do a complete examination cannot rule out bacterial infection. -We will try Flonase nasal spray twice  daily -We will order doxycycline 100 mg twice daily x7/7.  Discussed with patient to start medication if symptoms no better in a couple of days. -Strict return precautions -Encouraged  Covid testing -Follow-up with PCP as needed    Time spent during visit with patient: 15 minutes

## 2020-11-28 NOTE — Telephone Encounter (Signed)
Called patient and informed of below. Patient states that she lives 45 minutes away and does not feel like driving into the clinic. Patient scheduled virtually in [facility] this AM for further evaluation by provider.   Veronda Prude, RN

## 2020-12-01 ENCOUNTER — Encounter: Payer: Self-pay | Admitting: Family Medicine

## 2020-12-01 NOTE — Assessment & Plan Note (Signed)
System consistent with sinusitis.  She does have an elevated temperature of 101 treated with Tylenol down to 98.7.  This is now day 4 of symptoms.  Likely viral but given that I cannot see and do a complete examination cannot rule out bacterial infection. -We will try Flonase nasal spray twice daily -We will order doxycycline 100 mg twice daily x7/7.  Discussed with patient to start medication if symptoms no better in a couple of days. -Strict return precautions -Encouraged  Covid testing -Follow-up with PCP as needed

## 2020-12-03 MED FILL — GABAPENTIN 400 MG CAPSULE: 400 | 30 days supply | Qty: 120 | Fill #3

## 2020-12-09 ENCOUNTER — Other Ambulatory Visit: Payer: Self-pay | Admitting: Family Medicine

## 2020-12-09 ENCOUNTER — Telehealth: Payer: Self-pay

## 2020-12-09 MED ORDER — BACLOFEN 10 MG PO TABS: 10.0000 mg | ORAL_TABLET | Freq: Two times a day (BID) | ORAL | 0 refills | Status: DC

## 2020-12-09 MED FILL — BACLOFEN 10 MG TABS: 10 | 15 days supply | Qty: 30 | Fill #0

## 2020-12-09 NOTE — Telephone Encounter (Signed)
I was able to call and speak with Kelly Shannon about her pain medicine.  She has been taking baclofen 20 mg nightly.  She was given a 30 pill refill of her baclofen.  I have a follow-up appointment with her in clinic next week at which time we can have a longer discussion about the best regimen for her pain management.

## 2020-12-09 NOTE — Telephone Encounter (Signed)
Patient calls nurse line regarding baclofen refill. Patient reports that Dr. Homero Fellers is now managing her pain medicine, however, this has never been prescribed by our office.   Please advise if medication can be sent in. Patient does have appointment scheduled with PCP on 2/14.  Veronda Prude, RN

## 2020-12-11 MED FILL — tiZANidine HCL 6 MG CAPS: 6 | 20 days supply | Qty: 60 | Fill #1

## 2020-12-16 ENCOUNTER — Ambulatory Visit: Payer: 59 | Admitting: Family Medicine

## 2020-12-18 MED FILL — ESZOPICLONE 2 MG TAB: 2 | 30 days supply | Qty: 30 | Fill #1

## 2020-12-24 ENCOUNTER — Other Ambulatory Visit (HOSPITAL_COMMUNITY): Payer: Self-pay | Admitting: Nurse Practitioner

## 2020-12-25 ENCOUNTER — Other Ambulatory Visit: Payer: Self-pay | Admitting: Family Medicine

## 2020-12-25 MED FILL — clonazePAM 0.5 MG TABS: 0.5 | 3 days supply | Qty: 6 | Fill #0

## 2020-12-26 MED FILL — MONTELUKAST SOD 10 MG TAB: 10 | 30 days supply | Qty: 30 | Fill #0

## 2020-12-27 MED FILL — clonazePAM 0.5 MG TABS: 0.5 | 15 days supply | Qty: 30 | Fill #0

## 2020-12-30 ENCOUNTER — Encounter: Payer: Self-pay | Admitting: Family Medicine

## 2020-12-30 ENCOUNTER — Other Ambulatory Visit: Payer: Self-pay

## 2020-12-30 ENCOUNTER — Ambulatory Visit (INDEPENDENT_AMBULATORY_CARE_PROVIDER_SITE_OTHER): Payer: 59 | Admitting: Family Medicine

## 2020-12-30 ENCOUNTER — Other Ambulatory Visit: Payer: Self-pay | Admitting: Family Medicine

## 2020-12-30 VITALS — BP 120/62 | HR 67 | Ht 63.0 in | Wt 147.2 lb

## 2020-12-30 DIAGNOSIS — M797 Fibromyalgia: Secondary | ICD-10-CM | POA: Diagnosis not present

## 2020-12-30 DIAGNOSIS — D649 Anemia, unspecified: Secondary | ICD-10-CM | POA: Diagnosis not present

## 2020-12-30 MED ORDER — TIZANIDINE HCL 6 MG PO CAPS: 12.0000 mg | ORAL_CAPSULE | Freq: Every day | ORAL | 1 refills | Status: DC

## 2020-12-30 MED ORDER — BACLOFEN 10 MG PO TABS: 20.0000 mg | ORAL_TABLET | Freq: Every day | ORAL | 1 refills | Status: DC

## 2020-12-30 MED FILL — BACLOFEN 10 MG TABS: 10 | 60 days supply | Qty: 120 | Fill #0

## 2020-12-30 NOTE — Assessment & Plan Note (Signed)
Her individual medications and their side effects were reviewed.  She is aware that she has a high risk of experiencing side effects from these medications as she ages.  Her fibromyalgia seems to be well controlled at this point she is not interested in any medication changes.  We will continue her current regimen although it she may benefit from decreasing some of these medications in the future.  It would be my preference to first decrease and discontinue the tizanidine, if possible. -Gabapentin 800 twice daily -Tizanidine 12 mg daily -Baclofen 20 mg nightly If her fibromyalgia becomes poorly controlled in the future, it may be helpful to have her establish care with a therapist and/or refer to physical medicine and rehabilitation.

## 2020-12-30 NOTE — Assessment & Plan Note (Signed)
Hemoglobin was 9.24 months ago.  No obvious signs of uterine or rectal bleeding. -Follow-up CBC and iron studies -Placed another ambulatory referral to GI for a colonoscopy

## 2020-12-30 NOTE — Progress Notes (Signed)
SUBJECTIVE:   CHIEF COMPLAINT / HPI:   Fibromyalgia Ms. Hannis presents to clinic today to discuss her fibromyalgia.  This problem was previously treated by a pain medicine physician but she was interested in changing providers and was willing to be treated at the family medicine clinic for this issue.  She reports that she was first diagnosed with fibromyalgia about 20 years ago although she realizes she was suffering from the symptoms of fibromyalgia for years prior to that diagnosis.  At the time of her diagnosis, she reports that she was experiencing pain in 18 out of 18 different areas on her body.  She typically experiences pain in her legs, knees, elbows and across her shoulders and the back of her neck.  More recently, her pain seems to be isolated to her shoulders bilaterally in the back of her neck.  Throughout the past 20 years, she has tried a small variety of medication for fibromyalgia including narcotics.  Her current fibromyalgia medication includes: - baclofen 20 mg at night - Tizanidine 12 mg in the morning - gabapentin 800 mg BID She notes that her fibromyalgia seems to be well controlled on this medication regimen.  In addition to the above medication, she does try to stay active although that is difficult for her in the winter.  Her current physical activity is limited to walking her dog.  Due to the cold winter weather, this is primarily walking around her yard.  She is previously been encouraged to try water activities/swimming/water aerobics but is a poor swimmer and did not take to water activities.  She has previously considered seeing a therapist but is not currently seeing a therapist.  Anemia Recent lab work demonstrates a low hemoglobin.  She specifically denies vaginal bleeding or rectal bleeding.  She has previously been referred to GI for colonoscopy but did not respond to any voicemails left bile about her GI and so her referral was canceled.  PERTINENT  PMH  / PSH: Atypical migraines, bipolar affective disorder, diabetes, hypertension  OBJECTIVE:   BP 120/62   Pulse 67   Ht 5\' 3"  (1.6 m)   Wt 147 lb 4 oz (66.8 kg)   SpO2 99%   BMI 26.08 kg/m    General: Alert and cooperative and appears to be in no acute distress Cardio: Normal S1 and S2, no S3 or S4. Rhythm is regular. No murmurs or rubs.   Pulm: Clear to auscultation bilaterally, no crackles, wheezing, or diminished breath sounds. Normal respiratory effort Abdomen: Bowel sounds normal. Abdomen soft and non-tender.  Extremities: No peripheral edema. Warm/ well perfused.  Strong radial pulses. Neuro: Cranial nerves grossly intact  ASSESSMENT/PLAN:   Fibromyalgia Her individual medications and their side effects were reviewed.  She is aware that she has a high risk of experiencing side effects from these medications as she ages.  Her fibromyalgia seems to be well controlled at this point she is not interested in any medication changes.  We will continue her current regimen although it she may benefit from decreasing some of these medications in the future.  It would be my preference to first decrease and discontinue the tizanidine, if possible. -Gabapentin 800 twice daily -Tizanidine 12 mg daily -Baclofen 20 mg nightly If her fibromyalgia becomes poorly controlled in the future, it may be helpful to have her establish care with a therapist and/or refer to physical medicine and rehabilitation.  Anemia Hemoglobin was 9.24 months ago.  No obvious signs of uterine or  rectal bleeding. -Follow-up CBC and iron studies -Placed another ambulatory referral to GI for a colonoscopy   Follow-up in 3 months  Mirian Mo, MD [facility]

## 2020-12-30 NOTE — Patient Instructions (Signed)
It was great to see you today.  Here is a quick review of the things we talked about:   Fibromyalgia: Glad we could have a long talk about fibromyalgia today.  I think it is reasonable to continue current medications.  I think we have some good plans in place if your fibromyalgia symptoms do worsen.  Anemia: I like to get some blood work today to assess for causes of your low hemoglobin level.  I have placed a referral to GI for colonoscopy.  You should get a call in the next 1-2 weeks to set up your appointment.  Please let me know if you have not received a call in the next 2 weeks.  If all of your labs are normal, I will send you a message over my chart or send you a letter.  If there is anything to discuss, I will give you a phone call.

## 2020-12-31 ENCOUNTER — Other Ambulatory Visit (HOSPITAL_COMMUNITY): Payer: Self-pay | Admitting: Nurse Practitioner

## 2020-12-31 ENCOUNTER — Telehealth: Payer: Self-pay

## 2020-12-31 DIAGNOSIS — F411 Generalized anxiety disorder: Secondary | ICD-10-CM | POA: Diagnosis not present

## 2020-12-31 DIAGNOSIS — F319 Bipolar disorder, unspecified: Secondary | ICD-10-CM | POA: Diagnosis not present

## 2020-12-31 DIAGNOSIS — F429 Obsessive-compulsive disorder, unspecified: Secondary | ICD-10-CM | POA: Diagnosis not present

## 2020-12-31 LAB — CBC
Hematocrit: 29.5 % — ABNORMAL LOW (ref 34.0–46.6)
Hemoglobin: 9.2 g/dL — ABNORMAL LOW (ref 11.1–15.9)
MCH: 22.5 pg — ABNORMAL LOW (ref 26.6–33.0)
MCHC: 31.2 g/dL — ABNORMAL LOW (ref 31.5–35.7)
MCV: 72 fL — ABNORMAL LOW (ref 79–97)
Platelets: 370 10*3/uL (ref 150–450)
RBC: 4.09 x10E6/uL (ref 3.77–5.28)
RDW: 21.1 % — ABNORMAL HIGH (ref 11.7–15.4)
WBC: 5.8 10*3/uL (ref 3.4–10.8)

## 2020-12-31 LAB — IRON,TIBC AND FERRITIN PANEL
Ferritin: 6 ng/mL — ABNORMAL LOW (ref 15–150)
Iron Saturation: 14 % — ABNORMAL LOW (ref 15–55)
Iron: 48 ug/dL (ref 27–139)
Total Iron Binding Capacity: 351 ug/dL (ref 250–450)
UIBC: 303 ug/dL (ref 118–369)

## 2020-12-31 MED FILL — DULoxetine HCL 60 MG CPEP: 60 | 90 days supply | Qty: 180 | Fill #0

## 2020-12-31 NOTE — Telephone Encounter (Signed)
Pharmacy calls nurse line regarding clarification on tizanidine rx.   Pharmacist is requesting to change directions from Tizanidine two capsules daily to 1 tablet in the morning and 1 tablet in the afternoon. Pharmacist states that previous rx had medication spilit into TID.   Please advise if change is appropriate.   Veronda Prude, RN

## 2021-01-01 ENCOUNTER — Other Ambulatory Visit: Payer: Self-pay

## 2021-01-01 ENCOUNTER — Other Ambulatory Visit: Payer: Self-pay | Admitting: Family Medicine

## 2021-01-01 MED ORDER — FERROUS SULFATE 324 MG PO TBEC: 324.0000 mg | DELAYED_RELEASE_TABLET | ORAL | 0 refills | Status: DC

## 2021-01-01 MED ORDER — GABAPENTIN 400 MG PO CAPS: 800.0000 mg | ORAL_CAPSULE | Freq: Two times a day (BID) | ORAL | 3 refills | Status: DC

## 2021-01-01 MED ORDER — TOPIRAMATE 25 MG PO TABS: 25.0000 mg | ORAL_TABLET | Freq: Every day | ORAL | 3 refills | Status: DC

## 2021-01-01 MED FILL — tiZANidine HCL 6 MG CAPS: 6 | 60 days supply | Qty: 120 | Fill #0

## 2021-01-01 MED FILL — LINZESS 72 MCG CAPSULE: 72 | 90 days supply | Qty: 90 | Fill #2

## 2021-01-01 MED FILL — GABAPENTIN 400 MG CAPSULE: 400 | 30 days supply | Qty: 120 | Fill #0

## 2021-01-01 NOTE — Telephone Encounter (Signed)
Patient calls nurse line requesting a refill on Gabapentin and Topamax. Patient reports she discussed this with PCP at recent office visit. Will forward to PCP.

## 2021-01-01 NOTE — Progress Notes (Signed)
Mrs. Boxx was called and informed that her labs are consistent with iron deficiency anemia.  The first step in the work-up of iron deficiency anemia is to get a colonoscopy to assess for causes of GI bleeding.  She is currently getting set up for an appointment with GI for colonoscopy.  We also discussed starting oral iron supplements today.  She was advised to take an iron pill every other day to help restore her iron levels. Mirian Mo, MD

## 2021-01-02 MED FILL — TOPIRAMATE 25 MG TAB: 25 | 30 days supply | Qty: 30 | Fill #0

## 2021-01-03 MED FILL — METFORMIN HCL ER 500 MG TB2: 500 | 45 days supply | Qty: 180 | Fill #1

## 2021-01-09 ENCOUNTER — Telehealth: Payer: Self-pay

## 2021-01-09 NOTE — Telephone Encounter (Signed)
That is a medication that has a lot of side effects, especially when used in combination with her other medications.  I would prefer to use a different medication and it looks like she has not used that medicine any time recently based on her dispense reports. Please ask if she has been using tylenol or motrin and what other medications have worked well in the past.  Mirian Mo, MD

## 2021-01-09 NOTE — Telephone Encounter (Signed)
Patient calls nurse line requesting promethazine rx. Patient states that she needs this medication for nausea associated with migraines.   Medication is not on current med list.   Please advise.   Veronda Prude, RN

## 2021-01-10 NOTE — Telephone Encounter (Signed)
Returned call to patient. Patient denies having a current migraine. States that when she gets migraines she becomes very nauseous and would like to have anti emetic at home.   Patient is agreeable to provider recommendations for anti nausea medications.    Veronda Prude, RN

## 2021-01-10 NOTE — Telephone Encounter (Signed)
I will call pt for further clarification.

## 2021-01-13 ENCOUNTER — Telehealth: Payer: Self-pay

## 2021-01-15 ENCOUNTER — Ambulatory Visit: Payer: 59 | Admitting: Orthopaedic Surgery

## 2021-01-16 ENCOUNTER — Other Ambulatory Visit: Payer: Self-pay | Admitting: Family Medicine

## 2021-01-16 MED ORDER — TOPIRAMATE 100 MG PO TABS: 100.0000 mg | ORAL_TABLET | Freq: Two times a day (BID) | ORAL | 1 refills | Status: DC

## 2021-01-16 MED ORDER — PROCHLORPERAZINE MALEATE 5 MG PO TABS: 5.0000 mg | ORAL_TABLET | Freq: Four times a day (QID) | ORAL | 0 refills | Status: DC | PRN

## 2021-01-16 NOTE — Progress Notes (Signed)
Called Ms. Kelly Shannon to discuss her migraine medication.  She reports that she currently experiences migraines about 2 times/month with her present regimen.  She occasionally experiences nausea during these episodes for which she has taken promethazine in the past.  She also takes topiramate 100 mg in the am and 200 mg in the pm for migraine prophylaxis.    She was willing to try compazine in place of promethazine for her nausea and willing to trial a reduced dose of topiramte at 100 mg BID.  These medications were sent in to her pharmacy.  Mirian Mo, MD

## 2021-01-21 ENCOUNTER — Other Ambulatory Visit: Payer: Self-pay

## 2021-01-21 ENCOUNTER — Encounter: Payer: Self-pay | Admitting: Orthopaedic Surgery

## 2021-01-21 ENCOUNTER — Ambulatory Visit (INDEPENDENT_AMBULATORY_CARE_PROVIDER_SITE_OTHER): Payer: 59 | Admitting: Orthopaedic Surgery

## 2021-01-21 VITALS — Ht 63.0 in | Wt 147.0 lb

## 2021-01-21 DIAGNOSIS — M25562 Pain in left knee: Secondary | ICD-10-CM

## 2021-01-21 DIAGNOSIS — G8929 Other chronic pain: Secondary | ICD-10-CM | POA: Diagnosis not present

## 2021-01-21 NOTE — Progress Notes (Signed)
Office Visit Note   Patient: Kelly Shannon           Date of Birth: 1951/01/08           MRN: [id number] Visit Date: 01/21/2021              Requested by: Mirian Mo, MD [address] [place],  [place] [address] PCP: Mirian Mo, MD   Assessment & Plan: Visit Diagnoses:  1. Chronic pain of left knee     Plan: Kelly Shannon is accompanied by her husband and here for follow-up evaluation of her left knee.  She had arthroscopy in November of last year for partial medial lateral meniscectomy.  There was minimal arthritic change.  She notes that recently well increasing her activities and exercising she has had a little bit more pain.  Today's exam was relatively benign.  There may have been a very minimal effusion but no localized areas of tenderness and range of motion from 0 to at least 110 degrees without instability.  I think she just has overuse and may gradually increase her activities.  We talked about using ice or heat and over-the-counter medicines and appropriate dosages.  She might even want to try either CBD oil or Voltaren gel.  I think her issues will be short-lived given the minimal pathology and terms of arthritis from her surgery  Follow-Up Instructions: Return if symptoms worsen or fail to improve.   Orders:  No orders of the defined types were placed in this encounter.  No orders of the defined types were placed in this encounter.     Procedures: No procedures performed   Clinical Data: No additional findings.   Subjective: Chief Complaint  Patient presents with  . Left Knee - Pain  Patient presents today for her left knee pain. She states that she is having pain and swelling throughout her knee with any prolonged standing or walking. She had a previous knee scope in November of last year. . She is not taking anything additional for pain.  She just recently increased her activities and thinks that may have been partially responsible for pain.  She tried  over-the-counter medicines including Advil and Aleve and to some extent they help.  No numbness or tingling or proximal pain.  No recent history of injury or trauma.  Has history of fibromyalgia  HPI  Review of Systems   Objective: Vital Signs: Ht 5\' 3"  (1.6 m)   Wt 147 lb (66.7 kg)   BMI 26.04 kg/m   Physical Exam Constitutional:      Appearance: She is well-developed.  Eyes:     Pupils: Pupils are equal, round, and reactive to light.  Pulmonary:     Effort: Pulmonary effort is normal.  Skin:    General: Skin is warm and dry.  Neurological:     Mental Status: She is alert and oriented to person, place, and time.  Psychiatric:        Behavior: Behavior normal.     Ortho Exam very minimal effusion left knee.  The knee was not hot red or swollen.  No instability.  Full quick extension of flexed at least 110 degrees.  No opening with a varus valgus stress.  No popliteal pain or mass.  No calf pain.  Neurologically intact straight leg raise negative.  Painless range of motion both hips.  Specialty Comments:  No specialty comments available.  Imaging: No results found.   PMFS History: Patient Active Problem List  Diagnosis Date Noted  . Lateral meniscus tear 10/22/2020  . Pain in left knee 08/15/2020  . Generalized abdominal pain 02/20/2020  . Mild persistent asthma 04/21/2016  . 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 Chronic pain associated with significant psychosocial dysfunction 12/19/2015  . 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 Fibromyalgia 12/19/2015  . HLD (hyperlipidemia) 12/19/2015  . Hypertension 12/19/2015  . Cannot sleep 12/19/2015  . Lumbar radiculopathy 12/19/2015  . Polypharmacy 11/29/2015  . Chronic ethmoidal sinusitis 10/01/2015  . Antritis chronic 09/18/2015  . Anemia 01/18/2015  . 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 Cardiac murmur 03/08/2012  . Chronic neck pain 02/10/2012  .  Chronic elbow pain 02/10/2012  . Bilateral chronic knee pain 02/10/2012  . Chronic hand pain 02/10/2012  . Chronic ankle pain 02/10/2012  . Chronic pain 02/10/2012  . Allergic rhinitis 09/22/2011  . Bipolar affective disorder (HCC) 09/22/2011  . Hot flash, menopausal 09/22/2011  . Menopausal and perimenopausal disorder 09/15/2011  . Bipolar affective disorder, depressed, severe, with psychotic behavior (HCC) 08/11/2011  . Type 2 diabetes mellitus (HCC) 08/11/2011  . Hypercholesterolemia 08/11/2011  . Constipation 02/16/2010  . Disturbance in sleep behavior 06/16/2009   Past Medical History:  Diagnosis Date  . Asthma   . Diabetes mellitus   . Hyperlipidemia   . Hypertension     Family History  Problem Relation Age of Onset  . Diabetes Mother   . Heart disease Mother   . Heart disease Father     Past Surgical History:  Procedure Laterality Date  . ABDOMINAL HYSTERECTOMY     Social History   Occupational History  . Not on file  Tobacco Use  . Smoking status: Never Smoker  . Smokeless tobacco: Never Used  Substance and Sexual Activity  . Alcohol use: No  . Drug use: No  . Sexual activity: Yes

## 2021-01-27 MED FILL — MONTELUKAST SOD 10 MG TAB: 10 | 30 days supply | Qty: 30 | Fill #1

## 2021-01-27 MED FILL — clonazePAM 0.5 MG TABS: 0.5 | 30 days supply | Qty: 60 | Fill #0

## 2021-01-28 MED FILL — EMGALITY 120 MG/ML SOAJ: 120 | 30 days supply | Qty: 1 | Fill #3

## 2021-01-30 MED FILL — GABAPENTIN 400 MG CAPSULE: 400 | 30 days supply | Qty: 120 | Fill #1

## 2021-02-06 ENCOUNTER — Other Ambulatory Visit (HOSPITAL_BASED_OUTPATIENT_CLINIC_OR_DEPARTMENT_OTHER): Payer: Self-pay

## 2021-02-11 ENCOUNTER — Other Ambulatory Visit (HOSPITAL_COMMUNITY): Payer: Self-pay

## 2021-02-17 ENCOUNTER — Other Ambulatory Visit (HOSPITAL_COMMUNITY): Payer: Self-pay

## 2021-02-17 ENCOUNTER — Other Ambulatory Visit: Payer: Self-pay

## 2021-02-17 MED FILL — Eszopiclone Tab 2 MG: ORAL | 30 days supply | Qty: 30 | Fill #0 | Status: AC

## 2021-02-18 ENCOUNTER — Other Ambulatory Visit (HOSPITAL_COMMUNITY): Payer: Self-pay

## 2021-02-18 MED ORDER — QUETIAPINE FUMARATE 400 MG PO TABS
400.0000 mg | ORAL_TABLET | Freq: Every evening | ORAL | 0 refills | Status: DC
  Filled 2021-02-18: qty 90, 90d supply, fill #0

## 2021-02-18 MED FILL — Metformin HCl Tab ER 24HR 500 MG: ORAL | 90 days supply | Qty: 360 | Fill #0 | Status: AC

## 2021-02-18 MED FILL — Fluticasone Propionate Nasal Susp 50 MCG/ACT: NASAL | 30 days supply | Qty: 16 | Fill #0 | Status: AC

## 2021-02-19 ENCOUNTER — Other Ambulatory Visit (HOSPITAL_COMMUNITY): Payer: Self-pay

## 2021-02-20 ENCOUNTER — Other Ambulatory Visit (HOSPITAL_COMMUNITY): Payer: Self-pay

## 2021-02-20 ENCOUNTER — Other Ambulatory Visit: Payer: Self-pay | Admitting: Family Medicine

## 2021-02-20 DIAGNOSIS — R1084 Generalized abdominal pain: Secondary | ICD-10-CM

## 2021-02-20 MED ORDER — DICYCLOMINE HCL 10 MG PO CAPS
10.0000 mg | ORAL_CAPSULE | Freq: Three times a day (TID) | ORAL | 0 refills | Status: DC | PRN
  Filled 2021-02-20: qty 45, 15d supply, fill #0

## 2021-02-21 ENCOUNTER — Other Ambulatory Visit (HOSPITAL_COMMUNITY): Payer: Self-pay

## 2021-02-24 ENCOUNTER — Other Ambulatory Visit (HOSPITAL_COMMUNITY): Payer: Self-pay

## 2021-02-24 MED FILL — Baclofen Tab 10 MG: ORAL | 60 days supply | Qty: 120 | Fill #0 | Status: AC

## 2021-02-24 MED FILL — Montelukast Sodium Tab 10 MG (Base Equiv): ORAL | 30 days supply | Qty: 30 | Fill #0 | Status: AC

## 2021-02-27 ENCOUNTER — Other Ambulatory Visit: Payer: Self-pay

## 2021-02-27 ENCOUNTER — Other Ambulatory Visit (HOSPITAL_COMMUNITY): Payer: Self-pay

## 2021-02-27 MED ORDER — CLONAZEPAM 0.5 MG PO TABS
0.5000 mg | ORAL_TABLET | Freq: Two times a day (BID) | ORAL | 0 refills | Status: DC | PRN
  Filled 2021-02-27: qty 60, 30d supply, fill #0

## 2021-03-05 ENCOUNTER — Other Ambulatory Visit (HOSPITAL_COMMUNITY): Payer: Self-pay

## 2021-03-05 MED FILL — Tizanidine HCl Cap 6 MG (Base Equivalent): ORAL | 30 days supply | Qty: 60 | Fill #0 | Status: AC

## 2021-03-05 MED FILL — Gabapentin Cap 400 MG: ORAL | 30 days supply | Qty: 120 | Fill #0 | Status: AC

## 2021-03-10 ENCOUNTER — Ambulatory Visit: Payer: 59 | Admitting: Family Medicine

## 2021-03-17 ENCOUNTER — Other Ambulatory Visit (HOSPITAL_COMMUNITY): Payer: Self-pay

## 2021-03-17 ENCOUNTER — Other Ambulatory Visit: Payer: Self-pay | Admitting: Family Medicine

## 2021-03-17 ENCOUNTER — Ambulatory Visit: Payer: 59 | Admitting: Family Medicine

## 2021-03-17 DIAGNOSIS — K5900 Constipation, unspecified: Secondary | ICD-10-CM

## 2021-03-17 MED FILL — Eszopiclone Tab 2 MG: ORAL | 30 days supply | Qty: 30 | Fill #1 | Status: AC

## 2021-03-18 ENCOUNTER — Other Ambulatory Visit (HOSPITAL_COMMUNITY): Payer: Self-pay

## 2021-03-18 ENCOUNTER — Other Ambulatory Visit: Payer: Self-pay | Admitting: Family Medicine

## 2021-03-18 DIAGNOSIS — K5900 Constipation, unspecified: Secondary | ICD-10-CM

## 2021-03-18 MED ORDER — LINACLOTIDE 72 MCG PO CAPS
72.0000 ug | ORAL_CAPSULE | Freq: Every day | ORAL | 2 refills | Status: DC
  Filled 2021-03-18: qty 90, 90d supply, fill #0
  Filled 2021-06-05: qty 90, 90d supply, fill #1

## 2021-03-19 ENCOUNTER — Other Ambulatory Visit (HOSPITAL_COMMUNITY): Payer: Self-pay

## 2021-03-24 ENCOUNTER — Ambulatory Visit: Payer: 59 | Admitting: Family Medicine

## 2021-03-28 ENCOUNTER — Other Ambulatory Visit (HOSPITAL_COMMUNITY): Payer: Self-pay

## 2021-03-28 MED FILL — Clonazepam Tab 0.5 MG: ORAL | 30 days supply | Qty: 60 | Fill #0 | Status: AC

## 2021-03-28 MED FILL — Montelukast Sodium Tab 10 MG (Base Equiv): ORAL | 30 days supply | Qty: 30 | Fill #1 | Status: AC

## 2021-04-02 ENCOUNTER — Ambulatory Visit (INDEPENDENT_AMBULATORY_CARE_PROVIDER_SITE_OTHER): Payer: Medicare HMO | Admitting: Family Medicine

## 2021-04-02 ENCOUNTER — Encounter: Payer: Self-pay | Admitting: Family Medicine

## 2021-04-02 ENCOUNTER — Other Ambulatory Visit (HOSPITAL_COMMUNITY): Payer: Self-pay

## 2021-04-02 ENCOUNTER — Other Ambulatory Visit: Payer: Self-pay

## 2021-04-02 VITALS — BP 110/58 | HR 82 | Ht 63.0 in | Wt 152.1 lb

## 2021-04-02 DIAGNOSIS — M797 Fibromyalgia: Secondary | ICD-10-CM

## 2021-04-02 DIAGNOSIS — Z13820 Encounter for screening for osteoporosis: Secondary | ICD-10-CM

## 2021-04-02 DIAGNOSIS — E119 Type 2 diabetes mellitus without complications: Secondary | ICD-10-CM

## 2021-04-02 DIAGNOSIS — D649 Anemia, unspecified: Secondary | ICD-10-CM

## 2021-04-02 DIAGNOSIS — E785 Hyperlipidemia, unspecified: Secondary | ICD-10-CM

## 2021-04-02 DIAGNOSIS — G43009 Migraine without aura, not intractable, without status migrainosus: Secondary | ICD-10-CM | POA: Diagnosis not present

## 2021-04-02 LAB — POCT GLYCOSYLATED HEMOGLOBIN (HGB A1C): HbA1c, POC (controlled diabetic range): 6.9 % (ref 0.0–7.0)

## 2021-04-02 MED ORDER — RIZATRIPTAN BENZOATE 10 MG PO TABS
10.0000 mg | ORAL_TABLET | ORAL | 0 refills | Status: DC | PRN
  Filled 2021-04-02: qty 10, 30d supply, fill #0

## 2021-04-02 MED ORDER — TIZANIDINE HCL 6 MG PO CAPS
6.0000 mg | ORAL_CAPSULE | Freq: Two times a day (BID) | ORAL | 1 refills | Status: DC
  Filled 2021-04-02: qty 120, 60d supply, fill #0
  Filled 2021-06-05: qty 120, 60d supply, fill #1

## 2021-04-02 NOTE — Progress Notes (Signed)
    SUBJECTIVE:   CHIEF COMPLAINT / HPI:  Migraines Her current medication includes: -Topiramate 100 mg twice daily -Rizatriptan 10 mg as needed She reports that she has been using her triptan medicine about once per month.  Her last refill of 8 pills was prescribed this past November.  She is requesting a refill of her rizatriptan.  Diabetes Her current medication includes: -Metformin 1000 mg twice daily -Losartan 25 mg daily -Milligrams daily  Fibromyalgia Her current medication includes: -Baclofen 10 mg daily -Gabapentin 400 mg 4 times daily -Fluoxetine 20 mg daily -Duloxetine 60 mg daily -tizanidine 6 mg daily Labs with her medication for bipolar affective disorder.  She reports that her fibromyalgia symptoms have mildly worsened in the past month but are easily tolerable.  She is not interested in any medication changes at this time.  She is not currently seeing a therapist or participating in daily exercise apart from caring for her granddaughter and puppy.  Bipolar affective disorder Her current medication includes: -Seroquel 400 mg nightly -Roxatidine 20 mg daily -Duloxetine 60 mg daily  Irritable bowel syndrome Her current medications include: -Bentyl 10 mg 3 times daily. -Linsess 72 mcg daily  Anemia, iron deficiency Her current medications include: -Oral iron supplementation She was previously referred to GI for colonoscopy but did not schedule an appointment.  The referral notes indicate that she was called 3 times without success and the referral was closed.  PERTINENT  PMH / PSH: See HPI  OBJECTIVE:   BP (!) 110/58   Pulse 82   Ht 5\' 3"  (1.6 m)   Wt 152 lb 2 oz (69 kg)   SpO2 96%   BMI 26.95 kg/m    General: Alert and cooperative and appears to be in no acute distress Cardio: Normal S1 and S2, no S3 or S4. Rhythm is regular. No murmurs or rubs.   Pulm: Clear to auscultation bilaterally, no crackles, wheezing, or diminished breath sounds. Normal  respiratory effort Abdomen: Bowel sounds normal. Abdomen soft and non-tender.  Extremities: No peripheral edema. Warm/ well perfused.  Strong radial pulses. Neuro: Cranial nerves grossly intact  ASSESSMENT/PLAN:   Atypical migraine Well-controlled.  Continue current regimen. -Triptan refilled  Type 2 diabetes mellitus (HCC) A1c at goal under 7. -Continue current regimen   HLD (hyperlipidemia) -Follow-up lipid panel  Anemia We discussed the importance of following up with GI to rule out colon cancer. -Continue iron supplements -Follow-up CBC -Placed new referral to GI  Fibromyalgia Mildly worsened symptoms.  No interested in any medication changes at this time.  She was encouraged to engage in daily physical activity.   Healthcare maintenance -DEXA scan ordered  , MD [facility]

## 2021-04-02 NOTE — Assessment & Plan Note (Signed)
Follow-up lipid panel 

## 2021-04-02 NOTE — Assessment & Plan Note (Signed)
We discussed the importance of following up with GI to rule out colon cancer. -Continue iron supplements -Follow-up CBC -Placed new referral to GI

## 2021-04-02 NOTE — Assessment & Plan Note (Addendum)
Well-controlled.  Continue current regimen. -Triptan refilled

## 2021-04-02 NOTE — Assessment & Plan Note (Signed)
Mildly worsened symptoms.  No interested in any medication changes at this time.  She was encouraged to engage in daily physical activity.

## 2021-04-02 NOTE — Assessment & Plan Note (Signed)
A1c at goal under 7. -Continue current regimen

## 2021-04-02 NOTE — Patient Instructions (Signed)
It was great to see you today.  Here is a quick review of the things we talked about:  Anemia: We will recheck your blood work today to see if your blood levels have improved. I have placed a referral to gastroenterology for you to get a colonoscopy.  You should get a call in the next 1-2 weeks to set up your appointment.  Please let me know if you have not received a call in the next 2 weeks.  Screening for osteoporosis: I have put in an order for you to have a DEXA scan.  Please call the breast center to schedule your appointment.  Cholesterol: We are going to check your blood level today.  Pain: I have sent in a prescription for 10 pills of the medicine you requested.  If all of your labs are normal, I will send you a message over my chart or send you a letter.  If there is anything to discuss, I will give you a phone call.

## 2021-04-03 LAB — CBC WITH DIFFERENTIAL/PLATELET
Basophils Absolute: 0 10*3/uL (ref 0.0–0.2)
Basos: 0 %
EOS (ABSOLUTE): 0.2 10*3/uL (ref 0.0–0.4)
Eos: 3 %
Hematocrit: 40 % (ref 34.0–46.6)
Hemoglobin: 12.6 g/dL (ref 11.1–15.9)
Immature Grans (Abs): 0 10*3/uL (ref 0.0–0.1)
Immature Granulocytes: 0 %
Lymphocytes Absolute: 1.6 10*3/uL (ref 0.7–3.1)
Lymphs: 22 %
MCH: 25.3 pg — ABNORMAL LOW (ref 26.6–33.0)
MCHC: 31.5 g/dL (ref 31.5–35.7)
MCV: 80 fL (ref 79–97)
Monocytes Absolute: 0.5 10*3/uL (ref 0.1–0.9)
Monocytes: 7 %
Neutrophils Absolute: 4.9 10*3/uL (ref 1.4–7.0)
Neutrophils: 68 %
Platelets: 352 10*3/uL (ref 150–450)
RBC: 4.99 x10E6/uL (ref 3.77–5.28)
RDW: 18.7 % — ABNORMAL HIGH (ref 11.7–15.4)
WBC: 7.2 10*3/uL (ref 3.4–10.8)

## 2021-04-03 LAB — LIPID PANEL
Chol/HDL Ratio: 2.7 ratio (ref 0.0–4.4)
Cholesterol, Total: 211 mg/dL — ABNORMAL HIGH (ref 100–199)
HDL: 77 mg/dL (ref >39)
LDL Chol Calc (NIH): 87 mg/dL (ref 0–99)
Triglycerides: 292 mg/dL — ABNORMAL HIGH (ref 0–149)
VLDL Cholesterol Cal: 47 mg/dL — ABNORMAL HIGH (ref 5–40)

## 2021-04-04 ENCOUNTER — Other Ambulatory Visit (HOSPITAL_COMMUNITY): Payer: Self-pay

## 2021-04-04 MED FILL — Gabapentin Cap 400 MG: ORAL | 30 days supply | Qty: 120 | Fill #1 | Status: AC

## 2021-04-07 ENCOUNTER — Telehealth: Payer: Self-pay

## 2021-04-07 NOTE — Telephone Encounter (Signed)
Patient calls nurse line requesting recent lab work. Patients asks she prefers a phone call verses mychart message. Please advise.

## 2021-04-08 NOTE — Telephone Encounter (Signed)
I tried to call Ms. Kelly Shannon without success.  If she calls back, please refer to the attestation to her last lipid panel. Mirian Mo, MD

## 2021-04-08 NOTE — Telephone Encounter (Signed)
Patient returns call to nurse line stating that she just missed a call from Dr. Homero Fellers.   Please return call to patient at [phone].   Veronda Prude, RN

## 2021-04-09 ENCOUNTER — Other Ambulatory Visit (HOSPITAL_COMMUNITY): Payer: Self-pay

## 2021-04-09 DIAGNOSIS — F429 Obsessive-compulsive disorder, unspecified: Secondary | ICD-10-CM | POA: Diagnosis not present

## 2021-04-09 DIAGNOSIS — F3161 Bipolar disorder, current episode mixed, mild: Secondary | ICD-10-CM | POA: Diagnosis not present

## 2021-04-09 DIAGNOSIS — F411 Generalized anxiety disorder: Secondary | ICD-10-CM | POA: Diagnosis not present

## 2021-04-09 MED ORDER — ESZOPICLONE 2 MG PO TABS
2.0000 mg | ORAL_TABLET | Freq: Every day | ORAL | 2 refills | Status: DC
  Filled 2021-04-09 – 2021-05-16 (×3): qty 30, 30d supply, fill #0
  Filled 2021-06-17: qty 30, 30d supply, fill #1

## 2021-04-09 MED ORDER — CLONAZEPAM 0.5 MG PO TABS
0.5000 mg | ORAL_TABLET | Freq: Two times a day (BID) | ORAL | 0 refills | Status: AC
  Filled 2021-04-09 – 2021-04-28 (×2): qty 60, 30d supply, fill #0

## 2021-04-09 MED ORDER — DULOXETINE HCL 60 MG PO CPEP
120.0000 mg | ORAL_CAPSULE | Freq: Every day | ORAL | 0 refills | Status: DC
  Filled 2021-04-09: qty 180, 90d supply, fill #0

## 2021-04-09 MED ORDER — CLONAZEPAM 0.5 MG PO TABS
0.5000 mg | ORAL_TABLET | Freq: Two times a day (BID) | ORAL | 0 refills | Status: DC
  Filled 2021-04-09 – 2021-06-30 (×2): qty 60, 30d supply, fill #0

## 2021-04-09 MED ORDER — QUETIAPINE FUMARATE 400 MG PO TABS
400.0000 mg | ORAL_TABLET | Freq: Every evening | ORAL | 0 refills | Status: DC
  Filled 2021-04-09 – 2021-05-22 (×2): qty 90, 90d supply, fill #0

## 2021-04-09 MED ORDER — FLUOXETINE HCL 20 MG PO CAPS
20.0000 mg | ORAL_CAPSULE | Freq: Every day | ORAL | 0 refills | Status: DC
  Filled 2021-04-09: qty 90, 90d supply, fill #0

## 2021-04-09 MED ORDER — CLONAZEPAM 0.5 MG PO TABS
0.5000 mg | ORAL_TABLET | Freq: Two times a day (BID) | ORAL | 0 refills | Status: DC
  Filled 2021-04-09 – 2021-05-30 (×2): qty 60, 30d supply, fill #0

## 2021-04-10 ENCOUNTER — Other Ambulatory Visit (HOSPITAL_COMMUNITY): Payer: Self-pay

## 2021-04-10 ENCOUNTER — Other Ambulatory Visit: Payer: Self-pay | Admitting: Family Medicine

## 2021-04-10 MED ORDER — ATORVASTATIN CALCIUM 40 MG PO TABS
ORAL_TABLET | Freq: Every day | ORAL | 3 refills | Status: AC
  Filled 2021-04-10 (×2): qty 90, 90d supply, fill #0
  Filled 2021-08-31: qty 90, 90d supply, fill #1
  Filled 2021-11-19: qty 90, 90d supply, fill #2
  Filled 2022-03-16: qty 90, 90d supply, fill #3

## 2021-04-10 MED FILL — Topiramate Tab 100 MG: ORAL | 90 days supply | Qty: 180 | Fill #0 | Status: AC

## 2021-04-15 ENCOUNTER — Other Ambulatory Visit (HOSPITAL_COMMUNITY): Payer: Self-pay

## 2021-04-15 MED FILL — Galcanezumab-gnlm Subcutaneous Soln Auto-Injector 120 MG/ML: SUBCUTANEOUS | 30 days supply | Qty: 1 | Fill #0 | Status: AC

## 2021-04-17 MED FILL — Eszopiclone Tab 2 MG: ORAL | 30 days supply | Qty: 30 | Fill #2 | Status: AC

## 2021-04-17 MED FILL — Fluticasone Propionate Nasal Susp 50 MCG/ACT: NASAL | 30 days supply | Qty: 16 | Fill #1 | Status: AC

## 2021-04-18 ENCOUNTER — Other Ambulatory Visit (HOSPITAL_COMMUNITY): Payer: Self-pay

## 2021-04-28 ENCOUNTER — Other Ambulatory Visit (HOSPITAL_COMMUNITY): Payer: Self-pay

## 2021-04-28 ENCOUNTER — Other Ambulatory Visit: Payer: Self-pay | Admitting: Family Medicine

## 2021-04-28 MED ORDER — BACLOFEN 10 MG PO TABS
20.0000 mg | ORAL_TABLET | Freq: Every day | ORAL | 1 refills | Status: DC
  Filled 2021-04-28: qty 120, 60d supply, fill #0
  Filled 2021-06-26: qty 120, 60d supply, fill #1

## 2021-04-28 MED FILL — Montelukast Sodium Tab 10 MG (Base Equiv): ORAL | 30 days supply | Qty: 30 | Fill #2 | Status: AC

## 2021-05-06 ENCOUNTER — Other Ambulatory Visit (HOSPITAL_COMMUNITY): Payer: Self-pay

## 2021-05-06 MED FILL — Gabapentin Cap 400 MG: ORAL | 30 days supply | Qty: 120 | Fill #2 | Status: AC

## 2021-05-15 ENCOUNTER — Other Ambulatory Visit (HOSPITAL_COMMUNITY): Payer: Self-pay

## 2021-05-16 ENCOUNTER — Other Ambulatory Visit (HOSPITAL_COMMUNITY): Payer: Self-pay

## 2021-05-22 ENCOUNTER — Other Ambulatory Visit (HOSPITAL_COMMUNITY): Payer: Self-pay

## 2021-05-23 ENCOUNTER — Ambulatory Visit: Payer: Medicare HMO | Admitting: Family Medicine

## 2021-05-25 DIAGNOSIS — H5213 Myopia, bilateral: Secondary | ICD-10-CM | POA: Diagnosis not present

## 2021-05-30 ENCOUNTER — Other Ambulatory Visit: Payer: Self-pay | Admitting: Family Medicine

## 2021-05-30 ENCOUNTER — Other Ambulatory Visit (HOSPITAL_COMMUNITY): Payer: Self-pay

## 2021-05-30 MED ORDER — METFORMIN HCL ER 500 MG PO TB24
1000.0000 mg | ORAL_TABLET | Freq: Two times a day (BID) | ORAL | 3 refills | Status: AC
  Filled 2021-05-30: qty 360, 90d supply, fill #0

## 2021-05-30 MED FILL — Montelukast Sodium Tab 10 MG (Base Equiv): ORAL | 30 days supply | Qty: 30 | Fill #3 | Status: AC

## 2021-06-02 ENCOUNTER — Other Ambulatory Visit (HOSPITAL_COMMUNITY): Payer: Self-pay

## 2021-06-05 ENCOUNTER — Other Ambulatory Visit (HOSPITAL_COMMUNITY): Payer: Self-pay

## 2021-06-09 ENCOUNTER — Other Ambulatory Visit (HOSPITAL_COMMUNITY): Payer: Self-pay

## 2021-06-09 MED FILL — Gabapentin Cap 400 MG: ORAL | 30 days supply | Qty: 120 | Fill #3 | Status: AC

## 2021-06-16 ENCOUNTER — Ambulatory Visit: Payer: Medicare HMO | Admitting: Student

## 2021-06-18 ENCOUNTER — Other Ambulatory Visit (HOSPITAL_COMMUNITY): Payer: Self-pay

## 2021-06-27 ENCOUNTER — Other Ambulatory Visit (HOSPITAL_COMMUNITY): Payer: Self-pay

## 2021-06-30 ENCOUNTER — Other Ambulatory Visit: Payer: Self-pay | Admitting: Student

## 2021-06-30 ENCOUNTER — Other Ambulatory Visit: Payer: Self-pay | Admitting: Family Medicine

## 2021-06-30 ENCOUNTER — Other Ambulatory Visit (HOSPITAL_COMMUNITY): Payer: Self-pay

## 2021-06-30 DIAGNOSIS — J309 Allergic rhinitis, unspecified: Secondary | ICD-10-CM

## 2021-06-30 MED ORDER — MONTELUKAST SODIUM 10 MG PO TABS
10.0000 mg | ORAL_TABLET | Freq: Every day | ORAL | 3 refills | Status: DC
  Filled 2021-06-30: qty 30, 30d supply, fill #0
  Filled 2021-07-31: qty 30, 30d supply, fill #1
  Filled 2021-08-31: qty 30, 30d supply, fill #2
  Filled 2021-09-30: qty 30, 30d supply, fill #3

## 2021-07-02 ENCOUNTER — Telehealth: Payer: Self-pay

## 2021-07-02 NOTE — Telephone Encounter (Signed)
Left patient message to call clinic about Singulair.

## 2021-07-02 NOTE — Telephone Encounter (Signed)
Called Kelly Shannon and discussed plan to call patient on Singulair use. Has been on it long term. Will discontinue if her mood symptoms are uncontrolled. Would like to see patient in clinic to meet PCP.

## 2021-07-02 NOTE — Telephone Encounter (Signed)
Elpidio Galea, Medical Director of [facility] [employer] plan calls nurse line regarding concerns with possible medication interaction. Reports that Singulair can be contraindicated in patients with psychiatric disorders.   Please advise if patient should continue this medication.   Please return call to Bruce at [phone].  Veronda Prude, RN

## 2021-07-17 ENCOUNTER — Other Ambulatory Visit (HOSPITAL_COMMUNITY): Payer: Self-pay

## 2021-07-17 ENCOUNTER — Other Ambulatory Visit: Payer: Self-pay | Admitting: Family Medicine

## 2021-07-17 DIAGNOSIS — F429 Obsessive-compulsive disorder, unspecified: Secondary | ICD-10-CM | POA: Diagnosis not present

## 2021-07-17 DIAGNOSIS — F319 Bipolar disorder, unspecified: Secondary | ICD-10-CM | POA: Diagnosis not present

## 2021-07-17 DIAGNOSIS — F411 Generalized anxiety disorder: Secondary | ICD-10-CM | POA: Diagnosis not present

## 2021-07-17 MED ORDER — ESZOPICLONE 3 MG PO TABS
3.0000 mg | ORAL_TABLET | Freq: Every evening | ORAL | 2 refills | Status: DC
  Filled 2021-07-17: qty 30, 30d supply, fill #0
  Filled 2021-08-14: qty 30, 30d supply, fill #1
  Filled 2021-09-18: qty 30, 30d supply, fill #2

## 2021-07-17 MED ORDER — QUETIAPINE FUMARATE 400 MG PO TABS
400.0000 mg | ORAL_TABLET | Freq: Every evening | ORAL | 0 refills | Status: DC
  Filled 2021-07-17 – 2021-08-22 (×2): qty 90, 90d supply, fill #0

## 2021-07-17 MED ORDER — CLONAZEPAM 0.5 MG PO TABS
0.5000 mg | ORAL_TABLET | Freq: Two times a day (BID) | ORAL | 0 refills | Status: DC
  Filled 2021-08-01: qty 60, 30d supply, fill #0

## 2021-07-17 MED ORDER — CLONAZEPAM 0.5 MG PO TABS
ORAL_TABLET | ORAL | 0 refills | Status: DC
  Filled 2021-07-17: qty 60, 30d supply, fill #0

## 2021-07-17 MED ORDER — CLONAZEPAM 0.5 MG PO TABS: 0.5000 mg | ORAL_TABLET | Freq: Two times a day (BID) | ORAL | 0 refills | Status: DC

## 2021-07-17 MED ORDER — FLUOXETINE HCL 20 MG PO CAPS
20.0000 mg | ORAL_CAPSULE | Freq: Every day | ORAL | 0 refills | Status: DC
  Filled 2021-07-17: qty 90, 90d supply, fill #0

## 2021-07-18 ENCOUNTER — Other Ambulatory Visit: Payer: Self-pay | Admitting: Family Medicine

## 2021-07-18 ENCOUNTER — Other Ambulatory Visit (HOSPITAL_COMMUNITY): Payer: Self-pay

## 2021-07-21 ENCOUNTER — Other Ambulatory Visit (HOSPITAL_COMMUNITY): Payer: Self-pay

## 2021-07-23 ENCOUNTER — Other Ambulatory Visit (HOSPITAL_COMMUNITY): Payer: Self-pay

## 2021-07-23 MED ORDER — GABAPENTIN 400 MG PO CAPS
800.0000 mg | ORAL_CAPSULE | Freq: Two times a day (BID) | ORAL | 0 refills | Status: DC
  Filled 2021-07-23: qty 30, 8d supply, fill #0

## 2021-07-25 ENCOUNTER — Other Ambulatory Visit (HOSPITAL_COMMUNITY): Payer: Self-pay

## 2021-07-31 ENCOUNTER — Ambulatory Visit
Admission: EM | Admit: 2021-07-31 | Discharge: 2021-07-31 | Disposition: A | Discharge status: Home / Self Care | Payer: Medicare HMO | Attending: Emergency Medicine | Admitting: Emergency Medicine

## 2021-07-31 ENCOUNTER — Other Ambulatory Visit (HOSPITAL_COMMUNITY): Payer: Self-pay

## 2021-07-31 ENCOUNTER — Other Ambulatory Visit: Payer: Self-pay

## 2021-07-31 ENCOUNTER — Ambulatory Visit
Admission: RE | Admit: 2021-07-31 | Discharge: 2021-07-31 | Disposition: A | Discharge status: Home / Self Care | Payer: Medicare HMO | Source: Ambulatory Visit | Attending: Emergency Medicine | Admitting: Emergency Medicine

## 2021-07-31 DIAGNOSIS — R55 Syncope and collapse: Secondary | ICD-10-CM

## 2021-07-31 DIAGNOSIS — M7989 Other specified soft tissue disorders: Secondary | ICD-10-CM | POA: Diagnosis not present

## 2021-07-31 DIAGNOSIS — R197 Diarrhea, unspecified: Secondary | ICD-10-CM

## 2021-07-31 DIAGNOSIS — I739 Peripheral vascular disease, unspecified: Secondary | ICD-10-CM | POA: Diagnosis not present

## 2021-07-31 DIAGNOSIS — R112 Nausea with vomiting, unspecified: Secondary | ICD-10-CM | POA: Diagnosis not present

## 2021-07-31 DIAGNOSIS — M25562 Pain in left knee: Secondary | ICD-10-CM | POA: Diagnosis not present

## 2021-07-31 MED ORDER — CIPROFLOXACIN HCL 500 MG PO TABS
500.0000 mg | ORAL_TABLET | Freq: Two times a day (BID) | ORAL | 0 refills | Status: DC
  Filled 2021-07-31: qty 14, 7d supply, fill #0

## 2021-07-31 MED ORDER — METRONIDAZOLE 500 MG PO TABS
500.0000 mg | ORAL_TABLET | Freq: Two times a day (BID) | ORAL | 0 refills | Status: DC
  Filled 2021-07-31: qty 14, 7d supply, fill #0

## 2021-07-31 MED ORDER — ONDANSETRON 4 MG PO TBDP
4.0000 mg | ORAL_TABLET | Freq: Three times a day (TID) | ORAL | 0 refills | Status: DC | PRN
  Filled 2021-07-31: qty 20, 7d supply, fill #0

## 2021-07-31 MED ORDER — DICLOFENAC SODIUM 1 % EX GEL
2.0000 g | Freq: Four times a day (QID) | CUTANEOUS | 0 refills | Status: DC
  Filled 2021-07-31: qty 400, 50d supply, fill #0

## 2021-07-31 NOTE — ED Provider Notes (Signed)
[facility]    CSN: [id number] Arrival date & time: 07/31/21  0626      History   Chief Complaint Chief Complaint  Patient presents with   Fall    HPI Kelly Shannon is a 70 y.o. female history of hypertension, hyperlipidemia, DM, presenting today for evaluation after a fall.  Patient reports that over the past 3 to 4 days she has had nausea vomiting and diarrhea, feel similar to prior flares of diverticulitis.  Yesterday evening she had a sensation of nausea, stood up to go to the fridge to drink ginger ale, became lightheaded and reports falling to the ground and passing out.  She declines being on blood thinners.  Does report hitting the back of her head and right elbow and left knee.  She has continued to have some dizziness/lightheadedness, but denies repeat sensations of syncope/presyncope.  Husband and herself report overall being at baseline.  Denies history of syncope or history of seizures.  She denies history of tobacco use.  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   Fibromyalgia 12/19/2015   HLD (hyperlipidemia) 12/19/2015   Hypertension 12/19/2015   Cannot sleep 12/19/2015   Lumbar radiculopathy 12/19/2015   Polypharmacy 11/29/2015   Chronic ethmoidal sinusitis 10/01/2015   Antritis chronic 09/18/2015   Anemia 01/18/2015   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   Cardiac murmur 03/08/2012   Chronic neck pain 02/10/2012   Chronic elbow pain 02/10/2012   Bilateral chronic  knee pain 02/10/2012   Chronic hand pain 02/10/2012   Chronic ankle pain 02/10/2012   Chronic pain 02/10/2012   Allergic rhinitis 09/22/2011   Bipolar affective disorder (HCC) 09/22/2011   Hot flash, menopausal 09/22/2011   Menopausal and perimenopausal disorder 09/15/2011   Bipolar affective disorder, depressed, severe, with psychotic behavior (HCC) 08/11/2011   Type 2 diabetes mellitus (HCC) 08/11/2011   Constipation 02/16/2010   Disturbance in sleep behavior 06/16/2009    Past Surgical History:  Procedure Laterality Date   ABDOMINAL HYSTERECTOMY      OB History   No obstetric history on file.      Home Medications    Prior to Admission medications   Medication Sig Start Date End Date Taking? Authorizing Provider  ciprofloxacin (CIPRO) 500 MG tablet Take 1 tablet (500 mg total) by mouth every 12 (twelve) hours for 7 days. 07/31/21 08/07/21 Yes Trinity Haun C, PA-C  diclofenac Sodium (VOLTAREN) 1 % GEL Apply 2 g topically 4 (four) times daily. 07/31/21  Yes Gad Aymond C, PA-C  metroNIDAZOLE (FLAGYL) 500 MG tablet Take 1 tablet (500 mg total) by mouth 2 (two) times daily for 7 days. 07/31/21 08/07/21 Yes Chrisma Hurlock C, PA-C  ondansetron (ZOFRAN ODT) 4 MG disintegrating tablet Take 1 tablet (4 mg total) by mouth every 8 (eight) hours as needed for nausea or vomiting. 07/31/21  Yes Lorenzo Arscott C, PA-C  albuterol (VENTOLIN HFA) 108 (90 Base) MCG/ACT inhaler INHALE 2 PUFFS BY MOUTH INTO THE LUNGS EVERY 4 HOURS AS NEEDED FOR  WHEEZING OR SHORTNESS OF BREATH. 06/25/20   Mirian Mo, MD  atorvastatin (LIPITOR) 40 MG tablet TAKE 1 TABLET BY MOUTH DAILY. 04/10/21 04/10/22  Mirian Mo, MD  baclofen (LIORESAL) 10 MG tablet Take 2 tablets (20 mg total) by mouth at bedtime. 04/28/21   Mirian Mo, MD  clonazePAM (KLONOPIN) 0.5 MG tablet TAKE 1 TABLET BY MOUTH TWICE DAILY. DUE 08/26/20. (1/3) 08/26/20 02/22/21  McDonald, Eber Jones D  clonazePAM (KLONOPIN) 0.5 MG tablet Take 1 tablet (0.5  mg total) by mouth 2 (two) times daily. ( 2 of 3) 04/09/21     clonazePAM (KLONOPIN) 0.5 MG tablet Take 1 tablet (0.5 mg total) by mouth 2 (two) times daily.(1 of 3) 04/09/21     clonazePAM (KLONOPIN) 0.5 MG tablet Take 1 tablet (0.5 mg total) by mouth 2 (two) times daily. (3 of 3) 09/30/21     clonazePAM (KLONOPIN) 0.5 MG tablet Take one tablet bid ( 2 of 3 ) 07/17/21     clonazePAM (KLONOPIN) 0.5 MG tablet Take 1 tablet (0.5 mg total) by mouth 2 (two) times daily. (1 of 3) 08/01/21     dicyclomine (BENTYL) 10 MG capsule TAKE 1 CAPSULE BY MOUTH 3 TIMES DAILY AS NEEDED FOR SPASMS. 02/20/21   Mirian Mo, MD  DULoxetine (CYMBALTA) 60 MG capsule TAKE 1 CAPSULE (60 MG TOTAL) BY MOUTH 2 TIMES DAILY. 02/07/20 02/06/21  Barbette Merino D  DULoxetine (CYMBALTA) 60 MG capsule Take 2 capsules (120 mg total) by mouth daily. 04/09/21     eszopiclone (LUNESTA) 2 MG TABS tablet TAKE 1 TABLET (2 MG TOTAL) BY MOUTH NIGHTLY. TAKE IMMEDIATELY BEFORE BEDTIME 12/31/20     eszopiclone (LUNESTA) 2 MG TABS tablet Take 1 tablet (2 mg total) by mouth nightly. Take immediately before bedtime 04/09/21     Eszopiclone 3 MG TABS Take 1 tablet (3 mg total) by mouth every evening. 07/17/21     ferrous sulfate 324 MG TBEC Take 1 tablet (324 mg total) by mouth every other day. 01/01/21   Mirian Mo, MD  FLUoxetine (PROZAC) 20 MG capsule TAKE 1 CAPSULE (20 MG TOTAL) BY MOUTH DAILY. 12/31/20 12/31/21  Barbette Merino D  FLUoxetine (PROZAC) 20 MG capsule Take 1 capsule (20 mg total) by mouth daily. 04/09/21     FLUoxetine (PROZAC) 20 MG capsule Take 1 capsule (20 mg total) by mouth daily. 07/17/21     fluticasone (FLONASE) 50 MCG/ACT nasal spray PLACE 2 SPRAYS INTO BOTH NOSTRILS DAILY. 11/28/20 11/28/21  Dana Allan, MD  furosemide (LASIX) 40 MG tablet Take 1 tablet (40 mg total) by mouth daily. 08/02/20   Mirian Mo, MD  gabapentin (NEURONTIN) 400 MG capsule Take 2 capsules (800 mg total) by mouth 2 (two) times daily. 07/23/21   Levin Erp, MD   Galcanezumab-gnlm 120 MG/ML SOAJ INJECT 1 ML (120 MG TOTAL) INTO THE SKIN EVERY 30 DAYS. 08/12/20 08/12/21  Iven Finn., MD  glucose blood test strip USE TO CHECK BLOOD SUGAR DAILY IN THE MORNING 08/19/20 08/19/21  Mirian Mo, MD  Lifitegrast 5 % SOLN PLACE 1 DROP IN BOTH EYES 2 TIMES DAILY 11/14/20 11/14/21  Poudyal, Ritesh, OD  linaclotide (LINZESS) 72 MCG capsule Take 1 capsule (72 mcg total) by mouth daily before breakfast. 03/18/21   Mirian Mo, MD  losartan (COZAAR) 25 MG tablet TAKE 1 TABLET BY MOUTH DAILY. 05/17/18   Tillman Sers, DO  metFORMIN (GLUCOPHAGE-XR) 500 MG 24 hr tablet Take 2 tablets (1,000 mg total) by mouth in the morning and  at bedtime. 05/30/21   Bess Kinds, MD  montelukast (SINGULAIR) 10 MG tablet Take 1 tablet (10 mg total) by mouth at bedtime. 06/30/21   Levin Erp, MD  prochlorperazine (COMPAZINE) 5 MG tablet TAKE 1 TABLET (5 MG TOTAL) BY MOUTH EVERY 6 (SIX) HOURS AS NEEDED FOR NAUSEA OR VOMITING. 01/16/21 01/16/22  Mirian Mo, MD  QUEtiapine (SEROQUEL) 400 MG tablet Take 1 tablet (400 mg total) by mouth nightly. 07/17/21     rizatriptan (MAXALT) 10 MG tablet Take 1 tablet (10 mg total) by mouth as needed for migraine. May repeat in 2 hours if needed 04/02/21   Mirian Mo, MD  topiramate (TOPAMAX) 100 MG tablet TAKE 1 TABLET (100 MG TOTAL) BY MOUTH 2 (TWO) TIMES DAILY. 01/16/21 01/16/22  Mirian Mo, MD    Family History Family History  Problem Relation Age of Onset   Diabetes Mother    Heart disease Mother    Heart disease Father     Social History Social History   Tobacco Use   Smoking status: Never   Smokeless tobacco: Never  Substance Use Topics   Alcohol use: No   Drug use: No     Allergies   Lyrica [pregabalin], Peanut oil, Penicillins, Sulfa antibiotics, Sulfamethoxazole-trimethoprim, and Lisinopril   Review of Systems Review of Systems  Constitutional:  Negative for fatigue and fever.  HENT:  Negative for congestion, sinus  pressure and sore throat.   Eyes:  Negative for photophobia, pain and visual disturbance.  Respiratory:  Negative for cough and shortness of breath.   Cardiovascular:  Negative for chest pain.  Gastrointestinal:  Positive for diarrhea, nausea and vomiting. Negative for abdominal pain.  Genitourinary:  Negative for decreased urine volume and hematuria.  Musculoskeletal:  Negative for myalgias, neck pain and neck stiffness.  Neurological:  Positive for dizziness, syncope and light-headedness. Negative for facial asymmetry, speech difficulty, weakness, numbness and headaches.    Physical Exam Triage Vital Signs ED Triage Vitals  Enc Vitals Group     BP 07/31/21 0942 122/73     Pulse Rate 07/31/21 0942 (!) 104     Resp 07/31/21 0942 20     Temp 07/31/21 0942 98.7 F (37.1 C)     Temp Source 07/31/21 0942 Oral     SpO2 07/31/21 0942 97 %     Weight --      Height --      Head Circumference --      Peak Flow --      Pain Score 07/31/21 0939 8     Pain Loc --      Pain Edu? --      Excl. in GC? --    Orthostatic VS for the past 24 hrs:  BP- Lying Pulse- Lying BP- Sitting Pulse- Sitting BP- Standing at 0 minutes Pulse- Standing at 0 minutes  07/31/21 1004 124/73 100 126/74 101 126/70 106    Updated Vital Signs BP 122/73 (BP Location: Right Arm)   Pulse (!) 104   Temp 98.7 F (37.1 C) (Oral)   Resp 20   SpO2 97%   Visual Acuity Right Eye Distance:   Left Eye Distance:   Bilateral Distance:    Right Eye Near:   Left Eye Near:    Bilateral Near:     Physical Exam Vitals and nursing note reviewed.  Constitutional:      Appearance: She is well-developed.     Comments: No acute distress  HENT:     Head: Normocephalic  and atraumatic.     Ears:     Comments: Bilateral ears without tenderness to palpation of external auricle, tragus and mastoid, EAC's without erythema or swelling, TM's with good bony landmarks and cone of light. Non erythematous.  No hemotympanum     Nose: Nose normal.     Mouth/Throat:     Comments: Oral mucosa pink and moist, no tonsillar enlargement or exudate. Posterior pharynx patent and nonerythematous, no uvula deviation or swelling. Normal phonation.  Eyes:     Extraocular Movements: Extraocular movements intact.     Conjunctiva/sclera: Conjunctivae normal.     Pupils: Pupils are equal, round, and reactive to light.  Cardiovascular:     Rate and Rhythm: Normal rate and regular rhythm.  Pulmonary:     Effort: Pulmonary effort is normal. No respiratory distress.     Comments: Breathing comfortably at rest, CTABL, no wheezing, rales or other adventitious sounds auscultated  Abdominal:     General: There is no distension.  Musculoskeletal:        General: Normal range of motion.     Cervical back: Neck supple.     Comments: Left knee: Bruising noted to anterior proximal shin with associated tenderness, tenderness extending into inferior patella area, over patella and medial joint line, no popliteal tenderness, full extension and flexion  Skin:    General: Skin is warm and dry.  Neurological:     General: No focal deficit present.     Mental Status: She is alert and oriented to person, place, and time. Mental status is at baseline.     Cranial Nerves: No cranial nerve deficit.     Motor: No weakness.     Gait: Gait normal.     UC Treatments / Results  Labs (all labs ordered are listed, but only abnormal results are displayed) Labs Reviewed  CBC WITH DIFFERENTIAL/PLATELET  COMPREHENSIVE METABOLIC PANEL  LIPASE    EKG   Radiology No results found.  Procedures Procedures (including critical care time)  Medications Ordered in UC Medications - No data to display  Initial Impression / Assessment and Plan / UC Course  I have reviewed the triage vital signs and the nursing notes.  Pertinent labs & imaging results that were available during my care of the patient were reviewed by me and considered in my medical  decision making (see chart for details).     Nausea vomiting diarrhea-patient reports similar to prior diverticulitis spells, will provide Cipro and Flagyl to help with Diverticulitis, Zofran for nausea, blood work pending Syncope-at baseline, no neurodeficits, not on anticoagulants, EKG normal sinus rhythm, no acute signs of ischemia or infarction, negative orthostatics, checking blood work, will call with results  Discussed importance of hydration, and close monitoring of symptoms over the next 24 to 48 hours, if having recurrent syncope, worsening vomiting, worsening dizziness or lightheadedness patient should go to emergency room.  Discussed strict return precautions. Patient verbalized understanding and is agreeable with plan.  Final Clinical Impressions(s) / UC Diagnoses   Final diagnoses:  Syncope, unspecified syncope type  Nausea vomiting and diarrhea     Discharge Instructions      Blood work pending Begin Cipro and Flagyl twice daily x1 week with food Zofran dissolved in mouth as needed for nausea/vomiting Focus on rehydration-drink plenty of water and fluids Please ensure symptoms are resolving, if recurring or developing recurrent spells of vomiting, worsening abdominal pain or recurrent passing out, please go to emergency room immediately  ED Prescriptions     Medication Sig Dispense Auth. Provider   ondansetron (ZOFRAN ODT) 4 MG disintegrating tablet Take 1 tablet (4 mg total) by mouth every 8 (eight) hours as needed for nausea or vomiting. 20 tablet Khyle Goodell C, PA-C   ciprofloxacin (CIPRO) 500 MG tablet Take 1 tablet (500 mg total) by mouth every 12 (twelve) hours for 7 days. 14 tablet Jayona Mccaig C, PA-C   metroNIDAZOLE (FLAGYL) 500 MG tablet Take 1 tablet (500 mg total) by mouth 2 (two) times daily for 7 days. 14 tablet Jalila Goodnough C, PA-C   diclofenac Sodium (VOLTAREN) 1 % GEL Apply 2 g topically 4 (four) times daily. 350 g Takao Lizer, [place] C,  PA-C      PDMP not reviewed this encounter.   Michele Judy, [place] C, PA-C 07/31/21 1031

## 2021-07-31 NOTE — ED Triage Notes (Signed)
Pt states yesterday she got dizzy and passed out. Pt states on her fall she fell back and hi her head (head is sore to area hit) and she has left inner knee pain (pt is ambulatory). Patient denies dizziness at this time.    Patient is A&Ox4, no active bleeding.

## 2021-07-31 NOTE — Discharge Instructions (Addendum)
Blood work pending Begin Cipro and Flagyl twice daily x1 week with food Zofran dissolved in mouth as needed for nausea/vomiting Focus on rehydration-drink plenty of water and fluids Please ensure symptoms are resolving, if recurring or developing recurrent spells of vomiting, worsening abdominal pain or recurrent passing out, please go to emergency room immediately

## 2021-08-01 ENCOUNTER — Other Ambulatory Visit (HOSPITAL_COMMUNITY): Payer: Self-pay

## 2021-08-01 LAB — COMPREHENSIVE METABOLIC PANEL
ALT: 16 IU/L (ref 0–32)
AST: 18 IU/L (ref 0–40)
Albumin/Globulin Ratio: 1.7 (ref 1.2–2.2)
Albumin: 3.9 g/dL (ref 3.8–4.8)
Alkaline Phosphatase: 117 IU/L (ref 44–121)
BUN/Creatinine Ratio: 39 — ABNORMAL HIGH (ref 12–28)
BUN: 31 mg/dL — ABNORMAL HIGH (ref 8–27)
Bilirubin Total: <0.2 mg/dL (ref 0.0–1.2)
CO2: 19 mmol/L — ABNORMAL LOW (ref 20–29)
Calcium: 8.9 mg/dL (ref 8.7–10.3)
Chloride: 104 mmol/L (ref 96–106)
Creatinine, Ser: 0.79 mg/dL (ref 0.57–1.00)
Globulin, Total: 2.3 g/dL (ref 1.5–4.5)
Glucose: 279 mg/dL — ABNORMAL HIGH (ref 70–99)
Potassium: 4.7 mmol/L (ref 3.5–5.2)
Sodium: 140 mmol/L (ref 134–144)
Total Protein: 6.2 g/dL (ref 6.0–8.5)
eGFR: 81 mL/min/{1.73_m2} (ref >59)

## 2021-08-01 LAB — CBC WITH DIFFERENTIAL/PLATELET
Basophils Absolute: 0 10*3/uL (ref 0.0–0.2)
Basos: 0 %
EOS (ABSOLUTE): 0 10*3/uL (ref 0.0–0.4)
Eos: 0 %
Hematocrit: 20.5 % — ABNORMAL LOW (ref 34.0–46.6)
Hemoglobin: 6.4 g/dL — CL (ref 11.1–15.9)
Immature Grans (Abs): 0.1 10*3/uL (ref 0.0–0.1)
Immature Granulocytes: 1 %
Lymphocytes Absolute: 1.5 10*3/uL (ref 0.7–3.1)
Lymphs: 8 %
MCH: 26.3 pg — ABNORMAL LOW (ref 26.6–33.0)
MCHC: 31.2 g/dL — ABNORMAL LOW (ref 31.5–35.7)
MCV: 84 fL (ref 79–97)
Monocytes Absolute: 0.8 10*3/uL (ref 0.1–0.9)
Monocytes: 4 %
Neutrophils Absolute: 15.3 10*3/uL — ABNORMAL HIGH (ref 1.4–7.0)
Neutrophils: 87 %
Platelets: 453 10*3/uL — ABNORMAL HIGH (ref 150–450)
RBC: 2.43 x10E6/uL — CL (ref 3.77–5.28)
RDW: 17 % — ABNORMAL HIGH (ref 11.7–15.4)
WBC: 17.7 10*3/uL — ABNORMAL HIGH (ref 3.4–10.8)

## 2021-08-01 LAB — LIPASE: Lipase: 49 U/L (ref 14–72)

## 2021-08-07 ENCOUNTER — Other Ambulatory Visit: Payer: Self-pay

## 2021-08-07 ENCOUNTER — Encounter (HOSPITAL_COMMUNITY): Payer: Self-pay | Admitting: Emergency Medicine

## 2021-08-07 ENCOUNTER — Ambulatory Visit (INDEPENDENT_AMBULATORY_CARE_PROVIDER_SITE_OTHER): Payer: 59 | Admitting: Family Medicine

## 2021-08-07 ENCOUNTER — Emergency Department (HOSPITAL_COMMUNITY): Payer: 59

## 2021-08-07 ENCOUNTER — Encounter: Payer: Self-pay | Admitting: Family Medicine

## 2021-08-07 ENCOUNTER — Observation Stay (HOSPITAL_COMMUNITY)
Admission: EM | Admit: 2021-08-07 | Discharge: 2021-08-08 | Disposition: A | Discharge status: Home / Self Care | Payer: 59 | Attending: Internal Medicine | Admitting: Internal Medicine

## 2021-08-07 VITALS — BP 133/68 | HR 89 | Ht 63.0 in | Wt 161.0 lb

## 2021-08-07 DIAGNOSIS — E119 Type 2 diabetes mellitus without complications: Secondary | ICD-10-CM | POA: Diagnosis not present

## 2021-08-07 DIAGNOSIS — F32A Depression, unspecified: Secondary | ICD-10-CM

## 2021-08-07 DIAGNOSIS — J45909 Unspecified asthma, uncomplicated: Secondary | ICD-10-CM | POA: Diagnosis not present

## 2021-08-07 DIAGNOSIS — D649 Anemia, unspecified: Principal | ICD-10-CM | POA: Insufficient documentation

## 2021-08-07 DIAGNOSIS — I10 Essential (primary) hypertension: Secondary | ICD-10-CM | POA: Insufficient documentation

## 2021-08-07 DIAGNOSIS — F419 Anxiety disorder, unspecified: Secondary | ICD-10-CM | POA: Diagnosis not present

## 2021-08-07 DIAGNOSIS — Z91010 Allergy to peanuts: Secondary | ICD-10-CM | POA: Diagnosis not present

## 2021-08-07 DIAGNOSIS — Z7984 Long term (current) use of oral hypoglycemic drugs: Secondary | ICD-10-CM | POA: Insufficient documentation

## 2021-08-07 DIAGNOSIS — Z20822 Contact with and (suspected) exposure to covid-19: Secondary | ICD-10-CM | POA: Diagnosis not present

## 2021-08-07 DIAGNOSIS — E876 Hypokalemia: Secondary | ICD-10-CM | POA: Insufficient documentation

## 2021-08-07 DIAGNOSIS — R55 Syncope and collapse: Secondary | ICD-10-CM | POA: Diagnosis not present

## 2021-08-07 DIAGNOSIS — I700 Atherosclerosis of aorta: Secondary | ICD-10-CM | POA: Diagnosis not present

## 2021-08-07 DIAGNOSIS — Z79899 Other long term (current) drug therapy: Secondary | ICD-10-CM | POA: Diagnosis not present

## 2021-08-07 DIAGNOSIS — Z23 Encounter for immunization: Secondary | ICD-10-CM | POA: Insufficient documentation

## 2021-08-07 LAB — CBC WITH DIFFERENTIAL/PLATELET
Abs Immature Granulocytes: 0.1 10*3/uL — ABNORMAL HIGH (ref 0.00–0.07)
Basophils Absolute: 0 10*3/uL (ref 0.0–0.1)
Basophils Relative: 1 %
Eosinophils Absolute: 0.4 10*3/uL (ref 0.0–0.5)
Eosinophils Relative: 6 %
HCT: 19.7 % — ABNORMAL LOW (ref 36.0–46.0)
Hemoglobin: 5.9 g/dL — CL (ref 12.0–15.0)
Immature Granulocytes: 2 %
Lymphocytes Relative: 15 %
Lymphs Abs: 1 10*3/uL (ref 0.7–4.0)
MCH: 26.1 pg (ref 26.0–34.0)
MCHC: 29.9 g/dL — ABNORMAL LOW (ref 30.0–36.0)
MCV: 87.2 fL (ref 80.0–100.0)
Monocytes Absolute: 0.5 10*3/uL (ref 0.1–1.0)
Monocytes Relative: 8 %
Neutro Abs: 4.8 10*3/uL (ref 1.7–7.7)
Neutrophils Relative %: 68 %
Platelets: 481 10*3/uL — ABNORMAL HIGH (ref 150–400)
RBC: 2.26 MIL/uL — ABNORMAL LOW (ref 3.87–5.11)
RDW: 18.7 % — ABNORMAL HIGH (ref 11.5–15.5)
WBC: 6.8 10*3/uL (ref 4.0–10.5)
nRBC: 0.3 % — ABNORMAL HIGH (ref 0.0–0.2)

## 2021-08-07 LAB — COMPREHENSIVE METABOLIC PANEL
ALT: 21 U/L (ref 0–44)
AST: 23 U/L (ref 15–41)
Albumin: 3.5 g/dL (ref 3.5–5.0)
Alkaline Phosphatase: 111 U/L (ref 38–126)
Anion gap: 10 (ref 5–15)
BUN: 10 mg/dL (ref 8–23)
CO2: 23 mmol/L (ref 22–32)
Calcium: 8.5 mg/dL — ABNORMAL LOW (ref 8.9–10.3)
Chloride: 102 mmol/L (ref 98–111)
Creatinine, Ser: 0.73 mg/dL (ref 0.44–1.00)
GFR, Estimated: >60 mL/min (ref >60)
Glucose, Bld: 149 mg/dL — ABNORMAL HIGH (ref 70–99)
Potassium: 2.9 mmol/L — ABNORMAL LOW (ref 3.5–5.1)
Sodium: 135 mmol/L (ref 135–145)
Total Bilirubin: 0.3 mg/dL (ref 0.3–1.2)
Total Protein: 6.9 g/dL (ref 6.5–8.1)

## 2021-08-07 LAB — CBG MONITORING, ED: Glucose-Capillary: 124 mg/dL — ABNORMAL HIGH (ref 70–99)

## 2021-08-07 LAB — RETICULOCYTES
Immature Retic Fract: 7.5 % (ref 2.3–15.9)
RBC.: 2.24 MIL/uL — ABNORMAL LOW (ref 3.87–5.11)
Retic Count, Absolute: 81 10*3/uL (ref 19.0–186.0)
Retic Ct Pct: 3.6 % — ABNORMAL HIGH (ref 0.4–3.1)

## 2021-08-07 LAB — POCT GLYCOSYLATED HEMOGLOBIN (HGB A1C): HbA1c, POC (controlled diabetic range): 6.7 % (ref 0.0–7.0)

## 2021-08-07 LAB — RESP PANEL BY RT-PCR (FLU A&B, COVID) ARPGX2
Influenza A by PCR: NEGATIVE
Influenza B by PCR: NEGATIVE
SARS Coronavirus 2 by RT PCR: NEGATIVE

## 2021-08-07 LAB — PREPARE RBC (CROSSMATCH)

## 2021-08-07 LAB — FOLATE: Folate: 29.2 ng/mL (ref >5.9)

## 2021-08-07 LAB — POCT HEMOGLOBIN: Hemoglobin: 6.3 g/dL — AB (ref 11–14.6)

## 2021-08-07 LAB — POC OCCULT BLOOD, ED: Fecal Occult Bld: NEGATIVE

## 2021-08-07 LAB — IRON AND TIBC
Iron: 13 ug/dL — ABNORMAL LOW (ref 28–170)
Saturation Ratios: 3 % — ABNORMAL LOW (ref 10.4–31.8)
TIBC: 374 ug/dL (ref 250–450)
UIBC: 361 ug/dL

## 2021-08-07 LAB — VITAMIN B12: Vitamin B-12: 865 pg/mL (ref 180–914)

## 2021-08-07 LAB — FERRITIN: Ferritin: 7 ng/mL — ABNORMAL LOW (ref 11–307)

## 2021-08-07 LAB — ABO/RH: ABO/RH(D): A POS

## 2021-08-07 MED ORDER — ALBUTEROL SULFATE (2.5 MG/3ML) 0.083% IN NEBU: 3.0000 mL | INHALATION_SOLUTION | RESPIRATORY_TRACT | Status: DC | PRN

## 2021-08-07 MED ORDER — MAGNESIUM SULFATE IN D5W 1-5 GM/100ML-% IV SOLN
1.0000 g | Freq: Once | INTRAVENOUS | Status: AC
  Administered 2021-08-07: 1 g via INTRAVENOUS
  Filled 2021-08-07: qty 100

## 2021-08-07 MED ORDER — INFLUENZA VAC A&B SA ADJ QUAD 0.5 ML IM PRSY
0.5000 mL | PREFILLED_SYRINGE | INTRAMUSCULAR | Status: AC
  Administered 2021-08-08: 0.5 mL via INTRAMUSCULAR
  Filled 2021-08-07: qty 0.5

## 2021-08-07 MED ORDER — ZOLPIDEM TARTRATE 5 MG PO TABS: 5.0000 mg | ORAL_TABLET | Freq: Every evening | ORAL | Status: DC | PRN

## 2021-08-07 MED ORDER — GABAPENTIN 300 MG PO CAPS
800.0000 mg | ORAL_CAPSULE | Freq: Two times a day (BID) | ORAL | Status: DC
  Administered 2021-08-07 – 2021-08-08 (×2): 800 mg via ORAL
  Filled 2021-08-07 (×2): qty 2

## 2021-08-07 MED ORDER — BACLOFEN 10 MG PO TABS
20.0000 mg | ORAL_TABLET | Freq: Every day | ORAL | Status: DC
  Administered 2021-08-07: 20 mg via ORAL
  Filled 2021-08-07: qty 2

## 2021-08-07 MED ORDER — FLUOXETINE HCL 20 MG PO CAPS
20.0000 mg | ORAL_CAPSULE | Freq: Every day | ORAL | Status: DC
  Administered 2021-08-08: 20 mg via ORAL
  Filled 2021-08-07: qty 1

## 2021-08-07 MED ORDER — INSULIN ASPART 100 UNIT/ML IJ SOLN
0.0000 [IU] | INTRAMUSCULAR | Status: DC
  Filled 2021-08-07: qty 0.06

## 2021-08-07 MED ORDER — ACETAMINOPHEN 325 MG PO TABS: 650.0000 mg | ORAL_TABLET | Freq: Four times a day (QID) | ORAL | Status: DC | PRN

## 2021-08-07 MED ORDER — SODIUM CHLORIDE 0.9 % IV SOLN
10.0000 mL/h | Freq: Once | INTRAVENOUS | Status: AC
  Administered 2021-08-07: 10 mL/h via INTRAVENOUS

## 2021-08-07 MED ORDER — ONDANSETRON HCL 4 MG PO TABS: 4.0000 mg | ORAL_TABLET | Freq: Four times a day (QID) | ORAL | Status: DC | PRN

## 2021-08-07 MED ORDER — POTASSIUM CHLORIDE 10 MEQ/100ML IV SOLN
10.0000 meq | Freq: Once | INTRAVENOUS | Status: AC
  Administered 2021-08-07: 10 meq via INTRAVENOUS
  Filled 2021-08-07: qty 100

## 2021-08-07 MED ORDER — MONTELUKAST SODIUM 10 MG PO TABS
10.0000 mg | ORAL_TABLET | Freq: Every day | ORAL | Status: DC
  Administered 2021-08-07: 10 mg via ORAL
  Filled 2021-08-07: qty 1

## 2021-08-07 MED ORDER — DULOXETINE HCL 30 MG PO CPEP
60.0000 mg | ORAL_CAPSULE | Freq: Every day | ORAL | Status: DC
  Administered 2021-08-08: 60 mg via ORAL
  Filled 2021-08-07: qty 2

## 2021-08-07 MED ORDER — ACETAMINOPHEN 650 MG RE SUPP: 650.0000 mg | Freq: Four times a day (QID) | RECTAL | Status: DC | PRN

## 2021-08-07 MED ORDER — CLONAZEPAM 0.5 MG PO TABS
1.0000 mg | ORAL_TABLET | Freq: Two times a day (BID) | ORAL | Status: DC
  Administered 2021-08-07: 1 mg via ORAL
  Filled 2021-08-07 (×2): qty 2

## 2021-08-07 MED ORDER — POTASSIUM CHLORIDE CRYS ER 20 MEQ PO TBCR
40.0000 meq | EXTENDED_RELEASE_TABLET | Freq: Once | ORAL | Status: AC
  Administered 2021-08-07: 40 meq via ORAL
  Filled 2021-08-07: qty 2

## 2021-08-07 MED ORDER — ONDANSETRON HCL 4 MG/2ML IJ SOLN: 4.0000 mg | Freq: Four times a day (QID) | INTRAMUSCULAR | Status: DC | PRN

## 2021-08-07 MED ORDER — ATORVASTATIN CALCIUM 40 MG PO TABS
40.0000 mg | ORAL_TABLET | Freq: Every day | ORAL | Status: DC
  Administered 2021-08-08: 40 mg via ORAL
  Filled 2021-08-07: qty 1

## 2021-08-07 MED ORDER — QUETIAPINE FUMARATE 300 MG PO TABS
400.0000 mg | ORAL_TABLET | Freq: Every day | ORAL | Status: DC
  Administered 2021-08-07: 400 mg via ORAL
  Filled 2021-08-07: qty 1

## 2021-08-07 NOTE — ED Notes (Addendum)
Rounded on pt. Pt currently denies any complaints and did not need any further assistance at this time.  ?

## 2021-08-07 NOTE — ED Provider Notes (Signed)
Emergency Medicine Provider Triage Evaluation Note  Kelly Shannon , a 70 y.o. female  was evaluated in triage.  Pt complains of anemia.  Patient states that last week she had a syncopal episode.  She notes that she was walking to her kitchen and having a refrigerator and briefly lost consciousness.  She states that after this occurred she was feeling normal.  States that she was evaluated by her PCP today and was found to have a low hemoglobin and was told to come to the emergency department for further evaluation.  Does note a history of anemia but states that she has never required a transfusion and does not take ferrous sulfate.  Denies fatigue, dizziness, nausea, vomiting, additional syncopal episodes, bloody stools, melena.  Denies regular NSAID use or alcohol use.  Patient has a history of diverticulitis and does note that she was having left lower abdominal pain last week which she states has mostly resolved.  Physical Exam  BP (!) 173/72 (BP Location: Left Arm)   Pulse 98   Resp 18   Ht 5\' 3"  (1.6 m)   Wt 72.6 kg   SpO2 95%   BMI 28.34 kg/m  Gen:   Awake, no distress   Resp:  Normal effort  MSK:   Moves extremities without difficulty  Other:    Medical Decision Making  Medically screening exam initiated at 5:08 PM.  Appropriate orders placed.  Kelly Shannon was informed that the remainder of the evaluation will be completed by another provider, this initial triage assessment does not replace that evaluation, and the importance of remaining in the ED until their evaluation is complete.   Hilliard Clark, PA-C 08/07/21 1710    10/07/21, MD 08/07/21 2148

## 2021-08-07 NOTE — Progress Notes (Signed)
    SUBJECTIVE:   CHIEF COMPLAINT / HPI: f/u anemia  Anemia: In June 2022 patient had normal hgb 12. She was seen at [facility] on 9/29 for dizziness, hgb 6.4. Patient reports she had nausea, vomiting, and diarrhea (no blood in emesis or Bms). She was feeling nauseated and went to get a ginger ale and on her way to get the drink, she passed out suddenly in the kitchen. Since then the vomiting and diarrhea have resolved. Denies fevers, chills, SOB, hematuria, etc. Patient reports no melena, hematochezia. Today on POC testing she had hgb 5.3 and 6.3.  PERTINENT  PMH / PSH: anemia  OBJECTIVE:   BP 133/68   Pulse 89   Ht 5\' 3"  (1.6 m)   Wt 161 lb (73 kg)   SpO2 98%   BMI 28.52 kg/m   Nursing note and vitals reviewed GEN: very pale woman, ill-appearing, resting comfortably in chair, NAD, WNWD HEENT: NCAT. PERRLA. Sclera without injection or icterus. MMM.  Cardiac: Regular rate and rhythm. Normal S1/S2. No murmurs, rubs, or gallops appreciated. 2+ radial pulses. Lungs: Clear bilaterally to ascultation. No increased WOB, no accessory muscle usage. No w/r/r. Neuro: AOx3  Ext: no edema Psych: Pleasant and appropriate   ASSESSMENT/PLAN:   Anemia Patient with symptomatic anemia resulting in significant anemia to 6.3, stable from last week. Does not appear to be acute bleed, but suspect GI tract given age. Patient requires blood transfusion and accelerated work up. Recommend going to ED now for transfusion, will need CT of abdomen/pelvis. Notified inpatient [facility]PT[facility] hospital team in case of admission, also notified ED charge RN. Patient agreeable with plan, precepted with Dr. .     Leveda Anna, MD [facility]

## 2021-08-07 NOTE — ED Provider Notes (Addendum)
[facility]-EMERGENCY DEPT Provider Note   CSN: [id number] Arrival date & time: 08/07/21  1651     History Chief Complaint  Patient presents with   Abnormal Lab    Kelly Shannon is a 70 y.o. female.  Patient seen for passing out episode on temper 29th.  At urgent care.  Had blood work done there that showed an anemia.  Patient seen by [facility] family practice today.  Referred in for admission think patient may be was supposed to go to the [facility] ED.  Hemoglobin here today is 5.9.  Patient denies any blood in her bowel movements or any black-colored or bowel movements.  States she has had anemia long-term.  States that on September 29 they treated her with antibiotics for diverticulitis.  Patient did not have a CT scan.  Patient had a CT scan last in 2012 with did not show any diverticulosis at that time.  Patient's platelet counts are normal.  Patient is not on any blood thinners.  Past medical history is significant for asthma diabetes hyperlipidemia hypertension.  Also electrolytes as significant for potassium of 2.9.  Glucose 149.  CO2 is 23.  Renal function is normal.  Liver function test are normal.  Patient denies any symptoms currently.  No further feeling as if she is going to pass out or feeling lightheaded.  Patient is very pale in appearance.      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   Fibromyalgia 12/19/2015   HLD (hyperlipidemia) 12/19/2015   Hypertension 12/19/2015   Cannot sleep 12/19/2015   Lumbar  radiculopathy 12/19/2015   Polypharmacy 11/29/2015   Chronic ethmoidal sinusitis 10/01/2015   Antritis chronic 09/18/2015   Anemia 01/18/2015   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   Cardiac murmur 03/08/2012   Chronic neck pain 02/10/2012   Chronic elbow pain 02/10/2012   Bilateral chronic knee pain 02/10/2012   Chronic hand pain 02/10/2012   Chronic ankle pain 02/10/2012   Chronic pain 02/10/2012   Allergic rhinitis 09/22/2011   Bipolar affective disorder (HCC) 09/22/2011   Hot flash, menopausal 09/22/2011   Menopausal and perimenopausal disorder 09/15/2011   Bipolar affective disorder, depressed, severe, with psychotic behavior (HCC) 08/11/2011   Type 2 diabetes mellitus (HCC) 08/11/2011   Constipation 02/16/2010   Disturbance in sleep behavior 06/16/2009    Past Surgical History:  Procedure Laterality Date   ABDOMINAL HYSTERECTOMY       OB History   No obstetric history on file.     Family History  Problem Relation Age of Onset   Diabetes Mother    Heart disease Mother    Heart disease Father     Social History   Tobacco Use   Smoking status: Never   Smokeless tobacco: Never  Substance Use Topics   Alcohol use: No   Drug use: No    Home Medications Prior to Admission medications   Medication Sig Start Date End Date Taking? Authorizing Provider  albuterol (VENTOLIN HFA) 108 (90 Base) MCG/ACT inhaler  INHALE 2 PUFFS BY MOUTH INTO THE LUNGS EVERY 4 HOURS AS NEEDED FOR WHEEZING OR SHORTNESS OF BREATH. 06/25/20   Mirian Mo, MD  atorvastatin (LIPITOR) 40 MG tablet TAKE 1 TABLET BY MOUTH DAILY. 04/10/21 04/10/22  Mirian Mo, MD  baclofen (LIORESAL) 10 MG tablet Take 2 tablets (20 mg total) by mouth at bedtime. 04/28/21   Mirian Mo, MD  ciprofloxacin (CIPRO) 500 MG tablet Take 1 tablet (500 mg total) by mouth every 12 (twelve) hours for 7 days. 07/31/21 08/07/21  Wieters, Hallie C, PA-C  clonazePAM (KLONOPIN) 0.5 MG tablet TAKE 1  TABLET BY MOUTH TWICE DAILY. DUE 08/26/20. (1/3) 08/26/20 02/22/21  McDonald, Eber Jones D  clonazePAM (KLONOPIN) 0.5 MG tablet Take 1 tablet (0.5 mg total) by mouth 2 (two) times daily. ( 2 of 3) 04/09/21     clonazePAM (KLONOPIN) 0.5 MG tablet Take 1 tablet (0.5 mg total) by mouth 2 (two) times daily.(1 of 3) 04/09/21     clonazePAM (KLONOPIN) 0.5 MG tablet Take 1 tablet (0.5 mg total) by mouth 2 (two) times daily. (3 of 3) 09/30/21     clonazePAM (KLONOPIN) 0.5 MG tablet Take one tablet bid ( 2 of 3 ) 07/17/21     clonazePAM (KLONOPIN) 0.5 MG tablet Take 1 tablet (0.5 mg total) by mouth 2 (two) times daily. 08/01/21     diclofenac Sodium (VOLTAREN) 1 % GEL Apply 2 g topically 4 (four) times daily. 07/31/21   Wieters, Hallie C, PA-C  dicyclomine (BENTYL) 10 MG capsule TAKE 1 CAPSULE BY MOUTH 3 TIMES DAILY AS NEEDED FOR SPASMS. 02/20/21   Mirian Mo, MD  DULoxetine (CYMBALTA) 60 MG capsule TAKE 1 CAPSULE (60 MG TOTAL) BY MOUTH 2 TIMES DAILY. 02/07/20 02/06/21  Barbette Merino D  DULoxetine (CYMBALTA) 60 MG capsule Take 2 capsules (120 mg total) by mouth daily. 04/09/21     eszopiclone (LUNESTA) 2 MG TABS tablet TAKE 1 TABLET (2 MG TOTAL) BY MOUTH NIGHTLY. TAKE IMMEDIATELY BEFORE BEDTIME 12/31/20     eszopiclone (LUNESTA) 2 MG TABS tablet Take 1 tablet (2 mg total) by mouth nightly. Take immediately before bedtime 04/09/21     Eszopiclone 3 MG TABS Take 1 tablet (3 mg total) by mouth every evening. 07/17/21     ferrous sulfate 324 MG TBEC Take 1 tablet (324 mg total) by mouth every other day. 01/01/21   Mirian Mo, MD  FLUoxetine (PROZAC) 20 MG capsule TAKE 1 CAPSULE (20 MG TOTAL) BY MOUTH DAILY. 12/31/20 12/31/21  Barbette Merino D  FLUoxetine (PROZAC) 20 MG capsule Take 1 capsule (20 mg total) by mouth daily. 04/09/21     FLUoxetine (PROZAC) 20 MG capsule Take 1 capsule (20 mg total) by mouth daily. 07/17/21     fluticasone (FLONASE) 50 MCG/ACT nasal spray PLACE 2 SPRAYS INTO BOTH NOSTRILS DAILY. 11/28/20 11/28/21  Dana Allan, MD  furosemide (LASIX) 40 MG tablet Take 1 tablet (40 mg total) by mouth daily. 08/02/20   Mirian Mo, MD  gabapentin (NEURONTIN) 400 MG capsule Take 2 capsules (800 mg total) by mouth 2 (two) times daily. 07/23/21   Levin Erp, MD  Galcanezumab-gnlm 120 MG/ML SOAJ INJECT 1 ML (120 MG TOTAL) INTO THE SKIN EVERY 30 DAYS. 08/12/20 08/12/21  Iven Finn., MD  glucose blood test strip USE TO CHECK BLOOD SUGAR DAILY IN THE MORNING 08/19/20 08/19/21  Mirian Mo, MD  Lifitegrast 5 % SOLN PLACE 1 DROP IN BOTH EYES 2 TIMES DAILY 11/14/20 11/14/21  Poudyal, Ritesh, OD  linaclotide (LINZESS) 72 MCG capsule Take 1 capsule (72 mcg total) by mouth daily before breakfast. 03/18/21   Mirian Mo, MD  losartan (COZAAR) 25 MG tablet TAKE 1 TABLET BY MOUTH DAILY. 05/17/18   Tillman Sers, DO  metFORMIN (GLUCOPHAGE-XR) 500 MG 24 hr tablet Take 2 tablets (1,000 mg total) by mouth in the morning and at bedtime. 05/30/21   Bess Kinds, MD  metroNIDAZOLE (FLAGYL) 500 MG tablet Take 1 tablet (500 mg total) by mouth 2 (two) times daily for 7 days. 07/31/21 08/07/21  Wieters, Hallie C, PA-C  montelukast (SINGULAIR) 10 MG tablet Take 1 tablet (10 mg total) by mouth at bedtime. 06/30/21   Levin Erp, MD  ondansetron (ZOFRAN ODT) 4 MG disintegrating tablet Take 1 tablet (4 mg total) by mouth every 8 (eight) hours as needed for nausea or vomiting. 07/31/21   Wieters, Hallie C, PA-C  prochlorperazine (COMPAZINE) 5 MG tablet TAKE 1 TABLET (5 MG TOTAL) BY MOUTH EVERY 6 (SIX) HOURS AS NEEDED FOR NAUSEA OR VOMITING. 01/16/21 01/16/22  Mirian Mo, MD  QUEtiapine (SEROQUEL) 400 MG tablet Take 1 tablet (400 mg total) by mouth nightly. 07/17/21     rizatriptan (MAXALT) 10 MG tablet Take 1 tablet (10 mg total) by mouth as needed for migraine. May repeat in 2 hours if needed 04/02/21   Mirian Mo, MD  topiramate (TOPAMAX) 100 MG tablet TAKE 1 TABLET (100 MG TOTAL) BY MOUTH 2 (TWO) TIMES DAILY. 01/16/21 01/16/22  Mirian Mo, MD    Allergies    Lyrica [pregabalin], Peanut oil, Penicillins, Sulfa antibiotics, Sulfamethoxazole-trimethoprim, and Lisinopril  Review of Systems   Review of Systems  Constitutional:  Negative for chills and fever.  HENT:  Negative for ear pain and sore throat.   Eyes:  Negative for pain and visual disturbance.  Respiratory:  Negative for cough and shortness of breath.   Cardiovascular:  Negative for chest pain and palpitations.  Gastrointestinal:  Negative for abdominal pain, blood in stool, nausea and vomiting.  Genitourinary:  Negative for dysuria and hematuria.  Musculoskeletal:  Negative for arthralgias and back pain.  Skin:  Negative for color change and rash.  Neurological:  Negative for seizures and syncope.  Hematological:  Does not bruise/bleed easily.  All other systems reviewed and are negative.  Physical Exam Updated Vital Signs BP (!) 161/79   Pulse 90   Temp 98.7 F (37.1 C) (Oral)   Resp 16   Ht 1.6 m (5\' 3" )   Wt 72.6 kg   SpO2 (!) 89%   BMI 28.34 kg/m   Physical Exam Vitals and nursing note reviewed.  Constitutional:      General: She is not in acute distress.    Appearance: Normal appearance. She is well-developed.  HENT:     Head: Normocephalic and atraumatic.  Eyes:     Extraocular Movements: Extraocular movements intact.     Conjunctiva/sclera: Conjunctivae normal.     Pupils: Pupils are equal, round, and reactive to light.  Cardiovascular:     Rate and Rhythm: Normal rate and regular rhythm.     Heart sounds: No murmur heard. Pulmonary:     Effort: Pulmonary effort is normal. No respiratory distress.     Breath sounds: Normal breath sounds.  Abdominal:     Palpations: Abdomen is soft.     Tenderness: There is no abdominal tenderness.  Genitourinary:    Comments: Rectal exam really with no significant stool in the vault no gross blood.  Hemoccult sent  to the lab.  But again really no stool. Musculoskeletal:     Cervical back:  Neck supple.  Skin:    General: Skin is warm and dry.     Coloration: Skin is pale.  Neurological:     General: No focal deficit present.     Mental Status: She is alert and oriented to person, place, and time.     Cranial Nerves: No cranial nerve deficit.     Sensory: No sensory deficit.    ED Results / Procedures / Treatments   Labs (all labs ordered are listed, but only abnormal results are displayed) Labs Reviewed  CBC WITH DIFFERENTIAL/PLATELET - Abnormal; Notable for the following components:      Result Value   RBC 2.26 (*)    Hemoglobin 5.9 (*)    HCT 19.7 (*)    MCHC 29.9 (*)    RDW 18.7 (*)    Platelets 481 (*)    nRBC 0.3 (*)    Abs Immature Granulocytes 0.10 (*)    All other components within normal limits  COMPREHENSIVE METABOLIC PANEL - Abnormal; Notable for the following components:   Potassium 2.9 (*)    Glucose, Bld 149 (*)    Calcium 8.5 (*)    All other components within normal limits  IRON AND TIBC - Abnormal; Notable for the following components:   Iron 13 (*)    Saturation Ratios 3 (*)    All other components within normal limits  FERRITIN - Abnormal; Notable for the following components:   Ferritin 7 (*)    All other components within normal limits  RETICULOCYTES - Abnormal; Notable for the following components:   Retic Ct Pct 3.6 (*)    RBC. 2.24 (*)    All other components within normal limits  VITAMIN B12  FOLATE  TYPE AND SCREEN  PREPARE RBC (CROSSMATCH)    EKG EKG Interpretation  Date/Time:  Thursday August 07 2021 17:43:30 EDT Ventricular Rate:  86 PR Interval:  145 QRS Duration: 83 QT Interval:  366 QTC Calculation: 438 R Axis:   56 Text Interpretation: Sinus rhythm Confirmed by Vanetta Mulders [id number]) on 08/07/2021 8:15:05 PM  Radiology No results found.  Procedures Procedures   Medications Ordered in ED Medications  0.9 %  sodium chloride infusion (has no administration in time range)    ED Course  I have  reviewed the triage vital signs and the nursing notes.  Pertinent labs & imaging results that were available during my care of the patient were reviewed by me and considered in my medical decision making (see chart for details).    MDM Rules/Calculators/A&P                         CRITICAL CARE Performed by: Vanetta Mulders Total critical care time: 45 minutes Critical care time was exclusive of separately billable procedures and treating other patients. Critical care was necessary to treat or prevent imminent or life-threatening deterioration. Critical care was time spent personally by me on the following activities: development of treatment plan with patient and/or surrogate as well as nursing, discussions with consultants, evaluation of patient's response to treatment, examination of patient, obtaining history from patient or surrogate, ordering and performing treatments and interventions, ordering and review of laboratory studies, ordering and review of radiographic studies, pulse oximetry and re-evaluation of patient's condition.   Patient's vital signs stable.  Not tachycardic not hypotensive.  Oxygen saturations on room air 100%.  Patient afebrile.  Patient is very pale in appearance.  Abdomen soft and nontender.  Patient with significant anemia with hemoglobin 5.9.  Platelets normal.  Patient potassium is low at 2.9.  We will give some IV potassium and some oral potassium.  Have ordered 2 units of packed red blood cells for transfusion and will arrange admission.  Nurses getting Hemoccult.   Final Clinical Impression(s) / ED Diagnoses Final diagnoses:  Hypokalemia  Anemia, unspecified type    Rx / DC Orders ED Discharge Orders     None        Vanetta Mulders, MD 08/07/21 2033    Vanetta Mulders, MD 08/07/21 [id number]    Vanetta Mulders, MD 08/07/21 2144

## 2021-08-07 NOTE — H&P (Signed)
**Note De-Shannon via Obfuscation** History and Physical    [name]:[id number] DOB: Jul 24, 1951 DOA: 08/07/2021  PCP: Levin Erp, MD   Patient coming from: home   Chief Complaint: Recent diverticulitis and syncope, anemia   HPI: [name] is a 70 y.o. female with medical history significant for asthma, type 2 diabetes mellitus, hypertension, bipolar affective disorder, obsessive-compulsive disorder, and anxiety, now presenting to emergency department for evaluation of anemia.  Patient reports that she had right lower quadrant abdominal pain with nausea, nonbloody vomiting, and diarrhea approximately a week ago, had a syncopal episode upon standing on 07/30/2021, and was evaluated at an urgent care where she was found to be anemic.  She was started on antibiotics for suspected diverticulitis at the urgent care, completed the course today, and reports that the right lower abdominal pain, nausea, and diarrhea have been resolved.  She denies any further lightheadedness on standing.  She reports prior episodes of the left lower quadrant pain that were suspected to reflect diverticulitis.  She never had a colonoscopy, explaining that she was busy with a new grandchild when it was recommended.  She has history of anemia, is prescribed iron supplements, but does not take them.  She has not noticed any melena or hematochezia.  She denies any recent fevers, chills, and no abdominal pain in the past couple days.  She was seen in her primary care clinic with persistent anemia and directed to the ED for further evaluation and management.  ED Course: Upon arrival to the ED, patient is found to be afebrile with normal heart rate and stable blood pressure.  EKG was sinus rhythm.  Chemistry panel with potassium 2.9.  CBC notable for hemoglobin 5.9, platelets 1 81,000, and normal MCV.  Fecal occult blood testing is negative.  Patient was given oral and IV potassium, and 2 units of RBC were ordered for transfusion.  Review of  Systems:  All other systems reviewed and apart from HPI, are negative.  Past Medical History:  Diagnosis Date   Asthma    Diabetes mellitus    Hyperlipidemia    Hypertension     Past Surgical History:  Procedure Laterality Date   ABDOMINAL HYSTERECTOMY      Social History:   reports that she has never smoked. She has never used smokeless tobacco. She reports that she does not drink alcohol and does not use drugs.  Allergies  Allergen Reactions   Lyrica [Pregabalin] Swelling   Other Other (See Comments)    Peanut - unknown   Peanut Oil Other (See Comments)    Tongue feel funny   Penicillins Other (See Comments)    Dental problems   Sulfa Antibiotics    Sulfamethoxazole-Trimethoprim Hives   Lisinopril Cough    Family History  Problem Relation Age of Onset   Diabetes Mother    Heart disease Mother    Heart disease Father      Prior to Admission medications   Medication Sig Start Date End Date Taking? Authorizing Provider  albuterol (VENTOLIN HFA) 108 (90 Base) MCG/ACT inhaler INHALE 2 PUFFS BY MOUTH INTO THE LUNGS EVERY 4 HOURS AS NEEDED FOR WHEEZING OR SHORTNESS OF BREATH. Patient taking differently: Inhale 2 puffs into the lungs every 4 (four) hours as needed for wheezing. 06/25/20  Yes Mirian Mo, MD  atorvastatin (LIPITOR) 40 MG tablet TAKE 1 TABLET BY MOUTH DAILY. Patient taking differently: Take 40 mg by mouth daily. 04/10/21 04/10/22 Yes Mirian Mo, MD  baclofen (LIORESAL) 10 MG tablet Take  2 tablets (20 mg total) by mouth at bedtime. 04/28/21  Yes Mirian Mo, MD  clonazePAM (KLONOPIN) 0.5 MG tablet Take 1 tablet (0.5 mg total) by mouth 2 (two) times daily. ( 2 of 3) Patient taking differently: Take 1 mg by mouth 2 (two) times daily. 04/09/21  Yes   DULoxetine (CYMBALTA) 60 MG capsule Take 2 capsules (120 mg total) by mouth daily. Patient taking differently: Take 60 mg by mouth daily. 04/09/21  Yes   Eszopiclone 3 MG TABS Take 1 tablet (3 mg total) by mouth every  evening. 07/17/21  Yes   FLUoxetine (PROZAC) 20 MG capsule Take 1 capsule (20 mg total) by mouth daily. 07/17/21  Yes   fluticasone (FLONASE) 50 MCG/ACT nasal spray PLACE 2 SPRAYS INTO BOTH NOSTRILS DAILY. Patient taking differently: Place 2 sprays into both nostrils daily as needed for allergies. 11/28/20 11/28/21 Yes Dana Allan, MD  gabapentin (NEURONTIN) 400 MG capsule Take 2 capsules (800 mg total) by mouth 2 (two) times daily. 07/23/21  Yes Levin Erp, MD  Galcanezumab-gnlm 120 MG/ML SOAJ INJECT 1 ML (120 MG TOTAL) INTO THE SKIN EVERY 30 DAYS. Patient taking differently: Inject 120 mg into the skin every 30 (thirty) days. 08/12/20 08/12/21 Yes Iven Finn., MD  metFORMIN (GLUCOPHAGE-XR) 500 MG 24 hr tablet Take 2 tablets (1,000 mg total) by mouth in the morning and at bedtime. 05/30/21  Yes Sowell, Apolinar Junes, MD  montelukast (SINGULAIR) 10 MG tablet Take 1 tablet (10 mg total) by mouth at bedtime. 06/30/21  Yes Levin Erp, MD  QUEtiapine (SEROQUEL) 400 MG tablet Take 1 tablet (400 mg total) by mouth nightly. 07/17/21  Yes   rizatriptan (MAXALT) 10 MG tablet Take 1 tablet (10 mg total) by mouth as needed for migraine. May repeat in 2 hours if needed Patient taking differently: Take 10 mg by mouth daily as needed for migraine. May repeat in 2 hours if needed 04/02/21  Yes Mirian Mo, MD  ciprofloxacin (CIPRO) 500 MG tablet Take 1 tablet (500 mg total) by mouth every 12 (twelve) hours for 7 days. 07/31/21 08/07/21  Wieters, Hallie C, PA-C  clonazePAM (KLONOPIN) 0.5 MG tablet TAKE 1 TABLET BY MOUTH TWICE DAILY. DUE 08/26/20. (1/3) Patient not taking: No sig reported 08/26/20 08/07/21  Barbette Merino D  clonazePAM (KLONOPIN) 0.5 MG tablet Take 1 tablet (0.5 mg total) by mouth 2 (two) times daily.(1 of 3) Patient not taking: No sig reported 04/09/21     clonazePAM (KLONOPIN) 0.5 MG tablet Take 1 tablet (0.5 mg total) by mouth 2 (two) times daily. (3 of 3) Patient not taking: No sig reported  09/30/21     clonazePAM (KLONOPIN) 0.5 MG tablet Take one tablet bid ( 2 of 3 ) Patient not taking: No sig reported 07/17/21     clonazePAM (KLONOPIN) 0.5 MG tablet Take 1 tablet (0.5 mg total) by mouth 2 (two) times daily. Patient not taking: No sig reported 08/01/21     diclofenac Sodium (VOLTAREN) 1 % GEL Apply 2 g topically 4 (four) times daily. Patient not taking: No sig reported 07/31/21   Wieters, Hallie C, PA-C  dicyclomine (BENTYL) 10 MG capsule TAKE 1 CAPSULE BY MOUTH 3 TIMES DAILY AS NEEDED FOR SPASMS. Patient not taking: No sig reported 02/20/21   Mirian Mo, MD  DULoxetine (CYMBALTA) 60 MG capsule TAKE 1 CAPSULE (60 MG TOTAL) BY MOUTH 2 TIMES DAILY. Patient not taking: Reported on 08/07/2021 02/07/20 02/06/21  Barbette Merino D  eszopiclone (LUNESTA) 2 MG TABS tablet  TAKE 1 TABLET (2 MG TOTAL) BY MOUTH NIGHTLY. TAKE IMMEDIATELY BEFORE BEDTIME Patient not taking: No sig reported 12/31/20     eszopiclone (LUNESTA) 2 MG TABS tablet Take 1 tablet (2 mg total) by mouth nightly. Take immediately before bedtime Patient not taking: No sig reported 04/09/21     ferrous sulfate 324 MG TBEC Take 1 tablet (324 mg total) by mouth every other day. Patient not taking: No sig reported 01/01/21   Mirian Mo, MD  FLUoxetine (PROZAC) 20 MG capsule TAKE 1 CAPSULE (20 MG TOTAL) BY MOUTH DAILY. Patient not taking: No sig reported 12/31/20 12/31/21  Barbette Merino D  FLUoxetine (PROZAC) 20 MG capsule Take 1 capsule (20 mg total) by mouth daily. Patient not taking: No sig reported 04/09/21     furosemide (LASIX) 40 MG tablet Take 1 tablet (40 mg total) by mouth daily. Patient not taking: No sig reported 08/02/20   Mirian Mo, MD  glucose blood test strip USE TO CHECK BLOOD SUGAR DAILY IN THE MORNING 08/19/20 08/19/21  Mirian Mo, MD  Lifitegrast 5 % SOLN PLACE 1 DROP IN BOTH EYES 2 TIMES DAILY Patient not taking: No sig reported 11/14/20 11/14/21  Poudyal, Ritesh, OD  linaclotide (LINZESS) 72 MCG capsule Take 1  capsule (72 mcg total) by mouth daily before breakfast. Patient not taking: No sig reported 03/18/21   Mirian Mo, MD  losartan (COZAAR) 25 MG tablet TAKE 1 TABLET BY MOUTH DAILY. Patient not taking: No sig reported 05/17/18   Dolores Patty C, DO  metroNIDAZOLE (FLAGYL) 500 MG tablet Take 1 tablet (500 mg total) by mouth 2 (two) times daily for 7 days. Patient not taking: No sig reported 07/31/21 08/07/21  Wieters, Hallie C, PA-C  ondansetron (ZOFRAN ODT) 4 MG disintegrating tablet Take 1 tablet (4 mg total) by mouth every 8 (eight) hours as needed for nausea or vomiting. Patient not taking: No sig reported 07/31/21   Wieters, Hallie C, PA-C  prochlorperazine (COMPAZINE) 5 MG tablet TAKE 1 TABLET (5 MG TOTAL) BY MOUTH EVERY 6 (SIX) HOURS AS NEEDED FOR NAUSEA OR VOMITING. Patient not taking: No sig reported 01/16/21 01/16/22  Mirian Mo, MD  topiramate (TOPAMAX) 100 MG tablet TAKE 1 TABLET (100 MG TOTAL) BY MOUTH 2 (TWO) TIMES DAILY. Patient not taking: No sig reported 01/16/21 01/16/22  Mirian Mo, MD    Physical Exam: Vitals:   08/07/21 2045 08/07/21 2100 08/07/21 2115 08/07/21 2130  BP: (!) 152/126 (!) 144/72 (!) 158/70 (!) 163/73  Pulse: 88 88 86 86  Resp: 20 19 19  (!) 21  Temp:      TempSrc:      SpO2: 99% 98% 98% 97%  Weight:      Height:         Constitutional: NAD, calm, pale  Eyes: PERTLA, lids and conjunctivae normal ENMT: Mucous membranes are moist. Posterior pharynx clear of any exudate or lesions.   Neck:  supple, no masses  Respiratory: no wheezing, no crackles. No accessory muscle use.  Cardiovascular: S1 & S2 heard, regular rate and rhythm. No extremity edema.   Abdomen: No distension, no tenderness, soft. Bowel sounds active.  Musculoskeletal: no clubbing / cyanosis. No joint deformity upper and lower extremities.   Skin: no significant rashes, lesions, ulcers. Warm, dry, well-perfused. Pale.  Neurologic: CN 2-12 grossly intact. Moving all extremities. Alert and  oriented.  Psychiatric: Pleasant. Cooperative.    Labs and Imaging on Admission: I have personally reviewed following labs and imaging studies  CBC:  Recent Labs  Lab 08/07/21 1617 08/07/21 1747  WBC  --  6.8  NEUTROABS  --  4.8  HGB 6.3* 5.9*  HCT  --  19.7*  MCV  --  87.2  PLT  --  481*   Basic Metabolic Panel: Recent Labs  Lab 08/07/21 1747  NA 135  K 2.9*  CL 102  CO2 23  GLUCOSE 149*  BUN 10  CREATININE 0.73  CALCIUM 8.5*   GFR: Estimated Creatinine Clearance: 64.3 mL/min (by C-G formula based on SCr of 0.73 mg/dL). Liver Function Tests: Recent Labs  Lab 08/07/21 1747  AST 23  ALT 21  ALKPHOS 111  BILITOT 0.3  PROT 6.9  ALBUMIN 3.5   No results for input(s): LIPASE, AMYLASE in the last 168 hours. No results for input(s): AMMONIA in the last 168 hours. Coagulation Profile: No results for input(s): INR, PROTIME in the last 168 hours. Cardiac Enzymes: No results for input(s): CKTOTAL, CKMB, CKMBINDEX, TROPONINI in the last 168 hours. BNP (last 3 results) No results for input(s): PROBNP in the last 8760 hours. HbA1C: Recent Labs    08/07/21 1617  HGBA1C 6.7   CBG: No results for input(s): GLUCAP in the last 168 hours. Lipid Profile: No results for input(s): CHOL, HDL, LDLCALC, TRIG, CHOLHDL, LDLDIRECT in the last 72 hours. Thyroid Function Tests: No results for input(s): TSH, T4TOTAL, FREET4, T3FREE, THYROIDAB in the last 72 hours. Anemia Panel: Recent Labs    08/07/21 1747  VITAMINB12 865  FOLATE 29.2  FERRITIN 7*  TIBC 374  IRON 13*  RETICCTPCT 3.6*   Urine analysis:    Component Value Date/Time   COLORURINE STRAW (A) 08/15/2020 1031   APPEARANCEUR CLEAR 08/15/2020 1031   LABSPEC 1.024 08/15/2020 1031   PHURINE 6.0 08/15/2020 1031   GLUCOSEU >=500 (A) 08/15/2020 1031   HGBUR NEGATIVE 08/15/2020 1031   BILIRUBINUR NEGATIVE 08/15/2020 1031   BILIRUBINUR negative 02/20/2020 1145   KETONESUR 5 (A) 08/15/2020 1031   PROTEINUR  NEGATIVE 08/15/2020 1031   UROBILINOGEN 0.2 02/20/2020 1145   NITRITE NEGATIVE 08/15/2020 1031   LEUKOCYTESUR NEGATIVE 08/15/2020 1031   Sepsis Labs: (procalcitonin:4,lacticidven:4) ) Recent Results (from the past 240 hour(s))  Resp Panel by RT-PCR (Flu A&B, Covid) Nasopharyngeal Swab     Status: Kelly   Collection Time: 08/07/21  8:59 PM   Specimen: Nasopharyngeal Swab; Nasopharyngeal(NP) swabs in vial transport medium  Result Value Ref Range Status   SARS Coronavirus 2 by RT PCR NEGATIVE NEGATIVE Final    Comment: (NOTE) SARS-CoV-2 target nucleic acids are NOT DETECTED.  The SARS-CoV-2 RNA is generally detectable in upper respiratory specimens during the acute phase of infection. The lowest concentration of SARS-CoV-2 viral copies this assay can detect is 138 copies/mL. A negative result does not preclude SARS-Cov-2 infection and should not be used as the sole basis for treatment or other patient management decisions. A negative result may occur with  improper specimen collection/handling, submission of specimen other than nasopharyngeal swab, presence of viral mutation(s) within the areas targeted by this assay, and inadequate number of viral copies(<138 copies/mL). A negative result must be combined with clinical observations, patient history, and epidemiological information. The expected result is Negative.  Fact Sheet for Patients:  BloggerCourse.com  Fact Sheet for Healthcare Providers:  SeriousBroker.it  This test is no t yet approved or cleared by the [place] FDA and  has been authorized for detection and/or diagnosis of SARS-CoV-2 by FDA under an Emergency Use Authorization (EUA). This EUA  will remain  in effect (meaning this test can be used) for the duration of the COVID-19 declaration under Section 564(b)(1) of the Act, 21 U.S.C.section 360bbb-3(b)(1), unless the authorization is terminated  or  revoked sooner.       Influenza A by PCR NEGATIVE NEGATIVE Final   Influenza B by PCR NEGATIVE NEGATIVE Final    Comment: (NOTE) The Xpert Xpress SARS-CoV-2/FLU/RSV plus assay is intended as an aid in the diagnosis of influenza from Nasopharyngeal swab specimens and should not be used as a sole basis for treatment. Nasal washings and aspirates are unacceptable for Xpert Xpress SARS-CoV-2/FLU/RSV testing.  Fact Sheet for Patients: BloggerCourse.com  Fact Sheet for Healthcare Providers: SeriousBroker.it  This test is not yet approved or cleared by the [place] FDA and has been authorized for detection and/or diagnosis of SARS-CoV-2 by FDA under an Emergency Use Authorization (EUA). This EUA will remain in effect (meaning this test can be used) for the duration of the COVID-19 declaration under Section 564(b)(1) of the Act, 21 U.S.C. section 360bbb-3(b)(1), unless the authorization is terminated or revoked.  Performed at [facility], [address], [place], [place] [address]      Radiological Exams on Admission: DG Chest Port 1 View  Result Date: 08/07/2021 CLINICAL DATA:  Syncopal episode. EXAM: PORTABLE CHEST 1 VIEW COMPARISON:  Kelly. FINDINGS: The heart size and mediastinal contours are within normal limits. There is moderate severity calcification of the aortic arch. Both lungs are clear. A round radiopaque calcification is seen overlying the medial aspect of the left lung base. The visualized skeletal structures are unremarkable. IMPRESSION: No active cardiopulmonary disease. Electronically Signed   By: Aram Candela M.D.   On: 08/07/2021 21:18    EKG: Independently reviewed. Sinus rhythm.   Assessment/Plan   1. Symptomatic anemia; IDA  - Presents for eval of anemia and found to have Hgb 5.9  - Labs consistent with IDA; she has iron on med list but not taking  - She never had colonoscopy  because she was busy when it was recommended but is agreeable to it now  - She denies melena or hematochezia and FOBT is negative  - 2 units RBC ordered from ED  - Check post-transfusion CBC, outpatient follow-up if remains stable   2. Type II DM  - A1c was 6.7% today  - Use low-intensity SSI if needed for now    3. GAD, OCD, bipolar affective disorder  - She is pleasant and calm on admission  - Continue home regimen   4. Hypokalemia  - Replaced orally and IV in ED  - Repeat chem panel in am    DVT prophylaxis: SCDs  Code Status: Full  Level of Care: Level of care: Telemetry Family Communication: Kelly present  Disposition Plan:  Patient is from: Home  Anticipated d/c is to: Home  Anticipated d/c date is: 08/08/21 Patient currently: Pending blood transfusion  Consults called: Kelly  Admission status: Observation     Briscoe Deutscher, MD [facility]  08/07/2021, 10:51 PM

## 2021-08-07 NOTE — ED Notes (Signed)
Called and spoke with blood bank. Blood will be ready in about 25-30 minutes.

## 2021-08-07 NOTE — ED Triage Notes (Signed)
Pt states last week she had a syncopal episode with N/V. Was seen by PCP today and reports a low hgb. Hx of anemia. Denies fatigue, dizziness, N/V, additional syncopal episode, bloody stools, hematuria.

## 2021-08-07 NOTE — Assessment & Plan Note (Signed)
Patient with symptomatic anemia resulting in significant anemia to 6.3, stable from last week. Does not appear to be acute bleed, but suspect GI tract given age. Patient requires blood transfusion and accelerated work up. Recommend going to ED now for transfusion, will need CT of abdomen/pelvis. Notified inpatient [facility] hospital team in case of admission, also notified ED charge RN. Patient agreeable with plan, precepted with Dr. Leveda Anna.

## 2021-08-08 DIAGNOSIS — Z23 Encounter for immunization: Secondary | ICD-10-CM | POA: Diagnosis not present

## 2021-08-08 DIAGNOSIS — Z91010 Allergy to peanuts: Secondary | ICD-10-CM | POA: Diagnosis not present

## 2021-08-08 DIAGNOSIS — Z20822 Contact with and (suspected) exposure to covid-19: Secondary | ICD-10-CM | POA: Diagnosis not present

## 2021-08-08 DIAGNOSIS — I10 Essential (primary) hypertension: Secondary | ICD-10-CM | POA: Diagnosis not present

## 2021-08-08 DIAGNOSIS — J45902 Unspecified asthma with status asthmaticus: Secondary | ICD-10-CM

## 2021-08-08 DIAGNOSIS — F319 Bipolar disorder, unspecified: Secondary | ICD-10-CM

## 2021-08-08 DIAGNOSIS — J45909 Unspecified asthma, uncomplicated: Secondary | ICD-10-CM | POA: Diagnosis not present

## 2021-08-08 DIAGNOSIS — D649 Anemia, unspecified: Secondary | ICD-10-CM | POA: Diagnosis not present

## 2021-08-08 DIAGNOSIS — E119 Type 2 diabetes mellitus without complications: Secondary | ICD-10-CM | POA: Diagnosis not present

## 2021-08-08 DIAGNOSIS — Z79899 Other long term (current) drug therapy: Secondary | ICD-10-CM | POA: Diagnosis not present

## 2021-08-08 DIAGNOSIS — E876 Hypokalemia: Secondary | ICD-10-CM | POA: Diagnosis not present

## 2021-08-08 DIAGNOSIS — Z7984 Long term (current) use of oral hypoglycemic drugs: Secondary | ICD-10-CM | POA: Diagnosis not present

## 2021-08-08 LAB — CBG MONITORING, ED
Glucose-Capillary: 135 mg/dL — ABNORMAL HIGH (ref 70–99)
Glucose-Capillary: 153 mg/dL — ABNORMAL HIGH (ref 70–99)
Glucose-Capillary: 156 mg/dL — ABNORMAL HIGH (ref 70–99)

## 2021-08-08 LAB — BASIC METABOLIC PANEL
Anion gap: 3 — ABNORMAL LOW (ref 5–15)
BUN: 7 mg/dL — ABNORMAL LOW (ref 8–23)
CO2: 23 mmol/L (ref 22–32)
Calcium: 8.3 mg/dL — ABNORMAL LOW (ref 8.9–10.3)
Chloride: 110 mmol/L (ref 98–111)
Creatinine, Ser: 0.6 mg/dL (ref 0.44–1.00)
GFR, Estimated: >60 mL/min (ref >60)
Glucose, Bld: 141 mg/dL — ABNORMAL HIGH (ref 70–99)
Potassium: 3.7 mmol/L (ref 3.5–5.1)
Sodium: 136 mmol/L (ref 135–145)

## 2021-08-08 LAB — HEMOGLOBIN AND HEMATOCRIT, BLOOD
HCT: 29.1 % — ABNORMAL LOW (ref 36.0–46.0)
Hemoglobin: 9.3 g/dL — ABNORMAL LOW (ref 12.0–15.0)

## 2021-08-08 LAB — MAGNESIUM: Magnesium: 1.9 mg/dL (ref 1.7–2.4)

## 2021-08-08 LAB — HIV ANTIBODY (ROUTINE TESTING W REFLEX): HIV Screen 4th Generation wRfx: NONREACTIVE

## 2021-08-08 NOTE — ED Notes (Signed)
Lunch tray given. 

## 2021-08-08 NOTE — ED Notes (Signed)
Pt up to restroom.

## 2021-08-08 NOTE — Discharge Summary (Addendum)
Physician Discharge Summary  Kelly Shannon ZOX:[id number] DOB: 1951/04/17 DOA: 08/07/2021  PCP: Levin Erp, MD  Admit date: 08/07/2021 Discharge date: 08/08/2021 Consultations: None  Admitted From: Home Disposition: Home  Discharge Diagnoses:  Principal Problem:   Symptomatic anemia Active Problems:   Type 2 diabetes mellitus (HCC) Hypokalemia Bipolar disorder, anxiety, OCD Hypertension Asthma  Hospital Course Summary:70 y.o. female with medical history significant for asthma, type 2 diabetes mellitus, hypertension, bipolar affective disorder, obsessive-compulsive disorder, and anxiety, now presenting to emergency department for evaluation of anemia.  Patient reports that she had right lower quadrant abdominal pain with nausea, nonbloody vomiting, and diarrhea approximately a week ago, had a syncopal episode upon standing on 07/30/2021, and was evaluated at an urgent care where she was found to be anemic.  She was started on antibiotics for suspected diverticulitis at the urgent care, completed the course on 08/07/2021.  All the symptoms have now resolved. She never had a colonoscopy, explaining that she was busy with a new grandchild when it was recommended.  She was seen in her primary care clinic with persistent anemia and directed to the ED for further evaluation and management.  Repeat labs on admission showed hemoglobin 6.3->5.9.  Also hypokalemic with potassium 2.9 on BMP.  She denied any melena or hematochezia.  Stool occult blood negative.  She denies any vaginal bleeding and is s/p hysterectomy.  She however does report episodes of epistaxis almost every month for couple of years and most of the times unprovoked.  Patient denies seeing ENT ever for this.  Patient was however diagnosed with iron deficiency in the past with baseline anemia, was prescribed iron pills which she stopped taking a year back due to constipation and intolerance.  As mentioned above she never had a  colonoscopy.  Patient received 2 units PRBC overnight and her repeat hemoglobin this morning is >9.  Given no active bleeding and she is asymptomatic ambulating in the room without any dyspnea, she is cleared for discharge and recommended follow-up with PCP with GI and ENT referral for further work-up.  Potassium was replaced and repeat level within normal limits.  Of note, during discharge medication reconciliation noted that patient on multiple psych medications-some of which she is taking in some which she is not.  Advised to follow-up PCP or primary psychiatrist for medical reconciliation and to rule out medication induced anemia/bone suppression.  Her blood pressure on arrival was stable at 128/70 but further recordings have indicated elevation as listed below.  She no longer is taking Lasix or losartan.  Will benefit from resuming losartan if blood pressure remains elevated upon returning home-advised PCP follow-up.    Discharge Exam:   Vitals:   08/08/21 0830 08/08/21 0900 08/08/21 0930 08/08/21 1000  BP: 128/70 (!) 142/71 (!) 142/73 (!) 133/122  Pulse: 70 74  74  Resp: 15 16 15 18   Temp:  98.1 F (36.7 C)    TempSrc:  Oral    SpO2: 97%   99%  Weight:      Height:        General: Pt is alert, awake, not in acute distress Cardiovascular: RRR, S1/S2 +, no rubs, no gallops Respiratory: CTA bilaterally, no wheezing, no rhonchi Abdominal: Soft, NT, ND, bowel sounds + Extremities: no edema, no cyanosis  Discharge Condition:Stable CODE STATUS: Diet recommendation: Recommendations for Outpatient Follow-up:  Follow up with PCP for Anemia work up , BP f/u and GI referral for colonoscopy Please follow up your primary care physician or  psychiatrist for medication reconciliation of multiple psych medications Follow up with consultants: GI/ ENT referral Please obtain follow up labs including: CBC in 1 week  Home Health services upon discharge: N/A Equipment/Devices upon discharge:  None   Discharge Instructions:  Discharge Instructions     Call MD for:   Complete by: As directed    Anemia work up , BP f/u and GI referral for colonoscopy Please follow up your primary care physician or psychiatrist for medication reconciliation of multiple psych medications Resume medications you were rtaking before admission, now new changes were made   Diet - low sodium heart healthy   Complete by: As directed    Increase activity slowly   Complete by: As directed       Allergies as of 08/08/2021       Reactions   Lyrica [pregabalin] Swelling   Other Other (See Comments)   Peanut - unknown   Peanut Oil Other (See Comments)   Tongue feel funny   Penicillins Other (See Comments)   Dental problems   Sulfa Antibiotics    Sulfamethoxazole-trimethoprim Hives   Lisinopril Cough        Medication List     STOP taking these medications    ciprofloxacin 500 MG tablet Commonly known as: CIPRO   diclofenac Sodium 1 % Gel Commonly known as: VOLTAREN   dicyclomine 10 MG capsule Commonly known as: BENTYL   furosemide 40 MG tablet Commonly known as: LASIX   Linzess 72 MCG capsule Generic drug: linaclotide   metroNIDAZOLE 500 MG tablet Commonly known as: FLAGYL   ondansetron 4 MG disintegrating tablet Commonly known as: Zofran ODT   prochlorperazine 5 MG tablet Commonly known as: COMPAZINE   topiramate 100 MG tablet Commonly known as: TOPAMAX   Xiidra 5 % Soln Generic drug: Lifitegrast       TAKE these medications    albuterol 108 (90 Base) MCG/ACT inhaler Commonly known as: VENTOLIN HFA INHALE 2 PUFFS BY MOUTH INTO THE LUNGS EVERY 4 HOURS AS NEEDED FOR WHEEZING OR SHORTNESS OF BREATH. What changed: See the new instructions.   atorvastatin 40 MG tablet Commonly known as: LIPITOR TAKE 1 TABLET BY MOUTH DAILY. What changed: how much to take   baclofen 10 MG tablet Commonly known as: LIORESAL Take 2 tablets (20 mg total) by mouth at bedtime.    clonazePAM 0.5 MG tablet Commonly known as: KLONOPIN Take 1 tablet (0.5 mg total) by mouth 2 (two) times daily. ( 2 of 3) What changed:  how much to take Another medication with the same name was removed. Continue taking this medication, and follow the directions you see here.   DULoxetine 60 MG capsule Commonly known as: CYMBALTA Take 2 capsules (120 mg total) by mouth daily. What changed:  how much to take Another medication with the same name was removed. Continue taking this medication, and follow the directions you see here.   Emgality 120 MG/ML Soaj Generic drug: Galcanezumab-gnlm INJECT 1 ML (120 MG TOTAL) INTO THE SKIN EVERY 30 DAYS. What changed:  how much to take how to take this when to take this   Eszopiclone 3 MG Tabs Take 1 tablet (3 mg total) by mouth every evening. What changed: Another medication with the same name was removed. Continue taking this medication, and follow the directions you see here.   ferrous sulfate 324 MG Tbec Take 1 tablet (324 mg total) by mouth every other day.   FLUoxetine 20 MG capsule Commonly known as:  PROZAC Take 1 capsule (20 mg total) by mouth daily. What changed: Another medication with the same name was removed. Continue taking this medication, and follow the directions you see here.   fluticasone 50 MCG/ACT nasal spray Commonly known as: FLONASE PLACE 2 SPRAYS INTO BOTH NOSTRILS DAILY. What changed:  when to take this reasons to take this   FREESTYLE LITE test strip Generic drug: glucose blood USE TO CHECK BLOOD SUGAR DAILY IN THE MORNING   gabapentin 400 MG capsule Commonly known as: NEURONTIN Take 2 capsules (800 mg total) by mouth 2 (two) times daily.   losartan 25 MG tablet Commonly known as: COZAAR TAKE 1 TABLET BY MOUTH DAILY.   metFORMIN 500 MG 24 hr tablet Commonly known as: GLUCOPHAGE-XR Take 2 tablets (1,000 mg total) by mouth in the morning and at bedtime.   montelukast 10 MG tablet Commonly known  as: SINGULAIR Take 1 tablet (10 mg total) by mouth at bedtime.   QUEtiapine 400 MG tablet Commonly known as: SEROQUEL Take 1 tablet (400 mg total) by mouth nightly.   rizatriptan 10 MG tablet Commonly known as: MAXALT Take 1 tablet (10 mg total) by mouth as needed for migraine. May repeat in 2 hours if needed What changed: when to take this        Allergies  Allergen Reactions   Lyrica [Pregabalin] Swelling   Other Other (See Comments)    Peanut - unknown   Peanut Oil Other (See Comments)    Tongue feel funny   Penicillins Other (See Comments)    Dental problems   Sulfa Antibiotics    Sulfamethoxazole-Trimethoprim Hives   Lisinopril Cough      The results of significant diagnostics from this hospitalization (including imaging, microbiology, ancillary and laboratory) are listed below for reference.    Labs: BNP (last 3 results) No results for input(s): BNP in the last 8760 hours. Basic Metabolic Panel: Recent Labs  Lab 08/07/21 1747 08/08/21 0437  NA 135 136  K 2.9* 3.7  CL 102 110  CO2 23 23  GLUCOSE 149* 141*  BUN 10 7*  CREATININE 0.73 0.60  CALCIUM 8.5* 8.3*  MG  --  1.9   Liver Function Tests: Recent Labs  Lab 08/07/21 1747  AST 23  ALT 21  ALKPHOS 111  BILITOT 0.3  PROT 6.9  ALBUMIN 3.5   No results for input(s): LIPASE, AMYLASE in the last 168 hours. No results for input(s): AMMONIA in the last 168 hours. CBC: Recent Labs  Lab 08/07/21 1617 08/07/21 1747 08/08/21 0943  WBC  --  6.8  --   NEUTROABS  --  4.8  --   HGB 6.3* 5.9* 9.3*  HCT  --  19.7* 29.1*  MCV  --  87.2  --   PLT  --  481*  --    Cardiac Enzymes: No results for input(s): CKTOTAL, CKMB, CKMBINDEX, TROPONINI in the last 168 hours. BNP: Invalid input(s): POCBNP CBG: Recent Labs  Lab 08/07/21 2320 08/08/21 0351 08/08/21 0807  GLUCAP 124* 135* 153*   D-Dimer No results for input(s): DDIMER in the last 72 hours. Hgb A1c Recent Labs    08/07/21 1617  HGBA1C  6.7   Lipid Profile No results for input(s): CHOL, HDL, LDLCALC, TRIG, CHOLHDL, LDLDIRECT in the last 72 hours. Thyroid function studies No results for input(s): TSH, T4TOTAL, T3FREE, THYROIDAB in the last 72 hours.  Invalid input(s): FREET3 Anemia work up Recent Labs    08/07/21 1747  VITAMINB12 865  FOLATE 29.2  FERRITIN 7*  TIBC 374  IRON 13*  RETICCTPCT 3.6*   Urinalysis    Component Value Date/Time   COLORURINE STRAW (A) 08/15/2020 1031   APPEARANCEUR CLEAR 08/15/2020 1031   LABSPEC 1.024 08/15/2020 1031   PHURINE 6.0 08/15/2020 1031   GLUCOSEU >=500 (A) 08/15/2020 1031   HGBUR NEGATIVE 08/15/2020 1031   BILIRUBINUR NEGATIVE 08/15/2020 1031   BILIRUBINUR negative 02/20/2020 1145   KETONESUR 5 (A) 08/15/2020 1031   PROTEINUR NEGATIVE 08/15/2020 1031   UROBILINOGEN 0.2 02/20/2020 1145   NITRITE NEGATIVE 08/15/2020 1031   LEUKOCYTESUR NEGATIVE 08/15/2020 1031   Sepsis Labs Invalid input(s): PROCALCITONIN,  WBC,  LACTICIDVEN Microbiology Recent Results (from the past 240 hour(s))  Resp Panel by RT-PCR (Flu A&B, Covid) Nasopharyngeal Swab     Status: None   Collection Time: 08/07/21  8:59 PM   Specimen: Nasopharyngeal Swab; Nasopharyngeal(NP) swabs in vial transport medium  Result Value Ref Range Status   SARS Coronavirus 2 by RT PCR NEGATIVE NEGATIVE Final    Comment: (NOTE) SARS-CoV-2 target nucleic acids are NOT DETECTED.  The SARS-CoV-2 RNA is generally detectable in upper respiratory specimens during the acute phase of infection. The lowest concentration of SARS-CoV-2 viral copies this assay can detect is 138 copies/mL. A negative result does not preclude SARS-Cov-2 infection and should not be used as the sole basis for treatment or other patient management decisions. A negative result may occur with  improper specimen collection/handling, submission of specimen other than nasopharyngeal swab, presence of viral mutation(s) within the areas targeted by this  assay, and inadequate number of viral copies(<138 copies/mL). A negative result must be combined with clinical observations, patient history, and epidemiological information. The expected result is Negative.  Fact Sheet for Patients:  BloggerCourse.com  Fact Sheet for Healthcare Providers:  SeriousBroker.it  This test is no t yet approved or cleared by the [place] FDA and  has been authorized for detection and/or diagnosis of SARS-CoV-2 by FDA under an Emergency Use Authorization (EUA). This EUA will remain  in effect (meaning this test can be used) for the duration of the COVID-19 declaration under Section 564(b)(1) of the Act, 21 U.S.C.section 360bbb-3(b)(1), unless the authorization is terminated  or revoked sooner.       Influenza A by PCR NEGATIVE NEGATIVE Final   Influenza B by PCR NEGATIVE NEGATIVE Final    Comment: (NOTE) The Xpert Xpress SARS-CoV-2/FLU/RSV plus assay is intended as an aid in the diagnosis of influenza from Nasopharyngeal swab specimens and should not be used as a sole basis for treatment. Nasal washings and aspirates are unacceptable for Xpert Xpress SARS-CoV-2/FLU/RSV testing.  Fact Sheet for Patients: BloggerCourse.com  Fact Sheet for Healthcare Providers: SeriousBroker.it  This test is not yet approved or cleared by the [place] FDA and has been authorized for detection and/or diagnosis of SARS-CoV-2 by FDA under an Emergency Use Authorization (EUA). This EUA will remain in effect (meaning this test can be used) for the duration of the COVID-19 declaration under Section 564(b)(1) of the Act, 21 U.S.C. section 360bbb-3(b)(1), unless the authorization is terminated or revoked.  Performed at [facility], [address], [place], [place] [address]     Procedures/Studies: DG Chest Port 1 View  Result Date:  08/07/2021 CLINICAL DATA:  Syncopal episode. EXAM: PORTABLE CHEST 1 VIEW COMPARISON:  None. FINDINGS: The heart size and mediastinal contours are within normal limits. There is moderate severity calcification of the aortic arch. Both lungs are clear.  A round radiopaque calcification is seen overlying the medial aspect of the left lung base. The visualized skeletal structures are unremarkable. IMPRESSION: No active cardiopulmonary disease. Electronically Signed   By: Aram Candela M.D.   On: 08/07/2021 21:18   DG Knee Complete 4 Views Left  Result Date: 07/31/2021 CLINICAL DATA:  70 year old female status post fall last night with pain and swelling. EXAM: LEFT KNEE - COMPLETE 4+ VIEW COMPARISON:  Left knee series 08/15/2020. FINDINGS: Bone mineralization is within normal limits. No evidence of fracture, dislocation, or joint effusion. Minimal for age knee joint degeneration. No acute osseous abnormality identified. No discrete soft tissue injury. Mild vascular calcifications. IMPRESSION: No acute osseous abnormality identified. Mild calcified peripheral vascular disease. Electronically Signed   By: Odessa Fleming M.D.   On: 07/31/2021 11:33    Time coordinating discharge: Over 30 minutes  SIGNED:   Alessandra Bevels, MD  [facility] 08/08/2021, 12:01 PM

## 2021-08-11 ENCOUNTER — Ambulatory Visit: Payer: Medicare HMO | Admitting: Family Medicine

## 2021-08-11 LAB — TYPE AND SCREEN
ABO/RH(D): A POS
Antibody Screen: NEGATIVE
Unit division: 0
Unit division: 0

## 2021-08-11 LAB — BPAM RBC
Blood Product Expiration Date: 202210202359
Blood Product Expiration Date: 202210202359
ISSUE DATE / TIME: 202210062308
ISSUE DATE / TIME: 202210070355
Unit Type and Rh: 6200
Unit Type and Rh: 6200

## 2021-08-13 ENCOUNTER — Other Ambulatory Visit: Payer: Self-pay | Admitting: Student

## 2021-08-13 ENCOUNTER — Other Ambulatory Visit: Payer: Self-pay | Admitting: Family Medicine

## 2021-08-13 ENCOUNTER — Other Ambulatory Visit (HOSPITAL_COMMUNITY): Payer: Self-pay

## 2021-08-13 MED ORDER — GABAPENTIN 400 MG PO CAPS
800.0000 mg | ORAL_CAPSULE | Freq: Two times a day (BID) | ORAL | 0 refills | Status: DC
  Filled 2021-08-13: qty 30, 8d supply, fill #0

## 2021-08-14 ENCOUNTER — Other Ambulatory Visit (HOSPITAL_COMMUNITY): Payer: Self-pay

## 2021-08-14 MED ORDER — TOPIRAMATE 100 MG PO TABS
100.0000 mg | ORAL_TABLET | Freq: Two times a day (BID) | ORAL | 0 refills | Status: DC
  Filled 2021-08-14: qty 60, 30d supply, fill #0

## 2021-08-14 MED ORDER — TIZANIDINE HCL 6 MG PO CAPS
6.0000 mg | ORAL_CAPSULE | Freq: Two times a day (BID) | ORAL | 0 refills | Status: DC
  Filled 2021-08-14: qty 60, 30d supply, fill #0

## 2021-08-14 MED FILL — Fluticasone Propionate Nasal Susp 50 MCG/ACT: NASAL | 30 days supply | Qty: 16 | Fill #2 | Status: AC

## 2021-08-15 ENCOUNTER — Other Ambulatory Visit (HOSPITAL_COMMUNITY): Payer: Self-pay

## 2021-08-15 MED ORDER — DULOXETINE HCL 60 MG PO CPEP
120.0000 mg | ORAL_CAPSULE | Freq: Every day | ORAL | 0 refills | Status: DC
  Filled 2021-08-15: qty 90, 45d supply, fill #0

## 2021-08-20 ENCOUNTER — Ambulatory Visit: Payer: Medicare HMO | Admitting: Student

## 2021-08-22 ENCOUNTER — Other Ambulatory Visit (HOSPITAL_COMMUNITY): Payer: Self-pay

## 2021-08-26 ENCOUNTER — Other Ambulatory Visit: Payer: Self-pay | Admitting: Student

## 2021-08-26 ENCOUNTER — Other Ambulatory Visit (HOSPITAL_COMMUNITY): Payer: Self-pay

## 2021-08-27 ENCOUNTER — Encounter: Payer: Self-pay | Admitting: Internal Medicine

## 2021-08-27 ENCOUNTER — Other Ambulatory Visit (HOSPITAL_COMMUNITY): Payer: Self-pay

## 2021-09-01 ENCOUNTER — Other Ambulatory Visit (HOSPITAL_COMMUNITY): Payer: Self-pay

## 2021-09-01 ENCOUNTER — Ambulatory Visit: Payer: Medicare HMO | Admitting: Student

## 2021-09-02 ENCOUNTER — Other Ambulatory Visit (HOSPITAL_COMMUNITY): Payer: Self-pay

## 2021-09-03 ENCOUNTER — Other Ambulatory Visit (HOSPITAL_COMMUNITY): Payer: Self-pay

## 2021-09-04 ENCOUNTER — Ambulatory Visit (INDEPENDENT_AMBULATORY_CARE_PROVIDER_SITE_OTHER): Payer: 59 | Admitting: Family Medicine

## 2021-09-04 ENCOUNTER — Other Ambulatory Visit: Payer: Self-pay

## 2021-09-04 ENCOUNTER — Ambulatory Visit (INDEPENDENT_AMBULATORY_CARE_PROVIDER_SITE_OTHER): Payer: 59

## 2021-09-04 ENCOUNTER — Encounter: Payer: Self-pay | Admitting: Family Medicine

## 2021-09-04 ENCOUNTER — Other Ambulatory Visit (HOSPITAL_COMMUNITY): Payer: Self-pay

## 2021-09-04 VITALS — BP 141/66 | HR 93 | Ht 63.0 in | Wt 157.6 lb

## 2021-09-04 DIAGNOSIS — Z23 Encounter for immunization: Secondary | ICD-10-CM

## 2021-09-04 DIAGNOSIS — K5904 Chronic idiopathic constipation: Secondary | ICD-10-CM

## 2021-09-04 DIAGNOSIS — D500 Iron deficiency anemia secondary to blood loss (chronic): Secondary | ICD-10-CM | POA: Diagnosis not present

## 2021-09-04 DIAGNOSIS — D509 Iron deficiency anemia, unspecified: Secondary | ICD-10-CM

## 2021-09-04 DIAGNOSIS — M797 Fibromyalgia: Secondary | ICD-10-CM

## 2021-09-04 DIAGNOSIS — I10 Essential (primary) hypertension: Secondary | ICD-10-CM

## 2021-09-04 LAB — POCT HEMOGLOBIN: Hemoglobin: 9.7 g/dL — AB (ref 11–14.6)

## 2021-09-04 MED ORDER — CLONAZEPAM 0.5 MG PO TABS
0.5000 mg | ORAL_TABLET | Freq: Two times a day (BID) | ORAL | 0 refills | Status: DC
  Filled 2021-09-04: qty 60, 30d supply, fill #0

## 2021-09-04 MED ORDER — FERROUS SULFATE 324 MG PO TBEC
324.0000 mg | DELAYED_RELEASE_TABLET | ORAL | 0 refills | Status: AC
  Filled 2021-09-04: qty 90, 180d supply, fill #0

## 2021-09-04 MED ORDER — POLYETHYLENE GLYCOL 3350 17 GM/SCOOP PO POWD
17.0000 g | Freq: Two times a day (BID) | ORAL | 1 refills | Status: DC | PRN
  Filled 2021-09-04: qty 3350, 99d supply, fill #0

## 2021-09-04 MED ORDER — BACLOFEN 10 MG PO TABS
20.0000 mg | ORAL_TABLET | Freq: Every day | ORAL | 1 refills | Status: DC
  Filled 2021-09-04: qty 120, 60d supply, fill #0

## 2021-09-04 MED ORDER — GABAPENTIN 400 MG PO CAPS
800.0000 mg | ORAL_CAPSULE | Freq: Two times a day (BID) | ORAL | 0 refills | Status: DC
  Filled 2021-09-04: qty 120, 30d supply, fill #0

## 2021-09-04 NOTE — Patient Instructions (Addendum)
It was wonderful to see you today.  Please bring ALL of your medications with you to every visit.   Today we talked about:   --Please carefully review your medications with your other prescribers--many have side effects  --I placed a referral to the blood doctor (Hematology)--I strongly encourage you to undergo evaluation with a Gastroenterologist  -- Your hemoglobin number was 9.7---this is great  --I will call you with the rest of your blood work    -Start iron every other day     Thank you for choosing [facility].   Please call [phone] with any questions about today's appointment.  Please be sure to schedule follow up at the front  desk before you leave today.   Terisa Starr, MD  Family Medicine

## 2021-09-04 NOTE — Assessment & Plan Note (Signed)
Ferritin of 7 confirming iron deficiency.  She has no vaginal bleeding and no other bleeding.  She has a history of what sounds like possible gluten allergy consistent with celiac disease.  She does avoid gluten but admits she does not avoid entirely.  As such we will test for celiac.  Point-of-care hemoglobin appropriate today.  Recommended restarting iron therapy.  Given this causes constipation also sent MiraLAX.  Reviewed strict return precautions.  She also has follow-up with gastroenterology.  She reports that she was told she needs to see a hematologist.  I discussed that first and most important is that she undergo a colonoscopy and possible upper endoscopy for her severe iron deficiency anemia.  If they then requested hematology consult we will do so.

## 2021-09-04 NOTE — Assessment & Plan Note (Signed)
Not at goal, BMP today. Will restart losartan pending results.

## 2021-09-04 NOTE — Assessment & Plan Note (Signed)
Refilled baclofen and gabapentin.  Spent 5 minutes counseling patient and her husband today about the risks of continued use of multiple muscle relaxants and sedating medications particular as she ages.  I am concerned about worsening memory status and falls.  We discussed at length.

## 2021-09-04 NOTE — Progress Notes (Signed)
    SUBJECTIVE:   CHIEF COMPLAINT / HPI:  Kelly Shannon is a very pleasant 70 year old woman with history significant for recent admission for fibromyalgia, bipolar disorder, HTN, and recent admission for  iron deficiency anemia.   Anemia: patient admitted to [facility] for symptomatic anemia to 5.9 on 10/6. She received 2U pRBCs and hgb was stable. No source of bleeding found during that admission. She has GI appt scheduled for 09/23/21. She denies melena and hematochezia. She had normal MCV, normal folate, and normal Vit B12. Will obtain peripheral smear. Ferritin low at 7.   Since discharge she reports her abdominal pain is improved.  She is complete her course of antibiotics.  She is chronic intermittent abdominal pain if she eats gluten but she tries to avoid this at all costs.  She does think there is probably still some gluten in her diet.  She is never been checked for celiac disease.  She is scheduled for her gastroenterology appointment.  Prior to this episode she did have an episode of syncope at home.  She denies any recurrent syncope, chest pain, difficulty breathing, melena, hematochezia, diarrhea, constipation.  She has green-colored stools each day.  She is not taking her iron tablets as she was told to stop these  Hypokalemia: found to have significant hypokalemia to 2.9 on admission after a few days of diarrheal illness. Repleted and improved at time of discharge. Will recheck BMP today.   TN: BP today is not at goal. Losartan held during hospital admission last month due to hypotension.  HC maintenance: mammogram, DEXA scan  PERTINENT  PMH / PSH: Reviewed and noted   OBJECTIVE:   BP (!) 141/66   Pulse 93   Ht 5\' 3"  (1.6 m)   Wt 157 lb 9.6 oz (71.5 kg)   SpO2 96%   BMI 27.92 kg/m   Well-appearing woman in no distress.  She has a mild tremor on exam.  Regular rate and rhythm no murmurs rubs or gallops.  Lungs clear bilaterally to auscultation. Abdomen is soft nontender  nondistended. ASSESSMENT/PLAN:   Hypertension Not at goal, BMP today. Will restart losartan pending results.    Fibromyalgia Refilled baclofen and gabapentin.  Spent 5 minutes counseling patient and her husband today about the risks of continued use of multiple muscle relaxants and sedating medications particular as she ages.  I am concerned about worsening memory status and falls.  We discussed at length.  Iron deficiency anemia Ferritin of 7 confirming iron deficiency.  She has no vaginal bleeding and no other bleeding.  She has a history of what sounds like possible gluten allergy consistent with celiac disease.  She does avoid gluten but admits she does not avoid entirely.  As such we will test for celiac.  Point-of-care hemoglobin appropriate today.  Recommended restarting iron therapy.  Given this causes constipation also sent MiraLAX.  Reviewed strict return precautions.  She also has follow-up with gastroenterology.  She reports that she was told she needs to see a hematologist.  I discussed that first and most important is that she undergo a colonoscopy and possible upper endoscopy for her severe iron deficiency anemia.  If they then requested hematology consult we will do so.    HCM Covid and PCV20 today.   , MD [facility]

## 2021-09-08 ENCOUNTER — Telehealth: Payer: Self-pay

## 2021-09-08 ENCOUNTER — Other Ambulatory Visit (HOSPITAL_COMMUNITY): Payer: Self-pay

## 2021-09-08 DIAGNOSIS — K5900 Constipation, unspecified: Secondary | ICD-10-CM

## 2021-09-08 DIAGNOSIS — K5904 Chronic idiopathic constipation: Secondary | ICD-10-CM

## 2021-09-08 LAB — CBC WITH DIFFERENTIAL/PLATELET
Basophils Absolute: 0 10*3/uL (ref 0.0–0.2)
Basos: 0 %
EOS (ABSOLUTE): 0.1 10*3/uL (ref 0.0–0.4)
Eos: 2 %
Hematocrit: 32.3 % — ABNORMAL LOW (ref 34.0–46.6)
Hemoglobin: 10 g/dL — ABNORMAL LOW (ref 11.1–15.9)
Immature Grans (Abs): 0 10*3/uL (ref 0.0–0.1)
Immature Granulocytes: 0 %
Lymphocytes Absolute: 1.2 10*3/uL (ref 0.7–3.1)
Lymphs: 22 %
MCH: 24.6 pg — ABNORMAL LOW (ref 26.6–33.0)
MCHC: 31 g/dL — ABNORMAL LOW (ref 31.5–35.7)
MCV: 80 fL (ref 79–97)
Monocytes Absolute: 0.3 10*3/uL (ref 0.1–0.9)
Monocytes: 5 %
Neutrophils Absolute: 4.1 10*3/uL (ref 1.4–7.0)
Neutrophils: 71 %
Platelets: 358 10*3/uL (ref 150–450)
RBC: 4.06 x10E6/uL (ref 3.77–5.28)
RDW: 16.8 % — ABNORMAL HIGH (ref 11.7–15.4)
WBC: 5.7 10*3/uL (ref 3.4–10.8)

## 2021-09-08 LAB — COMPREHENSIVE METABOLIC PANEL
ALT: 11 IU/L (ref 0–32)
AST: 14 IU/L (ref 0–40)
Albumin/Globulin Ratio: 2 (ref 1.2–2.2)
Albumin: 4.4 g/dL (ref 3.8–4.8)
Alkaline Phosphatase: 136 IU/L — ABNORMAL HIGH (ref 44–121)
BUN/Creatinine Ratio: 18 (ref 12–28)
BUN: 13 mg/dL (ref 8–27)
Bilirubin Total: <0.2 mg/dL (ref 0.0–1.2)
CO2: 22 mmol/L (ref 20–29)
Calcium: 9.6 mg/dL (ref 8.7–10.3)
Chloride: 104 mmol/L (ref 96–106)
Creatinine, Ser: 0.74 mg/dL (ref 0.57–1.00)
Globulin, Total: 2.2 g/dL (ref 1.5–4.5)
Glucose: 226 mg/dL — ABNORMAL HIGH (ref 70–99)
Potassium: 4.1 mmol/L (ref 3.5–5.2)
Sodium: 141 mmol/L (ref 134–144)
Total Protein: 6.6 g/dL (ref 6.0–8.5)
eGFR: 88 mL/min/{1.73_m2} (ref >59)

## 2021-09-08 LAB — CELIAC DISEASE AB SCREEN W/RFX
Antigliadin Abs, IgA: 4 units (ref 0–19)
IgA/Immunoglobulin A, Serum: 321 mg/dL (ref 87–352)
Transglutaminase IgA: <2 U/mL (ref 0–3)

## 2021-09-08 MED ORDER — POLYETHYLENE GLYCOL 3350 17 GM/SCOOP PO POWD
17.0000 g | Freq: Two times a day (BID) | ORAL | 1 refills | Status: DC | PRN
  Filled 2021-09-08: qty 3350, 99d supply, fill #0
  Filled 2021-09-09: qty 510, 15d supply, fill #0

## 2021-09-08 MED ORDER — LINACLOTIDE 72 MCG PO CAPS
72.0000 ug | ORAL_CAPSULE | Freq: Every day | ORAL | 6 refills | Status: AC
  Filled 2021-09-08: qty 90, 90d supply, fill #0
  Filled 2022-01-13: qty 90, 90d supply, fill #1

## 2021-09-08 NOTE — Telephone Encounter (Signed)
Called patient re: lab results from visit on 11/3. Hgb stable, still anemic. Will need to continue taking iron supplementation, she picked up rx from Dr. Manson Passey on 11/3. She reports painful constipation though. Previously she did well with miralax and linzess to have 1 soft BM per day, will resend these medications.   Hypokalemia is resolved, s cr at baseline. Patient can restart her losartan, she says she has enough of this medication and does not need a refill at this time.  Follow up in 1 month after the visit to the GI doctor on 09/23/21.  Shirlean Mylar, MD [facility], PGY-3

## 2021-09-08 NOTE — Telephone Encounter (Signed)
Patient calls nurse line requesting returned phone call from provider to discuss results from recent visit.   Please advise.   Veronda Prude, RN

## 2021-09-08 NOTE — Addendum Note (Signed)
Addended by: Horton Chin on: 09/08/2021 01:32 PM   Modules accepted: Orders

## 2021-09-09 ENCOUNTER — Other Ambulatory Visit (HOSPITAL_COMMUNITY): Payer: Self-pay

## 2021-09-17 DIAGNOSIS — Z0100 Encounter for examination of eyes and vision without abnormal findings: Secondary | ICD-10-CM | POA: Diagnosis not present

## 2021-09-18 ENCOUNTER — Ambulatory Visit (INDEPENDENT_AMBULATORY_CARE_PROVIDER_SITE_OTHER): Payer: 59

## 2021-09-18 ENCOUNTER — Other Ambulatory Visit: Payer: Self-pay

## 2021-09-18 ENCOUNTER — Ambulatory Visit (INDEPENDENT_AMBULATORY_CARE_PROVIDER_SITE_OTHER): Payer: 59 | Admitting: Orthopaedic Surgery

## 2021-09-18 ENCOUNTER — Encounter: Payer: Self-pay | Admitting: Orthopaedic Surgery

## 2021-09-18 ENCOUNTER — Other Ambulatory Visit (HOSPITAL_COMMUNITY): Payer: Self-pay

## 2021-09-18 ENCOUNTER — Other Ambulatory Visit: Payer: Self-pay | Admitting: Student

## 2021-09-18 DIAGNOSIS — G8929 Other chronic pain: Secondary | ICD-10-CM | POA: Diagnosis not present

## 2021-09-18 DIAGNOSIS — M545 Low back pain, unspecified: Secondary | ICD-10-CM | POA: Diagnosis not present

## 2021-09-18 DIAGNOSIS — M25562 Pain in left knee: Secondary | ICD-10-CM

## 2021-09-18 DIAGNOSIS — M25552 Pain in left hip: Secondary | ICD-10-CM

## 2021-09-18 NOTE — Progress Notes (Signed)
Office Visit Note   Patient: Kelly Shannon           Date of Birth: 09-Jun-1951           MRN: [id number] Visit Date: 09/18/2021              Requested by: Levin Erp, MD [address] [place],  [place] [address] PCP: Levin Erp, MD   Assessment & Plan: Visit Diagnoses: No diagnosis found.  Plan: Patient with a history of left knee arthroscopy by Dr. Cleophas Dunker years ago.  She was doing quite well until she had a fall after passing out.  This was secondary to a hemoglobin of 5.  She did have a transfusion and is being worked up for the reason and her hemoglobin is stable at 10.  She came in today with a 6-week history of left posterior buttock pain that radiates down into her leg and left knee pain.  Her x-rays overall are reassuring.  Her hip x-rays were normal her knee x-rays did not show any osseous injuries and just a little arthritis.  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  Because of her anemia she should be careful in taking anti-inflammatories.  We have recommended a course of physical therapy and have given her a prescription to do this.  She will follow-up in 6 weeks if she still having symptoms.  If her back is still significantly painful we would recommend an MRI  Follow-Up Instructions: No follow-ups on file.   Orders:  No orders of the defined types were placed in this encounter.  No orders of the defined types were placed in this encounter.     Procedures: No procedures performed   Clinical Data: No additional findings.   Subjective: Chief Complaint  Patient presents with   Left Hip - Pain   Left Knee - Pain  Patient presents today for left hip and left knee pain. She states that she fell 08/06/2021 and has had pain since. She has pain in her left buttock and it radiates down to her left knee. No groin pain, lower back pain, or numbness and tingling.  No loss of bowel or bladder control no giving way of her  lower extremities she also has been having pain in her left knee medially and laterally. She said that it swells and throbs all the time. She takes Aleve as needed.    Review of Systems  All other systems reviewed and are negative.   Objective: Vital Signs: There were no vitals taken for this visit.  Physical Exam Constitutional:      Appearance: Normal appearance.  Pulmonary:     Effort: Pulmonary effort is normal.  Skin:    General: Skin is warm and dry.  Neurological:     General: No focal deficit present.     Mental Status: She is alert and oriented to person, place, and time.    Ortho Exam Examination of her left knee she has mild soft tissue swelling and probable very minimal effusion.  No cellulitis no redness.  No warmth.  She has good endpoint on varus and valgus stressing as well as anterior draw.  She is able to extend her leg and hold her leg up without difficulty.  She does have some tenderness over the medial and lateral joint line of the knee. Examination of her hip demonstrates good mobility no pain in the groin.  Examination  of her lumbar spine no palpable deformity.  She has good strength with dorsiflexion plantarflexion resisted straightening and flexing of her legs.  When a straight leg is performed on the left it reproduces some straining on the lateral side of her leg. Specialty Comments:  No specialty comments available.  Imaging: No results found.   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   Fibromyalgia 12/19/2015   HLD  (hyperlipidemia) 12/19/2015   Hypertension 12/19/2015   Cannot sleep 12/19/2015   Lumbar radiculopathy 12/19/2015   Polypharmacy 11/29/2015   Antritis chronic 09/18/2015   Anemia 01/18/2015   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   Cardiac murmur 03/08/2012   Chronic neck pain 02/10/2012   Chronic elbow pain 02/10/2012   Bilateral chronic knee pain 02/10/2012   Chronic hand pain 02/10/2012   Chronic ankle pain 02/10/2012   Chronic pain 02/10/2012   Bipolar affective disorder (HCC) 09/22/2011   Hot flash, menopausal 09/22/2011   Menopausal and perimenopausal disorder 09/15/2011   Bipolar affective disorder, depressed, severe, with psychotic behavior (HCC) 08/11/2011   Type 2 diabetes mellitus (HCC) 08/11/2011   Constipation 02/16/2010   Disturbance in sleep behavior 06/16/2009   Past Medical History:  Diagnosis Date   Asthma    Diabetes mellitus    Hyperlipidemia    Hypertension     Family History  Problem Relation Age of Onset   Diabetes Mother    Heart disease Mother    Heart disease Father     Past Surgical History:  Procedure Laterality Date   ABDOMINAL HYSTERECTOMY     Social History   Occupational History   Not on file  Tobacco Use   Smoking status: Never   Smokeless tobacco: Never  Substance and Sexual Activity   Alcohol use: No   Drug use: No   Sexual activity: Yes

## 2021-09-19 ENCOUNTER — Other Ambulatory Visit: Payer: Self-pay | Admitting: Student

## 2021-09-19 ENCOUNTER — Other Ambulatory Visit (HOSPITAL_COMMUNITY): Payer: Self-pay

## 2021-09-19 ENCOUNTER — Other Ambulatory Visit: Payer: Self-pay

## 2021-09-19 NOTE — Telephone Encounter (Signed)
Opened in error.   Edwen Mclester C Florence Yeung, RN  

## 2021-09-23 ENCOUNTER — Ambulatory Visit: Payer: 59 | Admitting: Internal Medicine

## 2021-09-23 ENCOUNTER — Other Ambulatory Visit (HOSPITAL_COMMUNITY): Payer: Self-pay

## 2021-09-23 ENCOUNTER — Other Ambulatory Visit: Payer: Self-pay | Admitting: Student

## 2021-09-23 NOTE — Telephone Encounter (Signed)
Patient calls nurse line checking the status of medication refills.   Please advise.

## 2021-09-24 ENCOUNTER — Other Ambulatory Visit (HOSPITAL_COMMUNITY): Payer: Self-pay

## 2021-09-24 ENCOUNTER — Other Ambulatory Visit: Payer: Self-pay | Admitting: Student

## 2021-09-24 MED ORDER — TIZANIDINE HCL 6 MG PO CAPS
6.0000 mg | ORAL_CAPSULE | Freq: Two times a day (BID) | ORAL | 0 refills | Status: DC
  Filled 2021-09-24: qty 60, 30d supply, fill #0

## 2021-09-24 MED ORDER — TOPIRAMATE 100 MG PO TABS
100.0000 mg | ORAL_TABLET | Freq: Two times a day (BID) | ORAL | 0 refills | Status: DC
  Filled 2021-09-24: qty 60, 30d supply, fill #0

## 2021-09-24 MED ORDER — TIZANIDINE HCL 6 MG PO CAPS
ORAL_CAPSULE | ORAL | 0 refills | Status: DC
  Filled 2021-09-24: qty 60, fill #0

## 2021-09-24 NOTE — Telephone Encounter (Signed)
Patient returns call to nurse line. Patient is requesting refill as soon as possible, due to holiday.   Please advise.

## 2021-09-29 ENCOUNTER — Other Ambulatory Visit (HOSPITAL_COMMUNITY): Payer: Self-pay

## 2021-09-30 ENCOUNTER — Other Ambulatory Visit (HOSPITAL_COMMUNITY): Payer: Self-pay

## 2021-10-06 ENCOUNTER — Other Ambulatory Visit (HOSPITAL_COMMUNITY): Payer: Self-pay

## 2021-10-06 ENCOUNTER — Other Ambulatory Visit: Payer: Self-pay | Admitting: Family Medicine

## 2021-10-06 DIAGNOSIS — M797 Fibromyalgia: Secondary | ICD-10-CM

## 2021-10-06 MED ORDER — CLONAZEPAM 0.5 MG PO TABS
0.5000 mg | ORAL_TABLET | Freq: Two times a day (BID) | ORAL | 0 refills | Status: AC
  Filled 2021-10-06: qty 60, 30d supply, fill #0

## 2021-10-07 ENCOUNTER — Other Ambulatory Visit (HOSPITAL_COMMUNITY): Payer: Self-pay

## 2021-10-15 ENCOUNTER — Encounter: Payer: Self-pay | Admitting: Family Medicine

## 2021-10-15 ENCOUNTER — Ambulatory Visit: Payer: 59 | Admitting: Internal Medicine

## 2021-10-16 ENCOUNTER — Other Ambulatory Visit (HOSPITAL_COMMUNITY): Payer: Self-pay

## 2021-10-16 DIAGNOSIS — G4700 Insomnia, unspecified: Secondary | ICD-10-CM | POA: Diagnosis not present

## 2021-10-16 DIAGNOSIS — F319 Bipolar disorder, unspecified: Secondary | ICD-10-CM | POA: Diagnosis not present

## 2021-10-16 DIAGNOSIS — F429 Obsessive-compulsive disorder, unspecified: Secondary | ICD-10-CM | POA: Diagnosis not present

## 2021-10-16 DIAGNOSIS — F411 Generalized anxiety disorder: Secondary | ICD-10-CM | POA: Diagnosis not present

## 2021-10-16 MED ORDER — ESZOPICLONE 3 MG PO TABS
3.0000 mg | ORAL_TABLET | Freq: Every day | ORAL | 2 refills | Status: AC
  Filled 2021-10-16: qty 30, 30d supply, fill #0
  Filled 2021-11-19: qty 30, 30d supply, fill #1
  Filled 2021-12-22: qty 30, 30d supply, fill #2

## 2021-10-16 MED ORDER — CLONAZEPAM 0.5 MG PO TABS
0.5000 mg | ORAL_TABLET | Freq: Two times a day (BID) | ORAL | 0 refills | Status: AC
  Filled 2021-12-08: qty 60, 30d supply, fill #0

## 2021-10-16 MED ORDER — CLONAZEPAM 0.5 MG PO TABS
0.5000 mg | ORAL_TABLET | Freq: Two times a day (BID) | ORAL | 0 refills | Status: DC
  Filled 2022-01-08: qty 60, 30d supply, fill #0

## 2021-10-16 MED ORDER — QUETIAPINE FUMARATE 400 MG PO TABS
400.0000 mg | ORAL_TABLET | Freq: Every evening | ORAL | 0 refills | Status: AC
  Filled 2021-10-16 – 2021-12-08 (×2): qty 90, 90d supply, fill #0

## 2021-10-16 MED ORDER — FLUOXETINE HCL 20 MG PO CAPS
20.0000 mg | ORAL_CAPSULE | Freq: Every day | ORAL | 0 refills | Status: AC
  Filled 2021-10-16: qty 90, 90d supply, fill #0

## 2021-10-16 MED ORDER — CLONAZEPAM 0.5 MG PO TABS
0.5000 mg | ORAL_TABLET | Freq: Two times a day (BID) | ORAL | 0 refills | Status: AC
  Filled 2021-11-07: qty 60, 30d supply, fill #0

## 2021-10-16 MED ORDER — DULOXETINE HCL 60 MG PO CPEP
120.0000 mg | ORAL_CAPSULE | Freq: Every day | ORAL | 0 refills | Status: AC
  Filled 2021-10-16: qty 180, 90d supply, fill #0

## 2021-10-21 ENCOUNTER — Other Ambulatory Visit (HOSPITAL_COMMUNITY): Payer: Self-pay

## 2021-10-21 ENCOUNTER — Other Ambulatory Visit: Payer: Self-pay

## 2021-10-21 ENCOUNTER — Encounter: Payer: Self-pay | Admitting: Student

## 2021-10-21 ENCOUNTER — Ambulatory Visit (INDEPENDENT_AMBULATORY_CARE_PROVIDER_SITE_OTHER): Payer: 59 | Admitting: Student

## 2021-10-21 VITALS — BP 175/81 | HR 84 | Wt 162.5 lb

## 2021-10-21 DIAGNOSIS — G43009 Migraine without aura, not intractable, without status migrainosus: Secondary | ICD-10-CM

## 2021-10-21 DIAGNOSIS — I10 Essential (primary) hypertension: Secondary | ICD-10-CM

## 2021-10-21 DIAGNOSIS — D649 Anemia, unspecified: Secondary | ICD-10-CM | POA: Diagnosis not present

## 2021-10-21 DIAGNOSIS — M797 Fibromyalgia: Secondary | ICD-10-CM

## 2021-10-21 DIAGNOSIS — K5904 Chronic idiopathic constipation: Secondary | ICD-10-CM | POA: Diagnosis not present

## 2021-10-21 MED ORDER — BACLOFEN 10 MG PO TABS
20.0000 mg | ORAL_TABLET | Freq: Every day | ORAL | 0 refills | Status: AC
  Filled 2021-10-21 – 2021-10-22 (×2): qty 90, 45d supply, fill #0

## 2021-10-21 MED ORDER — POLYETHYLENE GLYCOL 3350 17 GM/SCOOP PO POWD
17.0000 g | Freq: Two times a day (BID) | ORAL | 1 refills | Status: AC | PRN
  Filled 2021-10-21: qty 1020, 30d supply, fill #0

## 2021-10-21 MED ORDER — TOPIRAMATE 100 MG PO TABS
100.0000 mg | ORAL_TABLET | Freq: Two times a day (BID) | ORAL | 0 refills | Status: AC
  Filled 2021-10-21: qty 60, 30d supply, fill #0

## 2021-10-21 MED ORDER — GABAPENTIN 300 MG PO CAPS
600.0000 mg | ORAL_CAPSULE | Freq: Two times a day (BID) | ORAL | 3 refills | Status: DC
  Filled 2021-10-21: qty 90, 23d supply, fill #0
  Filled 2022-05-25: qty 90, 23d supply, fill #1

## 2021-10-21 NOTE — Assessment & Plan Note (Signed)
175/81 today in clinic. Denied any symptoms of end organ damage. Said she took her losartan this morning but has been running around most of the day. Says her BP has normal run in 120s/70s-80s. -Advised keeping record of BP at home and potential for increasing losartan at next visit

## 2021-10-21 NOTE — Patient Instructions (Signed)
It was great to see you! Thank you for allowing me to participate in your care!   I recommend that you always bring your medications to each appointment as this makes it easy to ensure we are on the correct medications and helps [place] not miss when refills are needed.  Our plans for today:  - I have reduced your gabapentin to 300 mg 2 tablets in the morning and 2 tablets in the evening - I have not refilled your tizanidine since this is not a safe drug for those 65 and older - Please continue taking your iron tablet daily, we can check your hemoglobin here or at the GI appointment whichever one you prefer - Please start taking your blood pressure at home and documenting this, we may need to increase your losartan in the future - We will follow up in 2 months after your GI appointment  Take care and seek immediate care sooner if you develop any concerns. Please remember to show up 15 minutes before your scheduled appointment time!  Levin Erp, MD [facility]

## 2021-10-21 NOTE — Assessment & Plan Note (Addendum)
Discussed in depth risks of falls and sedation with the combination of medications she is currently on. I spoke with Dr. Leveda Anna about the management of her medications as well. As patient is over 65, many of the medications and dosages can cause sedation, memory impairment and increased risk of falls. Today, I discussed with patient that I am not refilling her tizanidine 6 mg BID prescription and have decreased her gabapentin from 1600 mg daily total to 1200 mg daily total. Patient expressed her dissatisfaction in plan, I empathized with her on her pain but I reiterated need for safe medical prescribing for her own safety. I also let patient know that in future visits we will be working to taper her medications to have a safe regimen going forward for her.  -Refilled baclofen 20 mg daily -Gabapentin changed from 800 mg BID to 600 mg BID -Tizanidine 6 mg BID d/c'd -Psychiatry to potentially readdress medications/dosages (klonopin, duloxetine, fluoxetine, seroquel, lunesta) -Continue follow ups for refills of these medications

## 2021-10-21 NOTE — Progress Notes (Signed)
° ° °  SUBJECTIVE:   CHIEF COMPLAINT / HPI: Medication follow up  Medication Follow Up Patient's medication list includes baclofen 20 mg nightly, gabapentin 400mg  2 tablets BID, tizanidine 6 mg BID. Psychiatry prescribing klonopin (2 tablets daily), duloxetine, fluoxetine, seroquel, and lunesta. Says she has not had any falls recently except during hospitalization in October for symptomatic anemia. She says she has been taking these medications for around 20 years for fibromyalgia and psychiatric needs.  Symptomatic Anemia Has been taking her iron tablet daily and not been having constipation currently. Had hospitalization 10/7 for symptomatic anemia. GI appointment was scheduled for 11/22 but she rescheduled it for after the first of the year. She denies any lightheadedness since then. She denies any hematochezia, gum bleeding, vaginal bleeding.   HTN Migraines as normal, denies chest pain, shortness of breath, or vision changes. Usually does not take it at home but says it is usually around 120s/70s-80s. Says she did take her 25 mg losartan today.    PERTINENT  PMH / PSH: Bipolar affective disorder, fibromyalgia, anxiety  OBJECTIVE:   BP (!) 175/81    Pulse 84    Wt 162 lb 8 oz (73.7 kg)    SpO2 98%    BMI 28.79 kg/m   General: Nontoxic, awake, alert, responsive to questions Head: Normocephalic atraumatic CV: Regular rate and rhythm no murmurs rubs or gallops Respiratory: Clear to ausculation bilaterally, no wheezes rales or crackles, chest rises symmetrically,  no increased work of breathing Abdomen: Soft, non-tender, non-distended Extremities: Moves upper and lower extremities freely, no edema in LE, gait normal  ASSESSMENT/PLAN:   Fibromyalgia Discussed in depth risks of falls and sedation with the combination of medications she is currently on. I spoke with Dr. 12/22 about the management of her medications as well. As patient is over 65, many of the medications and dosages can  cause sedation, memory impairment and increased risk of falls. Today, I discussed with patient that I am not refilling her tizanidine 6 mg BID prescription and have decreased her gabapentin from 1600 mg daily total to 1200 mg daily total. Patient expressed her dissatisfaction in plan, I empathized with her on her pain but I reiterated need for safe medical prescribing for her own safety. I also let patient know that in future visits we will be working to taper her medications to have a safe regimen going forward for her.  -Refilled baclofen 20 mg daily -Gabapentin changed from 800 mg BID to 600 mg BID -Tizanidine 6 mg BID d/c'd -Psychiatry to potentially readdress medications/dosages (klonopin, duloxetine, fluoxetine, seroquel, lunesta) -Continue follow ups for refills of these medications   Hypertension 175/81 today in clinic. Denied any symptoms of end organ damage. Said she took her losartan this morning but has been running around most of the day. Says her BP has normal run in 120s/70s-80s. -Advised keeping record of BP at home and potential for increasing losartan at next visit  Symptomatic anemia Last Hgb 10.0 11/3. Has GI appointment at beginning of January. Denies any abdominal pain, lightheadedness or bleeding.  -Continue iron pill -Hgb at GI appointment   February, MD [facility]

## 2021-10-21 NOTE — Assessment & Plan Note (Signed)
Last Hgb 10.0 11/3. Has GI appointment at beginning of January. Denies any abdominal pain, lightheadedness or bleeding.  -Continue iron pill -Hgb at GI appointment

## 2021-10-22 ENCOUNTER — Other Ambulatory Visit (HOSPITAL_COMMUNITY): Payer: Self-pay

## 2021-10-23 ENCOUNTER — Other Ambulatory Visit (HOSPITAL_COMMUNITY): Payer: Self-pay

## 2021-10-31 ENCOUNTER — Other Ambulatory Visit: Payer: Self-pay | Admitting: Family Medicine

## 2021-10-31 ENCOUNTER — Other Ambulatory Visit: Payer: Self-pay | Admitting: Student

## 2021-10-31 ENCOUNTER — Other Ambulatory Visit: Payer: Self-pay

## 2021-10-31 ENCOUNTER — Other Ambulatory Visit (HOSPITAL_COMMUNITY): Payer: Self-pay

## 2021-10-31 DIAGNOSIS — J309 Allergic rhinitis, unspecified: Secondary | ICD-10-CM

## 2021-10-31 MED ORDER — RIZATRIPTAN BENZOATE 10 MG PO TABS
10.0000 mg | ORAL_TABLET | ORAL | 0 refills | Status: AC | PRN
  Filled 2021-10-31: qty 10, 30d supply, fill #0

## 2021-10-31 MED ORDER — MONTELUKAST SODIUM 10 MG PO TABS
10.0000 mg | ORAL_TABLET | Freq: Every day | ORAL | 3 refills | Status: AC
  Filled 2021-10-31: qty 30, 30d supply, fill #0
  Filled 2021-12-03: qty 30, 30d supply, fill #1
  Filled 2022-01-05: qty 30, 30d supply, fill #2
  Filled 2022-02-04: qty 30, 30d supply, fill #3

## 2021-11-03 ENCOUNTER — Other Ambulatory Visit (HOSPITAL_COMMUNITY): Payer: Self-pay

## 2021-11-03 ENCOUNTER — Other Ambulatory Visit: Payer: Self-pay

## 2021-11-07 ENCOUNTER — Other Ambulatory Visit (HOSPITAL_COMMUNITY): Payer: Self-pay

## 2021-11-07 DIAGNOSIS — Z6826 Body mass index (BMI) 26.0-26.9, adult: Secondary | ICD-10-CM | POA: Diagnosis not present

## 2021-11-07 DIAGNOSIS — I10 Essential (primary) hypertension: Secondary | ICD-10-CM | POA: Diagnosis not present

## 2021-11-07 DIAGNOSIS — Z7689 Persons encountering health services in other specified circumstances: Secondary | ICD-10-CM | POA: Diagnosis not present

## 2021-11-07 DIAGNOSIS — M797 Fibromyalgia: Secondary | ICD-10-CM | POA: Diagnosis not present

## 2021-11-12 ENCOUNTER — Other Ambulatory Visit (HOSPITAL_COMMUNITY): Payer: Self-pay

## 2021-11-12 ENCOUNTER — Other Ambulatory Visit: Payer: Self-pay

## 2021-11-13 ENCOUNTER — Other Ambulatory Visit (HOSPITAL_COMMUNITY): Payer: Self-pay

## 2021-11-13 DIAGNOSIS — I10 Essential (primary) hypertension: Secondary | ICD-10-CM | POA: Diagnosis not present

## 2021-11-13 DIAGNOSIS — Z79899 Other long term (current) drug therapy: Secondary | ICD-10-CM | POA: Diagnosis not present

## 2021-11-13 DIAGNOSIS — M797 Fibromyalgia: Secondary | ICD-10-CM | POA: Diagnosis not present

## 2021-11-13 DIAGNOSIS — G43709 Chronic migraine without aura, not intractable, without status migrainosus: Secondary | ICD-10-CM | POA: Diagnosis not present

## 2021-11-13 DIAGNOSIS — F3160 Bipolar disorder, current episode mixed, unspecified: Secondary | ICD-10-CM | POA: Diagnosis not present

## 2021-11-13 MED ORDER — UBRELVY 50 MG PO TABS
50.0000 mg | ORAL_TABLET | ORAL | 1 refills | Status: AC
Start: 2021-11-13 — End: ?
  Filled 2021-11-13 – 2021-11-25 (×2): qty 10, 30d supply, fill #0
  Filled 2021-12-23: qty 10, 30d supply, fill #1
  Filled 2022-01-29: qty 10, 30d supply, fill #2
  Filled 2022-04-30 – 2022-05-20 (×2): qty 10, 30d supply, fill #3
  Filled 2022-07-10 – 2022-10-30 (×3): qty 10, 30d supply, fill #4

## 2021-11-13 MED ORDER — PREGABALIN 150 MG PO CAPS
150.0000 mg | ORAL_CAPSULE | Freq: Two times a day (BID) | ORAL | 1 refills | Status: DC
Start: 2021-11-13 — End: 2022-05-25
  Filled 2021-11-13 – 2021-11-20 (×3): qty 60, 30d supply, fill #0

## 2021-11-14 ENCOUNTER — Other Ambulatory Visit: Payer: Self-pay

## 2021-11-14 ENCOUNTER — Other Ambulatory Visit (HOSPITAL_COMMUNITY): Payer: Self-pay

## 2021-11-17 DIAGNOSIS — Z79899 Other long term (current) drug therapy: Secondary | ICD-10-CM | POA: Diagnosis not present

## 2021-11-18 ENCOUNTER — Ambulatory Visit: Payer: 59 | Admitting: Orthopaedic Surgery

## 2021-11-19 ENCOUNTER — Other Ambulatory Visit (HOSPITAL_COMMUNITY): Payer: Self-pay

## 2021-11-20 ENCOUNTER — Other Ambulatory Visit (HOSPITAL_COMMUNITY): Payer: Self-pay

## 2021-11-25 ENCOUNTER — Other Ambulatory Visit (HOSPITAL_COMMUNITY): Payer: Self-pay

## 2021-12-03 ENCOUNTER — Other Ambulatory Visit (HOSPITAL_COMMUNITY): Payer: Self-pay

## 2021-12-08 ENCOUNTER — Other Ambulatory Visit (HOSPITAL_COMMUNITY): Payer: Self-pay

## 2021-12-11 ENCOUNTER — Other Ambulatory Visit (HOSPITAL_COMMUNITY): Payer: Self-pay

## 2021-12-19 DIAGNOSIS — I10 Essential (primary) hypertension: Secondary | ICD-10-CM | POA: Diagnosis not present

## 2021-12-19 DIAGNOSIS — E7800 Pure hypercholesterolemia, unspecified: Secondary | ICD-10-CM | POA: Diagnosis not present

## 2021-12-19 DIAGNOSIS — E119 Type 2 diabetes mellitus without complications: Secondary | ICD-10-CM | POA: Diagnosis not present

## 2021-12-19 DIAGNOSIS — F3160 Bipolar disorder, current episode mixed, unspecified: Secondary | ICD-10-CM | POA: Diagnosis not present

## 2021-12-19 DIAGNOSIS — M797 Fibromyalgia: Secondary | ICD-10-CM | POA: Diagnosis not present

## 2021-12-22 ENCOUNTER — Other Ambulatory Visit (HOSPITAL_COMMUNITY): Payer: Self-pay

## 2021-12-23 ENCOUNTER — Other Ambulatory Visit (HOSPITAL_COMMUNITY): Payer: Self-pay

## 2021-12-27 DIAGNOSIS — Z6830 Body mass index (BMI) 30.0-30.9, adult: Secondary | ICD-10-CM | POA: Diagnosis not present

## 2021-12-27 DIAGNOSIS — E782 Mixed hyperlipidemia: Secondary | ICD-10-CM | POA: Diagnosis not present

## 2021-12-27 DIAGNOSIS — E119 Type 2 diabetes mellitus without complications: Secondary | ICD-10-CM | POA: Diagnosis not present

## 2021-12-27 DIAGNOSIS — I10 Essential (primary) hypertension: Secondary | ICD-10-CM | POA: Diagnosis not present

## 2021-12-27 DIAGNOSIS — E1169 Type 2 diabetes mellitus with other specified complication: Secondary | ICD-10-CM | POA: Diagnosis not present

## 2022-01-02 DIAGNOSIS — E119 Type 2 diabetes mellitus without complications: Secondary | ICD-10-CM | POA: Diagnosis not present

## 2022-01-02 DIAGNOSIS — M542 Cervicalgia: Secondary | ICD-10-CM | POA: Diagnosis not present

## 2022-01-02 DIAGNOSIS — A46 Erysipelas: Secondary | ICD-10-CM | POA: Diagnosis not present

## 2022-01-02 DIAGNOSIS — I10 Essential (primary) hypertension: Secondary | ICD-10-CM | POA: Diagnosis not present

## 2022-01-04 DIAGNOSIS — I10 Essential (primary) hypertension: Secondary | ICD-10-CM | POA: Diagnosis not present

## 2022-01-04 DIAGNOSIS — A46 Erysipelas: Secondary | ICD-10-CM | POA: Diagnosis not present

## 2022-01-04 DIAGNOSIS — Z6829 Body mass index (BMI) 29.0-29.9, adult: Secondary | ICD-10-CM | POA: Diagnosis not present

## 2022-01-04 DIAGNOSIS — D539 Nutritional anemia, unspecified: Secondary | ICD-10-CM | POA: Diagnosis not present

## 2022-01-05 ENCOUNTER — Other Ambulatory Visit (HOSPITAL_COMMUNITY): Payer: Self-pay

## 2022-01-05 MED ORDER — DEXCOM G6 RECEIVER DEVI
1.0000 | 5 refills | Status: AC
  Filled 2022-01-05 – 2022-01-15 (×4): qty 1, 90d supply, fill #0
  Filled 2022-01-23: qty 1, 1d supply, fill #0

## 2022-01-05 MED ORDER — DEXCOM G6 SENSOR MISC
5 refills | Status: AC
  Filled 2022-01-05 – 2022-01-15 (×3): qty 3, 30d supply, fill #0
  Filled 2022-02-15: qty 3, 30d supply, fill #1
  Filled 2022-03-14: qty 3, 30d supply, fill #2
  Filled 2022-04-08: qty 3, 30d supply, fill #3
  Filled 2022-05-04: qty 3, 30d supply, fill #4
  Filled 2022-05-18 – 2022-05-28 (×3): qty 3, 30d supply, fill #5

## 2022-01-05 MED ORDER — DEXCOM G6 TRANSMITTER MISC
1 refills | Status: DC
  Filled 2022-01-05: qty 1, 30d supply, fill #0
  Filled 2022-01-15: qty 1, 90d supply, fill #0
  Filled 2022-04-30: qty 1, 90d supply, fill #1
  Filled 2022-05-29: qty 1, 90d supply, fill #2

## 2022-01-07 ENCOUNTER — Other Ambulatory Visit (HOSPITAL_COMMUNITY): Payer: Self-pay

## 2022-01-07 DIAGNOSIS — I10 Essential (primary) hypertension: Secondary | ICD-10-CM | POA: Diagnosis not present

## 2022-01-07 DIAGNOSIS — F3160 Bipolar disorder, current episode mixed, unspecified: Secondary | ICD-10-CM | POA: Diagnosis not present

## 2022-01-07 DIAGNOSIS — A46 Erysipelas: Secondary | ICD-10-CM | POA: Diagnosis not present

## 2022-01-07 DIAGNOSIS — E611 Iron deficiency: Secondary | ICD-10-CM | POA: Diagnosis not present

## 2022-01-08 ENCOUNTER — Other Ambulatory Visit (HOSPITAL_COMMUNITY): Payer: Self-pay

## 2022-01-14 ENCOUNTER — Other Ambulatory Visit (HOSPITAL_COMMUNITY): Payer: Self-pay

## 2022-01-15 ENCOUNTER — Other Ambulatory Visit (HOSPITAL_COMMUNITY): Payer: Self-pay

## 2022-01-16 ENCOUNTER — Other Ambulatory Visit (HOSPITAL_COMMUNITY): Payer: Self-pay

## 2022-01-16 MED ORDER — TRULICITY 0.75 MG/0.5ML ~~LOC~~ SOAJ
0.7500 mg | SUBCUTANEOUS | 10 refills | Status: DC
  Filled 2022-01-16: qty 2, 28d supply, fill #0
  Filled 2022-02-15: qty 2, 28d supply, fill #1
  Filled 2022-03-16: qty 2, 28d supply, fill #2
  Filled 2022-04-26: qty 2, 28d supply, fill #3

## 2022-01-16 MED ORDER — PREGABALIN 200 MG PO CAPS
200.0000 mg | ORAL_CAPSULE | Freq: Two times a day (BID) | ORAL | 1 refills | Status: DC
  Filled 2022-01-16: qty 60, 30d supply, fill #0

## 2022-01-19 ENCOUNTER — Other Ambulatory Visit (HOSPITAL_COMMUNITY): Payer: Self-pay

## 2022-01-20 ENCOUNTER — Other Ambulatory Visit (HOSPITAL_COMMUNITY): Payer: Self-pay

## 2022-01-23 ENCOUNTER — Other Ambulatory Visit (HOSPITAL_COMMUNITY): Payer: Self-pay

## 2022-01-29 ENCOUNTER — Other Ambulatory Visit (HOSPITAL_COMMUNITY): Payer: Self-pay

## 2022-01-29 MED ORDER — ICOSAPENT ETHYL 1 G PO CAPS
2.0000 g | ORAL_CAPSULE | Freq: Two times a day (BID) | ORAL | 1 refills | Status: AC
  Filled 2022-01-29: qty 360, 90d supply, fill #0

## 2022-02-04 ENCOUNTER — Other Ambulatory Visit (HOSPITAL_COMMUNITY): Payer: Self-pay

## 2022-02-04 DIAGNOSIS — F1320 Sedative, hypnotic or anxiolytic dependence, uncomplicated: Secondary | ICD-10-CM | POA: Diagnosis not present

## 2022-02-04 DIAGNOSIS — F3160 Bipolar disorder, current episode mixed, unspecified: Secondary | ICD-10-CM | POA: Diagnosis not present

## 2022-02-04 DIAGNOSIS — M797 Fibromyalgia: Secondary | ICD-10-CM | POA: Diagnosis not present

## 2022-02-04 DIAGNOSIS — G43709 Chronic migraine without aura, not intractable, without status migrainosus: Secondary | ICD-10-CM | POA: Diagnosis not present

## 2022-02-05 ENCOUNTER — Other Ambulatory Visit (HOSPITAL_COMMUNITY): Payer: Self-pay

## 2022-02-05 MED ORDER — QUETIAPINE FUMARATE ER 300 MG PO TB24
300.0000 mg | ORAL_TABLET | Freq: Every evening | ORAL | 1 refills | Status: AC
  Filled 2022-02-05: qty 60, 60d supply, fill #0
  Filled 2022-03-28: qty 60, 60d supply, fill #1

## 2022-02-05 MED ORDER — PREGABALIN 200 MG PO CAPS
200.0000 mg | ORAL_CAPSULE | Freq: Two times a day (BID) | ORAL | 1 refills | Status: DC
  Filled 2022-02-15: qty 180, 90d supply, fill #0

## 2022-02-05 MED ORDER — DULOXETINE HCL 60 MG PO CPEP
60.0000 mg | ORAL_CAPSULE | Freq: Every day | ORAL | 1 refills | Status: DC
  Filled 2022-02-05: qty 90, 90d supply, fill #0
  Filled 2022-04-26: qty 90, 90d supply, fill #1

## 2022-02-05 MED ORDER — UBRELVY 100 MG PO TABS
ORAL_TABLET | ORAL | 1 refills | Status: DC
  Filled 2022-02-05: qty 15, 30d supply, fill #0
  Filled 2022-03-05: qty 15, 30d supply, fill #1

## 2022-02-05 MED ORDER — CLONAZEPAM 0.5 MG PO TABS
0.5000 mg | ORAL_TABLET | Freq: Two times a day (BID) | ORAL | 1 refills | Status: AC
  Filled 2022-02-06: qty 60, 30d supply, fill #0

## 2022-02-06 ENCOUNTER — Other Ambulatory Visit (HOSPITAL_COMMUNITY): Payer: Self-pay

## 2022-02-06 MED ORDER — METFORMIN HCL 1000 MG PO TABS
1000.0000 mg | ORAL_TABLET | Freq: Two times a day (BID) | ORAL | 1 refills | Status: AC
  Filled 2022-02-06: qty 180, 90d supply, fill #0

## 2022-02-11 ENCOUNTER — Other Ambulatory Visit (HOSPITAL_COMMUNITY): Payer: Self-pay

## 2022-02-16 ENCOUNTER — Other Ambulatory Visit (HOSPITAL_COMMUNITY): Payer: Self-pay

## 2022-02-18 ENCOUNTER — Other Ambulatory Visit (HOSPITAL_COMMUNITY): Payer: Self-pay

## 2022-02-18 DIAGNOSIS — E119 Type 2 diabetes mellitus without complications: Secondary | ICD-10-CM | POA: Diagnosis not present

## 2022-02-18 DIAGNOSIS — G43709 Chronic migraine without aura, not intractable, without status migrainosus: Secondary | ICD-10-CM | POA: Diagnosis not present

## 2022-02-18 DIAGNOSIS — M797 Fibromyalgia: Secondary | ICD-10-CM | POA: Diagnosis not present

## 2022-02-18 DIAGNOSIS — F3160 Bipolar disorder, current episode mixed, unspecified: Secondary | ICD-10-CM | POA: Diagnosis not present

## 2022-02-18 MED ORDER — CLONAZEPAM 0.5 MG PO TABS
0.5000 mg | ORAL_TABLET | Freq: Two times a day (BID) | ORAL | 1 refills | Status: DC
  Filled 2022-03-06: qty 75, 37d supply, fill #0
  Filled 2022-03-06: qty 105, 53d supply, fill #0
  Filled 2022-06-02: qty 180, 90d supply, fill #1

## 2022-02-18 MED ORDER — PREGABALIN 200 MG PO CAPS
200.0000 mg | ORAL_CAPSULE | Freq: Three times a day (TID) | ORAL | 1 refills | Status: AC
Start: 2022-02-18 — End: ?

## 2022-02-18 MED ORDER — PROCHLORPERAZINE MALEATE 10 MG PO TABS
10.0000 mg | ORAL_TABLET | Freq: Three times a day (TID) | ORAL | 5 refills | Status: AC | PRN
  Filled 2022-02-18: qty 90, 30d supply, fill #0
  Filled 2022-10-11: qty 90, 30d supply, fill #1

## 2022-02-20 ENCOUNTER — Other Ambulatory Visit (HOSPITAL_COMMUNITY): Payer: Self-pay

## 2022-03-06 ENCOUNTER — Other Ambulatory Visit (HOSPITAL_COMMUNITY): Payer: Self-pay

## 2022-03-11 ENCOUNTER — Other Ambulatory Visit (HOSPITAL_COMMUNITY): Payer: Self-pay

## 2022-03-11 DIAGNOSIS — G4730 Sleep apnea, unspecified: Secondary | ICD-10-CM | POA: Diagnosis not present

## 2022-03-11 DIAGNOSIS — Z6829 Body mass index (BMI) 29.0-29.9, adult: Secondary | ICD-10-CM | POA: Diagnosis not present

## 2022-03-11 DIAGNOSIS — F3160 Bipolar disorder, current episode mixed, unspecified: Secondary | ICD-10-CM | POA: Diagnosis not present

## 2022-03-11 DIAGNOSIS — E119 Type 2 diabetes mellitus without complications: Secondary | ICD-10-CM | POA: Diagnosis not present

## 2022-03-11 DIAGNOSIS — F1320 Sedative, hypnotic or anxiolytic dependence, uncomplicated: Secondary | ICD-10-CM | POA: Diagnosis not present

## 2022-03-11 DIAGNOSIS — I10 Essential (primary) hypertension: Secondary | ICD-10-CM | POA: Diagnosis not present

## 2022-03-11 DIAGNOSIS — M545 Low back pain, unspecified: Secondary | ICD-10-CM | POA: Diagnosis not present

## 2022-03-11 DIAGNOSIS — M797 Fibromyalgia: Secondary | ICD-10-CM | POA: Diagnosis not present

## 2022-03-11 MED ORDER — MELOXICAM 7.5 MG PO TABS
7.5000 mg | ORAL_TABLET | Freq: Every day | ORAL | 1 refills | Status: DC
  Filled 2022-03-11: qty 30, 30d supply, fill #0
  Filled 2022-04-05: qty 30, 30d supply, fill #1

## 2022-03-11 MED ORDER — QUETIAPINE FUMARATE ER 300 MG PO TB24
300.0000 mg | ORAL_TABLET | Freq: Every evening | ORAL | 1 refills | Status: AC
  Filled 2022-03-11: qty 90, 90d supply, fill #0

## 2022-03-11 MED ORDER — PREGABALIN 200 MG PO CAPS
200.0000 mg | ORAL_CAPSULE | Freq: Three times a day (TID) | ORAL | 1 refills | Status: AC
  Filled 2022-03-11 – 2022-03-16 (×2): qty 270, 90d supply, fill #0
  Filled 2022-07-17: qty 270, 90d supply, fill #1

## 2022-03-11 MED ORDER — TIZANIDINE HCL 6 MG PO CAPS
6.0000 mg | ORAL_CAPSULE | Freq: Four times a day (QID) | ORAL | 1 refills | Status: DC | PRN
  Filled 2022-03-11: qty 90, 30d supply, fill #0
  Filled 2022-04-26: qty 90, 30d supply, fill #1
  Filled 2022-06-03: qty 60, 20d supply, fill #2

## 2022-03-13 DIAGNOSIS — F1320 Sedative, hypnotic or anxiolytic dependence, uncomplicated: Secondary | ICD-10-CM | POA: Diagnosis not present

## 2022-03-16 ENCOUNTER — Other Ambulatory Visit (HOSPITAL_COMMUNITY): Payer: Self-pay

## 2022-03-17 ENCOUNTER — Other Ambulatory Visit (HOSPITAL_COMMUNITY): Payer: Self-pay

## 2022-03-20 NOTE — Telephone Encounter (Signed)
Sent to pcp for request 

## 2022-03-25 ENCOUNTER — Other Ambulatory Visit (HOSPITAL_COMMUNITY): Payer: Self-pay

## 2022-03-25 ENCOUNTER — Other Ambulatory Visit: Payer: Self-pay | Admitting: Student

## 2022-03-25 DIAGNOSIS — J309 Allergic rhinitis, unspecified: Secondary | ICD-10-CM

## 2022-03-25 DIAGNOSIS — S22080A Wedge compression fracture of T11-T12 vertebra, initial encounter for closed fracture: Secondary | ICD-10-CM | POA: Diagnosis not present

## 2022-03-26 ENCOUNTER — Other Ambulatory Visit (HOSPITAL_COMMUNITY): Payer: Self-pay

## 2022-03-26 MED ORDER — QULIPTA 60 MG PO TABS
60.0000 mg | ORAL_TABLET | Freq: Every day | ORAL | 1 refills | Status: AC
  Filled 2022-03-26 – 2022-03-31 (×2): qty 90, 90d supply, fill #0
  Filled 2022-04-09 – 2022-05-20 (×2): qty 30, 30d supply, fill #0
  Filled 2022-05-20 (×2): qty 90, 90d supply, fill #0
  Filled 2022-08-14: qty 90, 90d supply, fill #1

## 2022-03-26 MED ORDER — MONTELUKAST SODIUM 10 MG PO TABS
10.0000 mg | ORAL_TABLET | Freq: Every day | ORAL | 1 refills | Status: DC
  Filled 2022-03-26: qty 90, 90d supply, fill #0
  Filled 2022-06-28: qty 90, 90d supply, fill #1

## 2022-03-26 MED ORDER — LINZESS 72 MCG PO CAPS
72.0000 ug | ORAL_CAPSULE | Freq: Every day | ORAL | 1 refills | Status: AC
  Filled 2022-03-26: qty 90, 90d supply, fill #0

## 2022-03-27 ENCOUNTER — Other Ambulatory Visit (HOSPITAL_COMMUNITY): Payer: Self-pay

## 2022-03-27 DIAGNOSIS — K5904 Chronic idiopathic constipation: Secondary | ICD-10-CM | POA: Diagnosis not present

## 2022-03-27 DIAGNOSIS — Z6830 Body mass index (BMI) 30.0-30.9, adult: Secondary | ICD-10-CM | POA: Diagnosis not present

## 2022-03-27 DIAGNOSIS — I10 Essential (primary) hypertension: Secondary | ICD-10-CM | POA: Diagnosis not present

## 2022-03-27 MED ORDER — LINZESS 145 MCG PO CAPS
145.0000 ug | ORAL_CAPSULE | Freq: Every day | ORAL | 1 refills | Status: AC
Start: 2022-03-27 — End: ?
  Filled 2022-03-27: qty 30, 30d supply, fill #0
  Filled 2022-06-02: qty 30, 30d supply, fill #1
  Filled 2022-06-28: qty 30, 30d supply, fill #2
  Filled 2022-07-29: qty 30, 30d supply, fill #3
  Filled 2022-09-01: qty 30, 30d supply, fill #4
  Filled 2022-10-15: qty 30, 30d supply, fill #5

## 2022-03-27 MED ORDER — POLYETHYLENE GLYCOL 3350 17 GM/SCOOP PO POWD
17.0000 g | Freq: Every day | ORAL | 5 refills | Status: AC
  Filled 2022-03-27: qty 714, 42d supply, fill #0

## 2022-03-31 ENCOUNTER — Other Ambulatory Visit (HOSPITAL_COMMUNITY): Payer: Self-pay

## 2022-04-06 ENCOUNTER — Other Ambulatory Visit (HOSPITAL_COMMUNITY): Payer: Self-pay

## 2022-04-07 ENCOUNTER — Encounter: Payer: Self-pay | Admitting: *Deleted

## 2022-04-08 ENCOUNTER — Other Ambulatory Visit (HOSPITAL_COMMUNITY): Payer: Self-pay

## 2022-04-09 ENCOUNTER — Other Ambulatory Visit (HOSPITAL_COMMUNITY): Payer: Self-pay

## 2022-04-15 DIAGNOSIS — G43709 Chronic migraine without aura, not intractable, without status migrainosus: Secondary | ICD-10-CM | POA: Diagnosis not present

## 2022-04-15 DIAGNOSIS — Z6828 Body mass index (BMI) 28.0-28.9, adult: Secondary | ICD-10-CM | POA: Diagnosis not present

## 2022-04-15 DIAGNOSIS — E1169 Type 2 diabetes mellitus with other specified complication: Secondary | ICD-10-CM | POA: Diagnosis not present

## 2022-04-15 DIAGNOSIS — I10 Essential (primary) hypertension: Secondary | ICD-10-CM | POA: Diagnosis not present

## 2022-04-15 DIAGNOSIS — E119 Type 2 diabetes mellitus without complications: Secondary | ICD-10-CM | POA: Diagnosis not present

## 2022-04-15 DIAGNOSIS — K5904 Chronic idiopathic constipation: Secondary | ICD-10-CM | POA: Diagnosis not present

## 2022-04-15 DIAGNOSIS — M810 Age-related osteoporosis without current pathological fracture: Secondary | ICD-10-CM | POA: Diagnosis not present

## 2022-04-15 DIAGNOSIS — E782 Mixed hyperlipidemia: Secondary | ICD-10-CM | POA: Diagnosis not present

## 2022-04-27 ENCOUNTER — Other Ambulatory Visit (HOSPITAL_COMMUNITY): Payer: Self-pay

## 2022-04-30 ENCOUNTER — Other Ambulatory Visit (HOSPITAL_COMMUNITY): Payer: Self-pay

## 2022-05-01 ENCOUNTER — Other Ambulatory Visit (HOSPITAL_COMMUNITY): Payer: Self-pay

## 2022-05-01 DIAGNOSIS — I10 Essential (primary) hypertension: Secondary | ICD-10-CM | POA: Diagnosis not present

## 2022-05-01 DIAGNOSIS — E119 Type 2 diabetes mellitus without complications: Secondary | ICD-10-CM | POA: Diagnosis not present

## 2022-05-01 DIAGNOSIS — Z6829 Body mass index (BMI) 29.0-29.9, adult: Secondary | ICD-10-CM | POA: Diagnosis not present

## 2022-05-01 MED ORDER — TRULICITY 1.5 MG/0.5ML ~~LOC~~ SOPN
1.5000 mg | PEN_INJECTOR | SUBCUTANEOUS | 1 refills | Status: AC
Start: 2022-05-01 — End: ?
  Filled 2022-05-01: qty 2, 28d supply, fill #0
  Filled 2022-05-24: qty 2, 28d supply, fill #1

## 2022-05-04 ENCOUNTER — Other Ambulatory Visit (HOSPITAL_COMMUNITY): Payer: Self-pay

## 2022-05-06 ENCOUNTER — Other Ambulatory Visit (HOSPITAL_COMMUNITY): Payer: Self-pay

## 2022-05-07 ENCOUNTER — Other Ambulatory Visit (HOSPITAL_COMMUNITY): Payer: Self-pay

## 2022-05-07 MED ORDER — MELOXICAM 7.5 MG PO TABS
7.5000 mg | ORAL_TABLET | Freq: Every day | ORAL | 1 refills | Status: DC
  Filled 2022-05-07: qty 90, 90d supply, fill #0
  Filled 2022-07-24: qty 90, 90d supply, fill #1

## 2022-05-08 ENCOUNTER — Other Ambulatory Visit (HOSPITAL_COMMUNITY): Payer: Self-pay

## 2022-05-18 ENCOUNTER — Other Ambulatory Visit (HOSPITAL_COMMUNITY): Payer: Self-pay

## 2022-05-20 ENCOUNTER — Other Ambulatory Visit (HOSPITAL_COMMUNITY): Payer: Self-pay

## 2022-05-21 ENCOUNTER — Other Ambulatory Visit (HOSPITAL_COMMUNITY): Payer: Self-pay

## 2022-05-22 ENCOUNTER — Other Ambulatory Visit (HOSPITAL_COMMUNITY): Payer: Self-pay

## 2022-05-25 ENCOUNTER — Other Ambulatory Visit (HOSPITAL_COMMUNITY): Payer: Self-pay

## 2022-05-27 ENCOUNTER — Other Ambulatory Visit (HOSPITAL_COMMUNITY): Payer: Self-pay

## 2022-05-28 ENCOUNTER — Other Ambulatory Visit (HOSPITAL_COMMUNITY): Payer: Self-pay

## 2022-05-28 DIAGNOSIS — F3160 Bipolar disorder, current episode mixed, unspecified: Secondary | ICD-10-CM | POA: Diagnosis not present

## 2022-05-28 DIAGNOSIS — E119 Type 2 diabetes mellitus without complications: Secondary | ICD-10-CM | POA: Diagnosis not present

## 2022-05-28 DIAGNOSIS — M797 Fibromyalgia: Secondary | ICD-10-CM | POA: Diagnosis not present

## 2022-05-28 DIAGNOSIS — G43709 Chronic migraine without aura, not intractable, without status migrainosus: Secondary | ICD-10-CM | POA: Diagnosis not present

## 2022-05-28 DIAGNOSIS — J309 Allergic rhinitis, unspecified: Secondary | ICD-10-CM | POA: Diagnosis not present

## 2022-05-28 DIAGNOSIS — Z6828 Body mass index (BMI) 28.0-28.9, adult: Secondary | ICD-10-CM | POA: Diagnosis not present

## 2022-05-28 DIAGNOSIS — E782 Mixed hyperlipidemia: Secondary | ICD-10-CM | POA: Diagnosis not present

## 2022-05-28 DIAGNOSIS — E1169 Type 2 diabetes mellitus with other specified complication: Secondary | ICD-10-CM | POA: Diagnosis not present

## 2022-05-28 DIAGNOSIS — I10 Essential (primary) hypertension: Secondary | ICD-10-CM | POA: Diagnosis not present

## 2022-05-28 MED ORDER — QULIPTA 60 MG PO TABS
1.0000 | ORAL_TABLET | Freq: Every day | ORAL | 1 refills | Status: AC
  Filled 2022-05-28: qty 90, 90d supply, fill #0

## 2022-05-28 MED ORDER — PREDNISONE 5 MG PO TABS
ORAL_TABLET | ORAL | 0 refills | Status: AC
  Filled 2022-05-28: qty 21, 6d supply, fill #0

## 2022-05-28 MED ORDER — TRULICITY 3 MG/0.5ML ~~LOC~~ SOAJ
3.0000 mg | SUBCUTANEOUS | 1 refills | Status: AC
  Filled 2022-05-28: qty 2, 28d supply, fill #0
  Filled 2022-06-28: qty 2, 28d supply, fill #1
  Filled 2022-08-11: qty 2, 28d supply, fill #2
  Filled 2022-09-08: qty 2, 28d supply, fill #3
  Filled 2022-10-02: qty 2, 28d supply, fill #4
  Filled 2022-10-30: qty 2, 28d supply, fill #5

## 2022-05-28 MED ORDER — PREGABALIN 200 MG PO CAPS
200.0000 mg | ORAL_CAPSULE | Freq: Three times a day (TID) | ORAL | 1 refills | Status: AC
  Filled 2022-10-01 – 2022-10-13 (×2): qty 270, 90d supply, fill #0

## 2022-05-28 MED ORDER — MONTELUKAST SODIUM 10 MG PO TABS
10.0000 mg | ORAL_TABLET | Freq: Every day | ORAL | 1 refills | Status: AC
  Filled 2022-05-28: qty 90, 90d supply, fill #0

## 2022-05-28 MED ORDER — QUETIAPINE FUMARATE ER 300 MG PO TB24
300.0000 mg | ORAL_TABLET | Freq: Every evening | ORAL | 1 refills | Status: AC
  Filled 2022-05-28: qty 90, 90d supply, fill #0
  Filled 2022-08-14: qty 90, 90d supply, fill #1

## 2022-05-28 MED ORDER — DULOXETINE HCL 60 MG PO CPEP
60.0000 mg | ORAL_CAPSULE | Freq: Every day | ORAL | 1 refills | Status: AC
  Filled 2022-05-28 – 2022-07-16 (×2): qty 90, 90d supply, fill #0
  Filled 2022-10-12: qty 90, 90d supply, fill #1

## 2022-05-28 MED ORDER — DEXCOM G6 SENSOR MISC
1.0000 | 5 refills | Status: AC
  Filled 2022-05-28: qty 3, 30d supply, fill #0

## 2022-05-28 MED ORDER — UBRELVY 100 MG PO TABS
100.0000 mg | ORAL_TABLET | ORAL | 1 refills | Status: AC
  Filled 2022-05-28: qty 30, 30d supply, fill #0

## 2022-05-28 MED ORDER — ICOSAPENT ETHYL 1 G PO CAPS
2.0000 g | ORAL_CAPSULE | Freq: Two times a day (BID) | ORAL | 1 refills | Status: AC
  Filled 2022-05-28 (×2): qty 120, 30d supply, fill #0
  Filled 2022-06-28: qty 120, 30d supply, fill #1
  Filled 2022-09-08: qty 120, 30d supply, fill #2

## 2022-05-29 ENCOUNTER — Other Ambulatory Visit (HOSPITAL_COMMUNITY): Payer: Self-pay

## 2022-05-29 MED ORDER — DEXCOM G6 SENSOR MISC
5 refills | Status: AC
  Filled 2022-05-29 – 2022-07-16 (×2): qty 3, 30d supply, fill #0
  Filled 2022-08-11: qty 3, 30d supply, fill #1
  Filled 2022-09-08: qty 3, 30d supply, fill #2
  Filled 2022-10-20: qty 3, 30d supply, fill #3

## 2022-05-30 ENCOUNTER — Other Ambulatory Visit (HOSPITAL_COMMUNITY): Payer: Self-pay

## 2022-05-30 MED ORDER — DEXCOM G6 TRANSMITTER MISC
1 refills | Status: AC
  Filled 2022-05-30: qty 1, 30d supply, fill #0
  Filled 2022-06-01 – 2022-06-03 (×4): qty 1, 90d supply, fill #0
  Filled 2022-10-07: qty 1, 90d supply, fill #1

## 2022-06-01 ENCOUNTER — Other Ambulatory Visit (HOSPITAL_COMMUNITY): Payer: Self-pay

## 2022-06-02 ENCOUNTER — Other Ambulatory Visit (HOSPITAL_COMMUNITY): Payer: Self-pay

## 2022-06-03 ENCOUNTER — Other Ambulatory Visit (HOSPITAL_BASED_OUTPATIENT_CLINIC_OR_DEPARTMENT_OTHER): Payer: Self-pay

## 2022-06-03 ENCOUNTER — Other Ambulatory Visit (HOSPITAL_COMMUNITY): Payer: Self-pay

## 2022-06-04 ENCOUNTER — Other Ambulatory Visit (HOSPITAL_COMMUNITY): Payer: Self-pay

## 2022-06-08 ENCOUNTER — Other Ambulatory Visit (HOSPITAL_COMMUNITY): Payer: Self-pay

## 2022-06-10 ENCOUNTER — Other Ambulatory Visit (HOSPITAL_COMMUNITY): Payer: Self-pay

## 2022-06-16 ENCOUNTER — Other Ambulatory Visit (HOSPITAL_COMMUNITY): Payer: Self-pay

## 2022-06-17 ENCOUNTER — Other Ambulatory Visit (HOSPITAL_COMMUNITY): Payer: Self-pay

## 2022-06-17 DIAGNOSIS — Z6828 Body mass index (BMI) 28.0-28.9, adult: Secondary | ICD-10-CM | POA: Diagnosis not present

## 2022-06-17 DIAGNOSIS — I10 Essential (primary) hypertension: Secondary | ICD-10-CM | POA: Diagnosis not present

## 2022-06-17 DIAGNOSIS — B354 Tinea corporis: Secondary | ICD-10-CM | POA: Diagnosis not present

## 2022-06-17 MED ORDER — NYSTATIN 100000 UNIT/GM EX OINT
1.0000 | TOPICAL_OINTMENT | Freq: Two times a day (BID) | CUTANEOUS | 5 refills | Status: AC
  Filled 2022-06-17: qty 30, 15d supply, fill #0
  Filled 2022-07-10: qty 30, 15d supply, fill #1

## 2022-06-28 ENCOUNTER — Other Ambulatory Visit (HOSPITAL_COMMUNITY): Payer: Self-pay

## 2022-06-29 ENCOUNTER — Other Ambulatory Visit (HOSPITAL_COMMUNITY): Payer: Self-pay

## 2022-07-01 ENCOUNTER — Other Ambulatory Visit (HOSPITAL_COMMUNITY): Payer: Self-pay

## 2022-07-06 ENCOUNTER — Other Ambulatory Visit (HOSPITAL_COMMUNITY): Payer: Self-pay

## 2022-07-10 ENCOUNTER — Other Ambulatory Visit (HOSPITAL_COMMUNITY): Payer: Self-pay

## 2022-07-14 ENCOUNTER — Other Ambulatory Visit (HOSPITAL_COMMUNITY): Payer: Self-pay

## 2022-07-15 ENCOUNTER — Other Ambulatory Visit (HOSPITAL_COMMUNITY): Payer: Self-pay

## 2022-07-16 ENCOUNTER — Other Ambulatory Visit (HOSPITAL_COMMUNITY): Payer: Self-pay

## 2022-07-16 DIAGNOSIS — Z6829 Body mass index (BMI) 29.0-29.9, adult: Secondary | ICD-10-CM | POA: Diagnosis not present

## 2022-07-16 DIAGNOSIS — I10 Essential (primary) hypertension: Secondary | ICD-10-CM | POA: Diagnosis not present

## 2022-07-16 DIAGNOSIS — F3160 Bipolar disorder, current episode mixed, unspecified: Secondary | ICD-10-CM | POA: Diagnosis not present

## 2022-07-17 ENCOUNTER — Other Ambulatory Visit (HOSPITAL_COMMUNITY): Payer: Self-pay

## 2022-07-20 ENCOUNTER — Other Ambulatory Visit (HOSPITAL_COMMUNITY): Payer: Self-pay

## 2022-07-21 ENCOUNTER — Other Ambulatory Visit (HOSPITAL_COMMUNITY): Payer: Self-pay

## 2022-07-21 MED ORDER — TIZANIDINE HCL 6 MG PO CAPS
6.0000 mg | ORAL_CAPSULE | Freq: Four times a day (QID) | ORAL | 1 refills | Status: AC | PRN
  Filled 2022-07-21: qty 120, 40d supply, fill #0
  Filled 2022-09-17: qty 120, 40d supply, fill #1

## 2022-07-24 ENCOUNTER — Other Ambulatory Visit (HOSPITAL_COMMUNITY): Payer: Self-pay

## 2022-07-28 ENCOUNTER — Other Ambulatory Visit (HOSPITAL_COMMUNITY): Payer: Self-pay

## 2022-07-29 ENCOUNTER — Other Ambulatory Visit (HOSPITAL_COMMUNITY): Payer: Self-pay

## 2022-08-11 ENCOUNTER — Other Ambulatory Visit (HOSPITAL_COMMUNITY): Payer: Self-pay

## 2022-08-12 ENCOUNTER — Other Ambulatory Visit (HOSPITAL_COMMUNITY): Payer: Self-pay

## 2022-08-14 ENCOUNTER — Other Ambulatory Visit (HOSPITAL_COMMUNITY): Payer: Self-pay

## 2022-08-17 ENCOUNTER — Other Ambulatory Visit (HOSPITAL_COMMUNITY): Payer: Self-pay

## 2022-08-18 ENCOUNTER — Other Ambulatory Visit (HOSPITAL_COMMUNITY): Payer: Self-pay

## 2022-08-19 ENCOUNTER — Other Ambulatory Visit (HOSPITAL_COMMUNITY): Payer: Self-pay

## 2022-08-19 DIAGNOSIS — F3160 Bipolar disorder, current episode mixed, unspecified: Secondary | ICD-10-CM | POA: Diagnosis not present

## 2022-08-19 DIAGNOSIS — Z6829 Body mass index (BMI) 29.0-29.9, adult: Secondary | ICD-10-CM | POA: Diagnosis not present

## 2022-08-19 DIAGNOSIS — Z23 Encounter for immunization: Secondary | ICD-10-CM | POA: Diagnosis not present

## 2022-08-19 DIAGNOSIS — N951 Menopausal and female climacteric states: Secondary | ICD-10-CM | POA: Diagnosis not present

## 2022-08-19 DIAGNOSIS — I10 Essential (primary) hypertension: Secondary | ICD-10-CM | POA: Diagnosis not present

## 2022-08-19 MED ORDER — ONDANSETRON 4 MG PO TBDP
4.0000 mg | ORAL_TABLET | Freq: Four times a day (QID) | ORAL | 0 refills | Status: AC | PRN
  Filled 2022-08-19 (×2): qty 60, 15d supply, fill #0

## 2022-08-19 MED ORDER — CLONAZEPAM 0.5 MG PO TABS
0.5000 mg | ORAL_TABLET | Freq: Two times a day (BID) | ORAL | 1 refills | Status: AC
  Filled 2022-09-01: qty 180, 90d supply, fill #0

## 2022-08-21 ENCOUNTER — Other Ambulatory Visit (HOSPITAL_COMMUNITY): Payer: Self-pay

## 2022-09-01 ENCOUNTER — Other Ambulatory Visit (HOSPITAL_COMMUNITY): Payer: Self-pay

## 2022-09-02 ENCOUNTER — Other Ambulatory Visit (HOSPITAL_COMMUNITY): Payer: Self-pay

## 2022-09-09 ENCOUNTER — Other Ambulatory Visit (HOSPITAL_COMMUNITY): Payer: Self-pay

## 2022-09-10 ENCOUNTER — Other Ambulatory Visit (HOSPITAL_BASED_OUTPATIENT_CLINIC_OR_DEPARTMENT_OTHER): Payer: Self-pay

## 2022-09-10 ENCOUNTER — Other Ambulatory Visit (HOSPITAL_COMMUNITY): Payer: Self-pay

## 2022-09-11 ENCOUNTER — Other Ambulatory Visit (HOSPITAL_COMMUNITY): Payer: Self-pay

## 2022-09-11 DIAGNOSIS — E119 Type 2 diabetes mellitus without complications: Secondary | ICD-10-CM | POA: Diagnosis not present

## 2022-09-11 DIAGNOSIS — L03019 Cellulitis of unspecified finger: Secondary | ICD-10-CM | POA: Diagnosis not present

## 2022-09-11 DIAGNOSIS — E1169 Type 2 diabetes mellitus with other specified complication: Secondary | ICD-10-CM | POA: Diagnosis not present

## 2022-09-11 DIAGNOSIS — E782 Mixed hyperlipidemia: Secondary | ICD-10-CM | POA: Diagnosis not present

## 2022-09-11 DIAGNOSIS — F3160 Bipolar disorder, current episode mixed, unspecified: Secondary | ICD-10-CM | POA: Diagnosis not present

## 2022-09-11 DIAGNOSIS — I10 Essential (primary) hypertension: Secondary | ICD-10-CM | POA: Diagnosis not present

## 2022-09-11 DIAGNOSIS — Z6830 Body mass index (BMI) 30.0-30.9, adult: Secondary | ICD-10-CM | POA: Diagnosis not present

## 2022-09-11 MED ORDER — DOXYCYCLINE HYCLATE 100 MG PO TABS
100.0000 mg | ORAL_TABLET | Freq: Two times a day (BID) | ORAL | 0 refills | Status: AC
  Filled 2022-09-11: qty 28, 14d supply, fill #0

## 2022-09-11 MED ORDER — KETOROLAC TROMETHAMINE 10 MG PO TABS
10.0000 mg | ORAL_TABLET | Freq: Four times a day (QID) | ORAL | 1 refills | Status: AC | PRN
  Filled 2022-09-11: qty 20, 5d supply, fill #0

## 2022-09-17 ENCOUNTER — Other Ambulatory Visit (HOSPITAL_COMMUNITY): Payer: Self-pay

## 2022-09-17 DIAGNOSIS — Z6829 Body mass index (BMI) 29.0-29.9, adult: Secondary | ICD-10-CM | POA: Diagnosis not present

## 2022-09-17 DIAGNOSIS — L03019 Cellulitis of unspecified finger: Secondary | ICD-10-CM | POA: Diagnosis not present

## 2022-09-17 DIAGNOSIS — G43709 Chronic migraine without aura, not intractable, without status migrainosus: Secondary | ICD-10-CM | POA: Diagnosis not present

## 2022-09-17 DIAGNOSIS — I10 Essential (primary) hypertension: Secondary | ICD-10-CM | POA: Diagnosis not present

## 2022-09-18 ENCOUNTER — Other Ambulatory Visit (HOSPITAL_COMMUNITY): Payer: Self-pay

## 2022-09-18 MED ORDER — UBRELVY 100 MG PO TABS
100.0000 mg | ORAL_TABLET | ORAL | 1 refills | Status: AC
  Filled 2022-09-18: qty 45, 45d supply, fill #0

## 2022-09-18 MED ORDER — QULIPTA 60 MG PO TABS
60.0000 mg | ORAL_TABLET | Freq: Every day | ORAL | 1 refills | Status: AC
  Filled 2022-09-18: qty 90, 90d supply, fill #0

## 2022-09-21 ENCOUNTER — Other Ambulatory Visit (HOSPITAL_COMMUNITY): Payer: Self-pay

## 2022-09-21 DIAGNOSIS — M2011 Hallux valgus (acquired), right foot: Secondary | ICD-10-CM | POA: Diagnosis not present

## 2022-09-21 DIAGNOSIS — L03032 Cellulitis of left toe: Secondary | ICD-10-CM | POA: Diagnosis not present

## 2022-09-21 DIAGNOSIS — L02612 Cutaneous abscess of left foot: Secondary | ICD-10-CM | POA: Diagnosis not present

## 2022-09-21 DIAGNOSIS — B351 Tinea unguium: Secondary | ICD-10-CM | POA: Diagnosis not present

## 2022-09-21 MED ORDER — MUPIROCIN 2 % EX OINT
1.0000 | TOPICAL_OINTMENT | Freq: Every day | CUTANEOUS | 0 refills | Status: AC
  Filled 2022-09-21: qty 22, 30d supply, fill #0

## 2022-09-22 ENCOUNTER — Other Ambulatory Visit (HOSPITAL_COMMUNITY): Payer: Self-pay

## 2022-09-22 MED ORDER — UBRELVY 100 MG PO TABS
100.0000 mg | ORAL_TABLET | ORAL | 1 refills | Status: AC
  Filled 2022-09-22: qty 9, 30d supply, fill #0

## 2022-10-01 ENCOUNTER — Other Ambulatory Visit (HOSPITAL_COMMUNITY): Payer: Self-pay

## 2022-10-02 ENCOUNTER — Other Ambulatory Visit (HOSPITAL_COMMUNITY): Payer: Self-pay

## 2022-10-02 MED ORDER — MONTELUKAST SODIUM 10 MG PO TABS
10.0000 mg | ORAL_TABLET | Freq: Every day | ORAL | 1 refills | Status: AC
  Filled 2022-10-02: qty 90, 90d supply, fill #0

## 2022-10-07 ENCOUNTER — Other Ambulatory Visit (HOSPITAL_COMMUNITY): Payer: Self-pay

## 2022-10-08 DIAGNOSIS — I10 Essential (primary) hypertension: Secondary | ICD-10-CM | POA: Diagnosis not present

## 2022-10-08 DIAGNOSIS — J069 Acute upper respiratory infection, unspecified: Secondary | ICD-10-CM | POA: Diagnosis not present

## 2022-10-08 DIAGNOSIS — Z6829 Body mass index (BMI) 29.0-29.9, adult: Secondary | ICD-10-CM | POA: Diagnosis not present

## 2022-10-08 DIAGNOSIS — J018 Other acute sinusitis: Secondary | ICD-10-CM | POA: Diagnosis not present

## 2022-10-12 ENCOUNTER — Other Ambulatory Visit (HOSPITAL_COMMUNITY): Payer: Self-pay

## 2022-10-13 ENCOUNTER — Other Ambulatory Visit (HOSPITAL_COMMUNITY): Payer: Self-pay

## 2022-10-20 ENCOUNTER — Other Ambulatory Visit (HOSPITAL_COMMUNITY): Payer: Self-pay

## 2022-10-21 ENCOUNTER — Other Ambulatory Visit: Payer: Self-pay

## 2022-10-23 DIAGNOSIS — J069 Acute upper respiratory infection, unspecified: Secondary | ICD-10-CM | POA: Diagnosis not present

## 2022-10-23 DIAGNOSIS — Z6829 Body mass index (BMI) 29.0-29.9, adult: Secondary | ICD-10-CM | POA: Diagnosis not present

## 2022-10-23 DIAGNOSIS — I10 Essential (primary) hypertension: Secondary | ICD-10-CM | POA: Diagnosis not present

## 2022-10-27 ENCOUNTER — Other Ambulatory Visit (HOSPITAL_COMMUNITY): Payer: Self-pay

## 2022-10-30 ENCOUNTER — Other Ambulatory Visit (HOSPITAL_COMMUNITY): Payer: Self-pay

## 2022-10-30 MED ORDER — MELOXICAM 7.5 MG PO TABS
7.5000 mg | ORAL_TABLET | Freq: Every day | ORAL | 1 refills | Status: AC
  Filled 2022-10-30: qty 90, 90d supply, fill #0

## 2022-11-12 ENCOUNTER — Other Ambulatory Visit (HOSPITAL_COMMUNITY): Payer: Self-pay

## 2022-12-18 ENCOUNTER — Other Ambulatory Visit (HOSPITAL_COMMUNITY): Payer: Self-pay

## 2023-03-03 IMAGING — CR DG KNEE COMPLETE 4+V*L*
4 series · 4 of 4 positions shown · non-contrast
Comparison: Left knee series 08/15/2020.

CLINICAL DATA: 70-year-old female status post fall last night with
pain and swelling.

EXAM:
LEFT KNEE - COMPLETE 4+ VIEW

[t knee ap left]
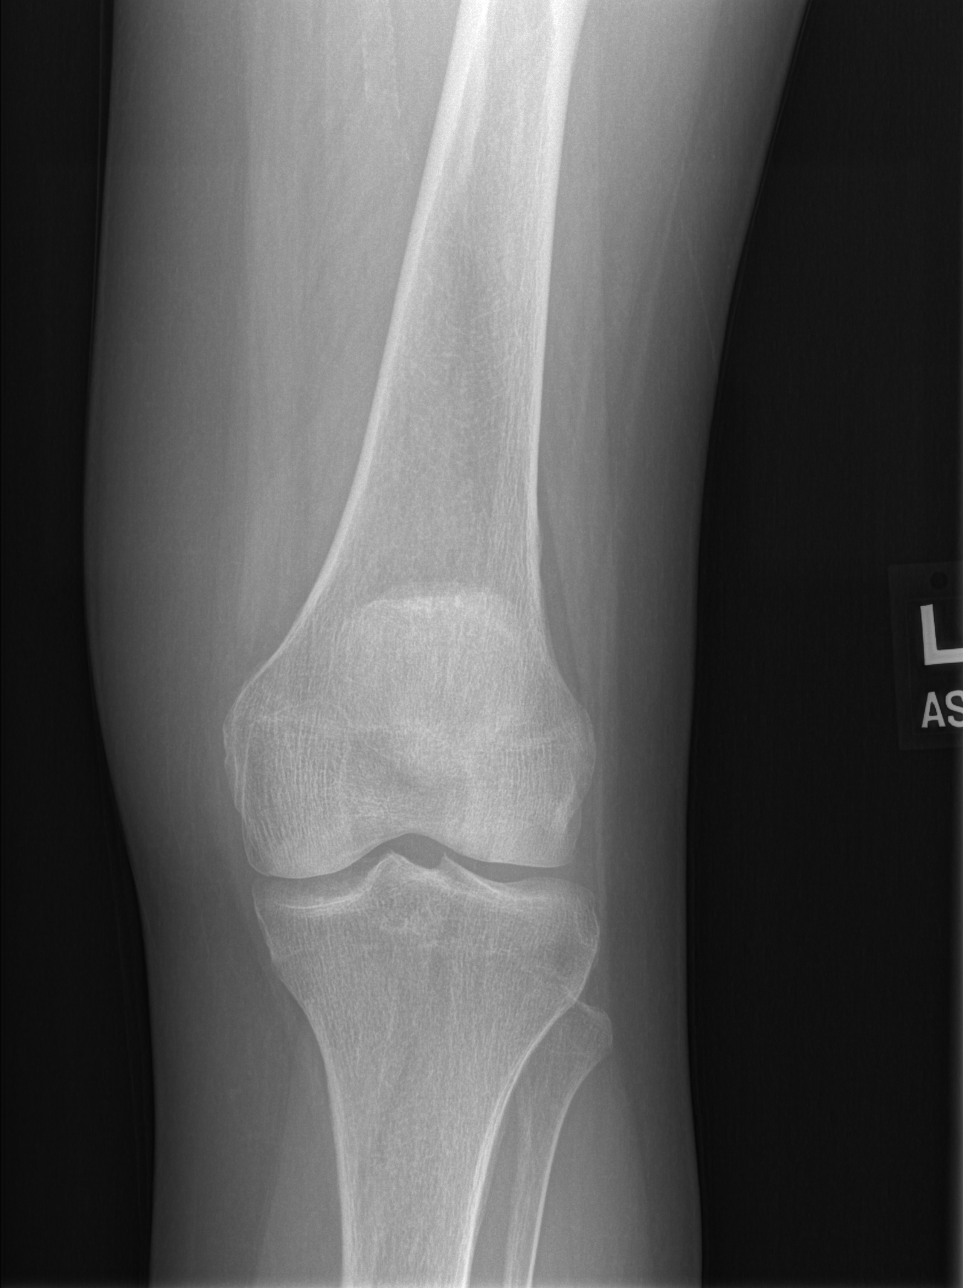

[t knee oblique left (1 of 2)]
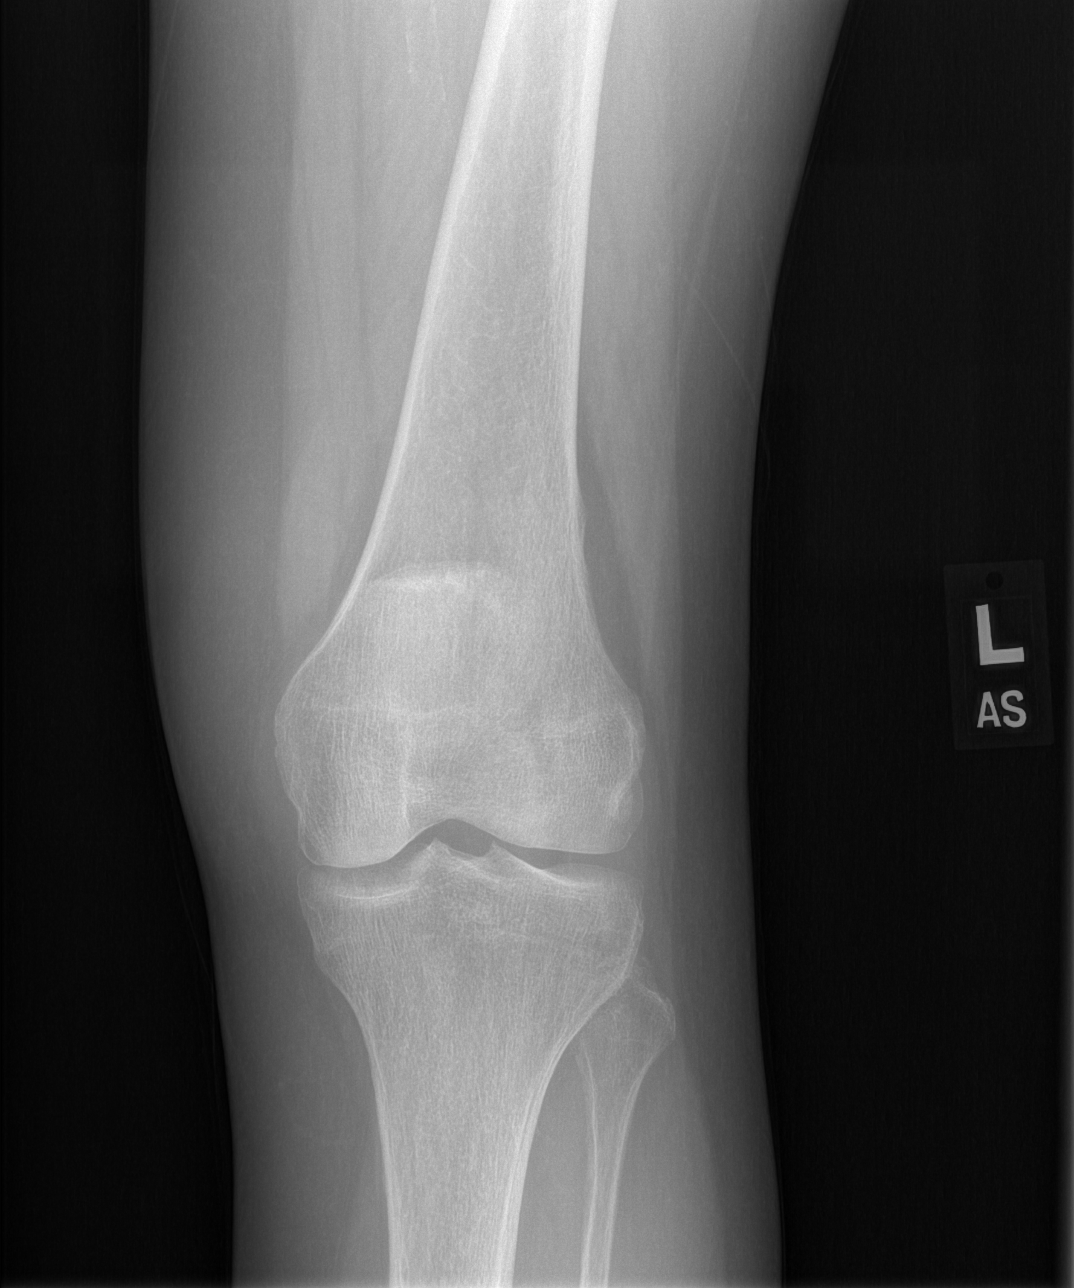

[t knee oblique left (2 of 2)]
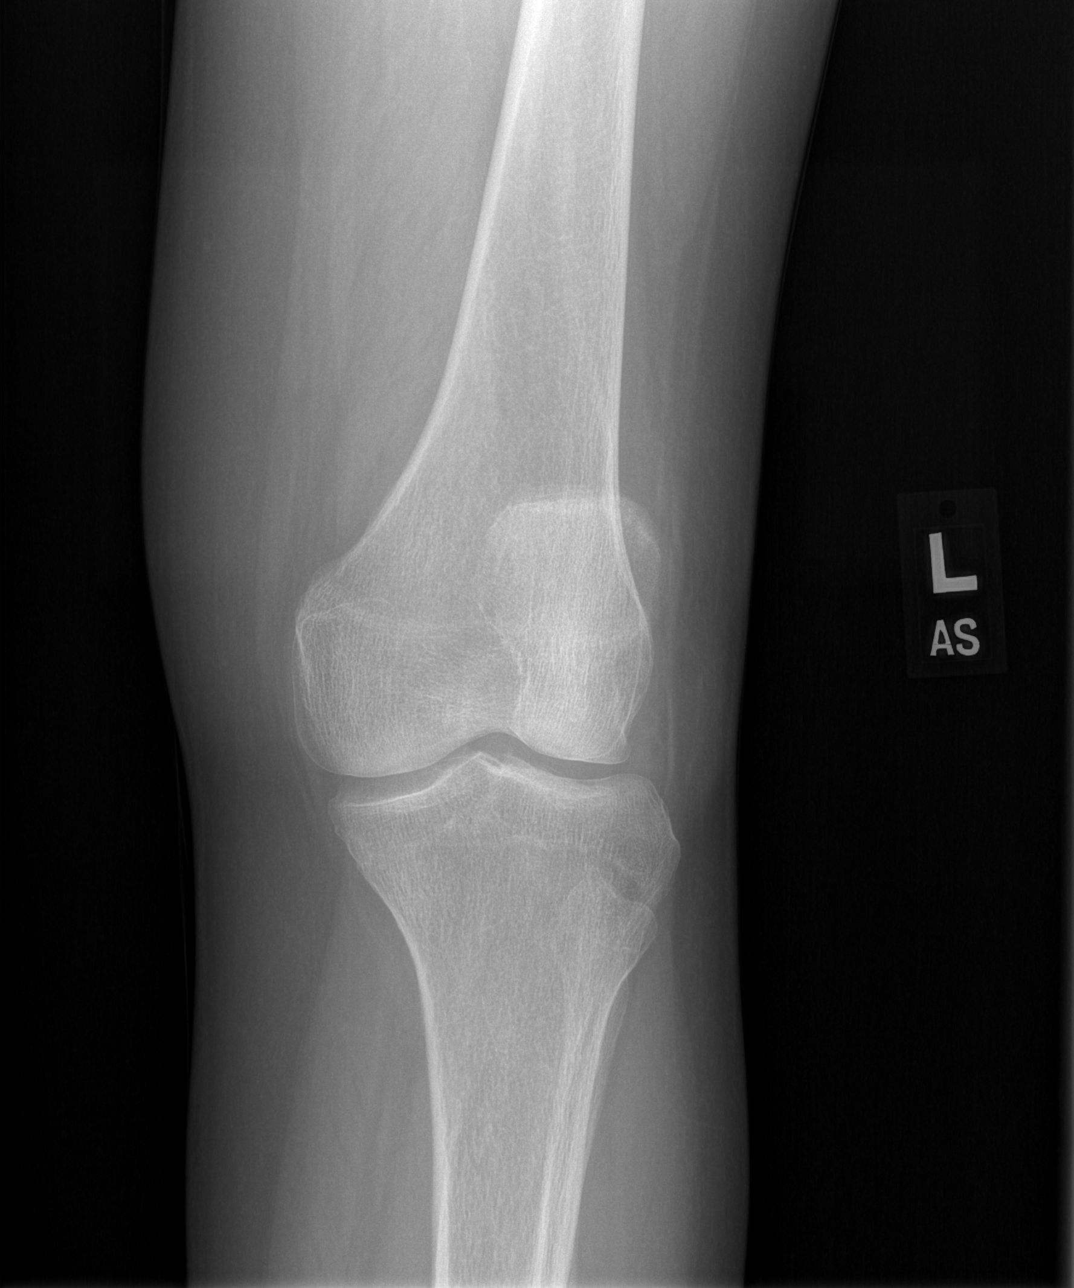

[t knee lat left]
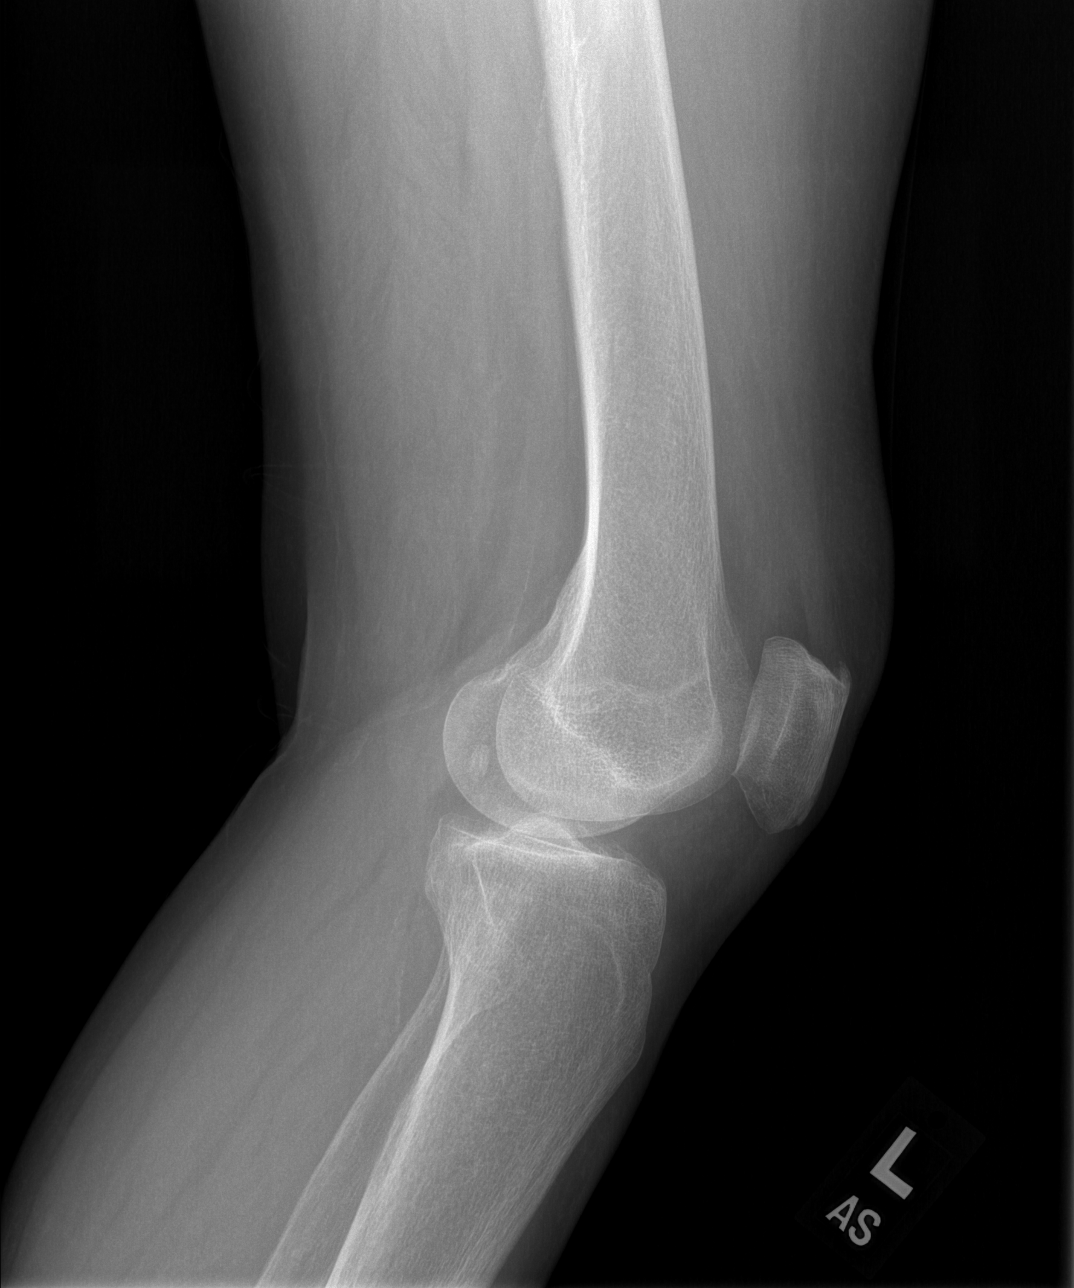

[4 of 4 positions shown; findings below may reference images not displayed]

FINDINGS: Bone mineralization is within normal limits. No evidence of
fracture, dislocation, or joint effusion. Minimal for age knee joint
degeneration. No acute osseous abnormality identified. No discrete
soft tissue injury. Mild vascular calcifications.
IMPRESSION: No acute osseous abnormality identified. Mild calcified peripheral
vascular disease.

## 2023-03-10 ENCOUNTER — Telehealth: Payer: Self-pay | Admitting: Student

## 2023-03-10 IMAGING — DX DG CHEST 1V PORT
1 series · 1 of 1 positions shown · non-contrast
Comparison: None.

CLINICAL DATA: Syncopal episode.

EXAM:
PORTABLE CHEST 1 VIEW

[chest ap]
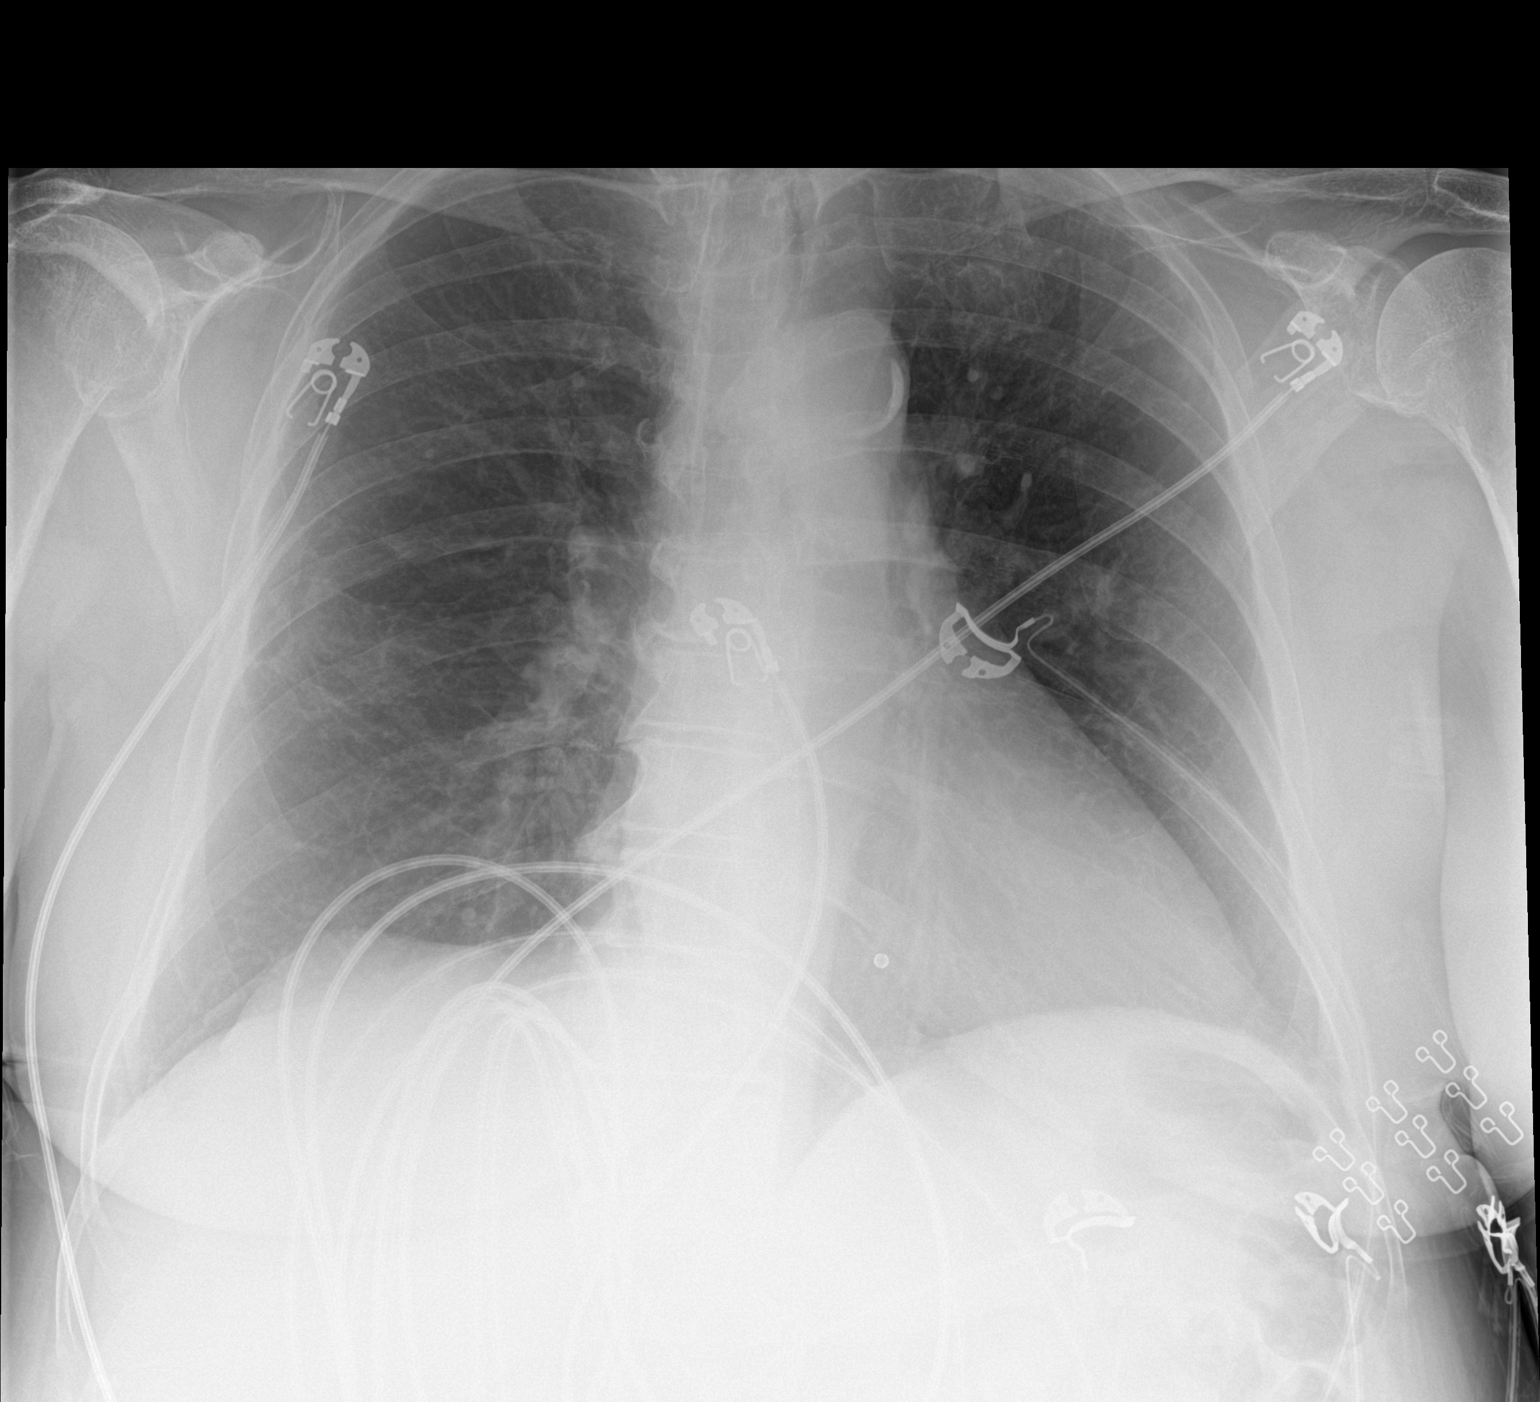

[1 of 1 positions shown; findings below may reference images not displayed]

FINDINGS: The heart size and mediastinal contours are within normal limits.
There is moderate severity calcification of the aortic arch. Both
lungs are clear. A round radiopaque calcification is seen overlying
the medial aspect of the left lung base. The visualized skeletal
structures are unremarkable.
IMPRESSION: No active cardiopulmonary disease.

## 2023-03-10 NOTE — Telephone Encounter (Signed)
Called patient to schedule Medicare Annual Wellness Visit (AWV). Left message for patient to call back and schedule Medicare Annual Wellness Visit (AWV).  Last date of AWV:  AWVI eligible as of 11/02/2009  Please schedule an AWVI appointment at any time with [facility] ANNUAL WELLNESS VISIT.  If any questions, please contact me at [phone].    Thank you,  Kadance Mccuistion  [facility] Support [facility] Direct dial  [phone]   

## 2024-04-11 ENCOUNTER — Encounter: Payer: Self-pay | Admitting: *Deleted
